# Patient Record
Sex: Female | Born: 1955 | Race: Black or African American | Hispanic: No | Marital: Single | Smoking: Former smoker
Health system: Southern US, Community
[De-identification: ages and names within clinical notes are randomized; demographics above are authoritative.]

## PROBLEM LIST (undated history)

## (undated) DIAGNOSIS — F329 Major depressive disorder, single episode, unspecified: Secondary | ICD-10-CM

## (undated) DIAGNOSIS — E7800 Pure hypercholesterolemia, unspecified: Secondary | ICD-10-CM

## (undated) DIAGNOSIS — I10 Essential (primary) hypertension: Secondary | ICD-10-CM

## (undated) DIAGNOSIS — R109 Unspecified abdominal pain: Secondary | ICD-10-CM

## (undated) DIAGNOSIS — G35D Multiple sclerosis, unspecified: Secondary | ICD-10-CM

## (undated) DIAGNOSIS — F32A Depression, unspecified: Secondary | ICD-10-CM

## (undated) DIAGNOSIS — G35 Multiple sclerosis: Secondary | ICD-10-CM

## (undated) HISTORY — DX: Essential (primary) hypertension: I10

## (undated) HISTORY — DX: Multiple sclerosis: G35

## (undated) HISTORY — DX: Depression, unspecified: F32.A

## (undated) HISTORY — DX: Pure hypercholesterolemia, unspecified: E78.00

## (undated) HISTORY — DX: Major depressive disorder, single episode, unspecified: F32.9

## (undated) HISTORY — DX: Multiple sclerosis, unspecified: G35.D

---

## 1996-11-25 HISTORY — PX: BREAST REDUCTION SURGERY: SHX8

## 1998-03-10 ENCOUNTER — Ambulatory Visit (HOSPITAL_COMMUNITY): Admission: RE | Admit: 1998-03-10 | Discharge: 1998-03-10 | Payer: Self-pay | Admitting: *Deleted

## 1999-05-17 ENCOUNTER — Emergency Department (HOSPITAL_COMMUNITY): Admission: EM | Admit: 1999-05-17 | Discharge: 1999-05-17 | Payer: Self-pay | Admitting: Emergency Medicine

## 1999-11-04 ENCOUNTER — Emergency Department (HOSPITAL_COMMUNITY): Admission: EM | Admit: 1999-11-04 | Discharge: 1999-11-04 | Payer: Self-pay | Admitting: Emergency Medicine

## 1999-11-15 ENCOUNTER — Encounter: Payer: Self-pay | Admitting: *Deleted

## 1999-11-15 ENCOUNTER — Ambulatory Visit (HOSPITAL_COMMUNITY): Admission: RE | Admit: 1999-11-15 | Discharge: 1999-11-15 | Payer: Self-pay | Admitting: *Deleted

## 2000-06-30 ENCOUNTER — Ambulatory Visit (HOSPITAL_COMMUNITY): Admission: RE | Admit: 2000-06-30 | Discharge: 2000-06-30 | Payer: Self-pay | Admitting: Internal Medicine

## 2000-07-30 ENCOUNTER — Ambulatory Visit (HOSPITAL_COMMUNITY): Admission: RE | Admit: 2000-07-30 | Discharge: 2000-07-30 | Payer: Self-pay | Admitting: Gastroenterology

## 2001-07-03 ENCOUNTER — Encounter: Payer: Self-pay | Admitting: Internal Medicine

## 2001-07-03 ENCOUNTER — Ambulatory Visit (HOSPITAL_COMMUNITY): Admission: RE | Admit: 2001-07-03 | Discharge: 2001-07-03 | Payer: Self-pay | Admitting: Internal Medicine

## 2001-07-21 ENCOUNTER — Other Ambulatory Visit: Admission: RE | Admit: 2001-07-21 | Discharge: 2001-07-21 | Payer: Self-pay | Admitting: Internal Medicine

## 2002-08-03 ENCOUNTER — Encounter: Admission: RE | Admit: 2002-08-03 | Discharge: 2002-08-25 | Payer: Self-pay | Admitting: Neurology

## 2003-06-30 ENCOUNTER — Other Ambulatory Visit: Admission: RE | Admit: 2003-06-30 | Discharge: 2003-06-30 | Payer: Self-pay | Admitting: Obstetrics and Gynecology

## 2005-05-20 ENCOUNTER — Ambulatory Visit (HOSPITAL_COMMUNITY): Admission: RE | Admit: 2005-05-20 | Discharge: 2005-05-20 | Payer: Self-pay

## 2005-07-18 ENCOUNTER — Encounter: Admission: RE | Admit: 2005-07-18 | Discharge: 2005-10-16 | Payer: Self-pay | Admitting: Neurology

## 2007-11-10 ENCOUNTER — Ambulatory Visit: Payer: Self-pay | Admitting: Psychology

## 2007-11-10 ENCOUNTER — Encounter: Admission: RE | Admit: 2007-11-10 | Discharge: 2007-11-10 | Payer: Self-pay | Admitting: Neurology

## 2008-01-13 ENCOUNTER — Ambulatory Visit: Payer: Self-pay | Admitting: Vascular Surgery

## 2011-04-05 ENCOUNTER — Other Ambulatory Visit: Payer: Self-pay | Admitting: Gastroenterology

## 2011-04-05 DIAGNOSIS — Q438 Other specified congenital malformations of intestine: Secondary | ICD-10-CM

## 2011-04-10 ENCOUNTER — Other Ambulatory Visit: Payer: Self-pay | Admitting: Gastroenterology

## 2011-04-10 ENCOUNTER — Ambulatory Visit
Admission: RE | Admit: 2011-04-10 | Discharge: 2011-04-10 | Disposition: A | Discharge status: Home / Self Care | Payer: Medicare Other | Source: Ambulatory Visit | Attending: Gastroenterology | Admitting: Gastroenterology

## 2011-04-10 DIAGNOSIS — Q438 Other specified congenital malformations of intestine: Secondary | ICD-10-CM

## 2011-04-12 NOTE — Procedures (Signed)
[place]. [facility]  Patient:    Mary Villanueva, Mary Villanueva                  MRN: [id number] Proc. Date: 07/30/00 Adm. Date:  [id number] Attending:  Charna Elizabeth CC:         Kern Reap, M.D.   Procedure Report  DATE OF BIRTH:  [birth year]  REFERRING PHYSICIAN:  Kern Reap, M.D.  PROCEDURE PERFORMED:  Esophagogastroduodenoscopy.  ENDOSCOPIST:  Anselmo Rod, M.D.  INSTRUMENT USED:  Olympus video panendoscope.  INDICATIONS FOR PROCEDURE:  Guaiac positive stool in a 55 year old black female rule out peptic ulcer disease, esophagitis, gastritis, etc.  PREPROCEDURE PREPARATION:  Informed consent was procured from the patient. The patient was fasted for eight hours prior to the procedure.  PREPROCEDURE PHYSICAL:  The patient had stable vital signs.  Neck supple. Chest clear to auscultation.  S1, S2 regular.  Abdomen soft with normal abdominal bowel sounds.  DESCRIPTION OF PROCEDURE:  The patient was placed in left lateral decubitus position and sedated with 50 mg of Demerol and 5 mg of Versed intravenously. Once the patient was adequately sedated and maintained on low-flow oxygen and continuous cardiac monitoring, the Olympus video panendoscope was advanced through the mouthpiece, over the tongue, into the esophagus under direct vision.  The entire esophagus appeared normal without evidence of ring, stricture, masses, lesions, esophagitis or Barretts mucosa.  The scope was then advanced into the stomach.  Except for mild antral gastritis and mild duodenitis in the duodenal bulb, no other abnormalities were seen.  The patient tolerated the procedure well without complications.  IMPRESSION:  Normal esophagogastroduodenoscopy except for mild antral gastritis and mild duodenitis in the duodenal bulb.  RECOMMENDATION: 1. Proceed with colonoscopy at this time. 2. Avoid all nonsteroidals. DD:  07/30/00 TD:  07/31/00 Job: [id number]

## 2011-04-12 NOTE — Procedures (Signed)
[place]. [facility]  Patient:    Mary Villanueva, Mary Villanueva                  MRN: [id number] Proc. Date: 07/30/00 Adm. Date:  [id number] Attending:  Charna Elizabeth CC:         Kern Reap, M.D.   Procedure Report  DATE OF BIRTH:  [birth year]  REFERRING PHYSICIAN:  Kern Reap, M.D.  PROCEDURE PERFORMED:  Colonoscopy.  ENDOSCOPIST:  Anselmo Rod, M.D.  INSTRUMENT USED:  Olympus video colonoscope.  INDICATIONS FOR PROCEDURE:  Guaiac positive stool in a 55 year old black female rule out colonic polyps, masses, hemorrhoids, etc.  PREPROCEDURE PREPARATION:  Informed consent was procured from the patient. The patient was fasted for eight hours prior to the procedure and prepped with a bottle of magnesium citrate and a gallon of NuLytely the night prior to the procedure.  PREPROCEDURE PHYSICAL:  The patient had stable vital signs.  Neck supple. Chest clear to auscultation.  S1, S2 regular.  No murmur, rub or gallop.  No rales, rhonchi or wheezing.  Abdomen soft with normal abdominal bowel sounds.  DESCRIPTION OF PROCEDURE:  The patient was placed in the left lateral decubitus position and sedated with an additional 1 mg of Versed intravenously.  Once the patient was adequately sedated and maintained on low-flow oxygen and continuous cardiac monitoring, the Olympus video colonoscope was advanced from the rectum to the cecum without difficulty.  No masses, polyps, erosions, ulcerations or diverticula were seen.  The patient had no evidence of hemorrhoids on retroflexion.  The patient tolerated the procedure well without complications.  The procedure was ____________ cecum. The ileocecal valve and the appendicular orifice were clearly visualized.  IMPRESSION:  Normal colonoscopy.  RECOMMENDATIONS:  Repeat guaiac testing will be done on an outpatient basis. Further recommendations will be made depending on results.  A small bowel follow-through  will be done if patient continues to have guaiac positive stools.DD:  07/30/00 TD:  07/31/00 Job: [id number]

## 2011-08-19 ENCOUNTER — Ambulatory Visit: Payer: Medicare Other | Attending: Neurology | Admitting: Physical Therapy

## 2011-08-19 DIAGNOSIS — IMO0001 Reserved for inherently not codable concepts without codable children: Secondary | ICD-10-CM | POA: Insufficient documentation

## 2011-08-19 DIAGNOSIS — R269 Unspecified abnormalities of gait and mobility: Secondary | ICD-10-CM | POA: Insufficient documentation

## 2011-08-19 DIAGNOSIS — M6281 Muscle weakness (generalized): Secondary | ICD-10-CM | POA: Insufficient documentation

## 2011-08-26 ENCOUNTER — Ambulatory Visit: Payer: Medicare Other | Attending: Neurology | Admitting: Physical Therapy

## 2011-08-26 DIAGNOSIS — R269 Unspecified abnormalities of gait and mobility: Secondary | ICD-10-CM | POA: Insufficient documentation

## 2011-08-26 DIAGNOSIS — M6281 Muscle weakness (generalized): Secondary | ICD-10-CM | POA: Insufficient documentation

## 2011-08-26 DIAGNOSIS — IMO0001 Reserved for inherently not codable concepts without codable children: Secondary | ICD-10-CM | POA: Insufficient documentation

## 2011-08-30 ENCOUNTER — Ambulatory Visit: Payer: Medicare Other | Admitting: Physical Therapy

## 2011-09-03 ENCOUNTER — Ambulatory Visit: Payer: Medicare Other | Admitting: Physical Therapy

## 2011-09-06 ENCOUNTER — Ambulatory Visit: Payer: Medicare Other | Admitting: Physical Therapy

## 2011-09-11 ENCOUNTER — Ambulatory Visit: Payer: Medicare Other | Admitting: Physical Therapy

## 2011-09-18 ENCOUNTER — Ambulatory Visit: Payer: Medicare Other | Admitting: Physical Therapy

## 2013-03-10 ENCOUNTER — Other Ambulatory Visit: Payer: Self-pay

## 2013-03-10 MED ORDER — FLUOXETINE HCL 20 MG PO CAPS: 20.0000 mg | ORAL_CAPSULE | Freq: Every day | ORAL | Status: DC

## 2013-03-10 MED ORDER — TIZANIDINE HCL 2 MG PO TABS: 2.0000 mg | ORAL_TABLET | Freq: Three times a day (TID) | ORAL | Status: DC

## 2013-03-10 MED ORDER — FLUOXETINE HCL 40 MG PO CAPS: 40.0000 mg | ORAL_CAPSULE | Freq: Every day | ORAL | Status: DC

## 2013-03-10 NOTE — Telephone Encounter (Signed)
Former [facility] patient assigned to Dr Yan  

## 2013-03-17 ENCOUNTER — Other Ambulatory Visit: Payer: Self-pay | Admitting: Nurse Practitioner

## 2013-03-17 DIAGNOSIS — R51 Headache: Secondary | ICD-10-CM

## 2013-03-19 ENCOUNTER — Ambulatory Visit: Payer: Self-pay | Admitting: Neurology

## 2013-03-20 ENCOUNTER — Ambulatory Visit
Admission: RE | Admit: 2013-03-20 | Discharge: 2013-03-20 | Disposition: A | Discharge status: Home / Self Care | Payer: Medicare Other | Source: Ambulatory Visit | Attending: Nurse Practitioner | Admitting: Nurse Practitioner

## 2013-03-20 DIAGNOSIS — R51 Headache: Secondary | ICD-10-CM

## 2013-03-22 ENCOUNTER — Ambulatory Visit (INDEPENDENT_AMBULATORY_CARE_PROVIDER_SITE_OTHER): Payer: Medicare Other | Admitting: Neurology

## 2013-03-22 ENCOUNTER — Encounter: Payer: Self-pay | Admitting: Neurology

## 2013-03-22 VITALS — BP 109/53 | HR 82 | Ht 62.0 in | Wt 143.4 lb

## 2013-03-22 DIAGNOSIS — G35D Multiple sclerosis, unspecified: Secondary | ICD-10-CM | POA: Insufficient documentation

## 2013-03-22 DIAGNOSIS — G35 Multiple sclerosis: Secondary | ICD-10-CM | POA: Insufficient documentation
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
## 2013-04-13 ENCOUNTER — Encounter (HOSPITAL_COMMUNITY): Payer: Self-pay | Admitting: Emergency Medicine

## 2013-04-13 ENCOUNTER — Emergency Department (HOSPITAL_COMMUNITY)
Admission: EM | Admit: 2013-04-13 | Discharge: 2013-04-13 | Disposition: A | Discharge status: Home / Self Care | Payer: Medicare Other | Attending: Emergency Medicine | Admitting: Emergency Medicine

## 2013-04-13 DIAGNOSIS — I10 Essential (primary) hypertension: Secondary | ICD-10-CM | POA: Insufficient documentation

## 2013-04-13 DIAGNOSIS — E7800 Pure hypercholesterolemia, unspecified: Secondary | ICD-10-CM | POA: Insufficient documentation

## 2013-04-13 DIAGNOSIS — Z79899 Other long term (current) drug therapy: Secondary | ICD-10-CM | POA: Insufficient documentation

## 2013-04-13 DIAGNOSIS — M79605 Pain in left leg: Secondary | ICD-10-CM

## 2013-04-13 DIAGNOSIS — IMO0001 Reserved for inherently not codable concepts without codable children: Secondary | ICD-10-CM | POA: Insufficient documentation

## 2013-04-13 DIAGNOSIS — M7989 Other specified soft tissue disorders: Secondary | ICD-10-CM | POA: Insufficient documentation

## 2013-04-13 DIAGNOSIS — M79609 Pain in unspecified limb: Secondary | ICD-10-CM | POA: Insufficient documentation

## 2013-04-13 DIAGNOSIS — Z87891 Personal history of nicotine dependence: Secondary | ICD-10-CM | POA: Insufficient documentation

## 2013-04-13 DIAGNOSIS — F3289 Other specified depressive episodes: Secondary | ICD-10-CM | POA: Insufficient documentation

## 2013-04-13 DIAGNOSIS — F329 Major depressive disorder, single episode, unspecified: Secondary | ICD-10-CM | POA: Insufficient documentation

## 2013-04-13 HISTORY — DX: Pure hypercholesterolemia, unspecified: E78.00

## 2013-04-13 LAB — BASIC METABOLIC PANEL
CO2: 26 mEq/L (ref 19–32)
GFR calc non Af Amer: 55 mL/min — ABNORMAL LOW (ref >90)
Glucose, Bld: 99 mg/dL (ref 70–99)
Potassium: 4.1 mEq/L (ref 3.5–5.1)
Sodium: 138 mEq/L (ref 135–145)

## 2013-04-13 LAB — CBC WITH DIFFERENTIAL/PLATELET
Lymphocytes Relative: 25 % (ref 12–46)
Lymphs Abs: 1 10*3/uL (ref 0.7–4.0)
Neutrophils Relative %: 67 % (ref 43–77)
Platelets: 219 10*3/uL (ref 150–400)
RBC: 3.74 MIL/uL — ABNORMAL LOW (ref 3.87–5.11)
WBC: 4.2 10*3/uL (ref 4.0–10.5)

## 2013-04-13 NOTE — Progress Notes (Signed)
*  Preliminary Results* Left lower extremity venous duplex completed. Left lower extremity is negative for deep vein thrombosis. There is no evidence of left Baker's cyst.  04/13/2013 11:34 AM Gertie Fey, RDMS, RDCS

## 2013-04-13 NOTE — ED Provider Notes (Signed)
History     CSN: [id number]  Arrival date & time 04/13/13  0901   First MD Initiated Contact with Patient 04/13/13 [phone]      Chief Complaint  Patient presents with  . Leg Pain  . Leg Swelling    (Consider location/radiation/quality/duration/timing/severity/associated sxs/prior treatment) HPI Comments: Patient is a 57 year old female with a past medical history of MS who presents with a 3 day history of left leg pain, mostly in her left calf. Symptoms started gradually and progressively worsened since the onset. The pain is cramping and severe without radiation. Patient reports leg spasms in the affected leg since the onset. No aggravating/alleviating factors. Patient denies any leg trauma, recent surgery, recent travel or history of DVT/PE.    Past Medical History  Diagnosis Date  . Hypertension   . Depression   . High cholesterol   . Hypercholesteremia     Past Surgical History  Procedure Laterality Date  . Breast reduction surgery  1998    Family History  Problem Relation Age of Onset  . Stroke Mother     History  Substance Use Topics  . Smoking status: Former Games developer  . Smokeless tobacco: Never Used  . Alcohol Use: Yes     Comment: ralely    OB History   Grav Para Term Preterm Abortions TAB SAB Ect Mult Living                  Review of Systems  Musculoskeletal: Positive for myalgias.  All other systems reviewed and are negative.    Allergies  Review of patient's allergies indicates no known allergies.  Home Medications   Current Outpatient Rx  Name  Route  Sig  Dispense  Refill  . amLODipine (NORVASC) 2.5 MG tablet   Oral   Take 2.5 mg by mouth daily.         Marland Kitchen buPROPion (WELLBUTRIN) 75 MG tablet   Oral   Take 75 mg by mouth daily.         . calcium carbonate (OS-CAL) 600 MG TABS   Oral   Take 600 mg by mouth 2 (two) times daily with a meal.         . Cholecalciferol (VITAMIN D-3) 1000 UNITS CAPS   Oral   Take 1,000 Units by mouth  2 (two) times daily.         Marland Kitchen darifenacin (ENABLEX) 15 MG 24 hr tablet   Oral   Take 15 mg by mouth daily.         Marland Kitchen FLUoxetine (PROZAC) 20 MG capsule   Oral   Take 1 capsule (20 mg total) by mouth daily. With 40mg    90 capsule   1   . FLUoxetine (PROZAC) 40 MG capsule   Oral   Take 1 capsule (40 mg total) by mouth daily. With 20mg    90 capsule   1   . levothyroxine (SYNTHROID) 25 MCG tablet   Oral   Take 25 mcg by mouth daily before breakfast.         . Multiple Vitamin (MULTIVITAMIN) capsule   Oral   Take 1 capsule by mouth daily.         . Omega-3 Fatty Acids (FISH OIL) 1200 MG CAPS   Oral   Take 1,200 mg by mouth daily.         . simvastatin (ZOCOR) 20 MG tablet   Oral   Take 20 mg by mouth every evening.         Marland Kitchen  solifenacin (VESICARE) 10 MG tablet   Oral   Take 10 mg by mouth daily.         Marland Kitchen tiZANidine (ZANAFLEX) 2 MG tablet   Oral   Take 1 tablet (2 mg total) by mouth 3 (three) times daily.   270 tablet   1   . valsartan (DIOVAN) 320 MG tablet   Oral   Take 320 mg by mouth daily.         . vitamin B-12 (CYANOCOBALAMIN) 500 MCG tablet   Oral   Take 500 mcg by mouth daily.           BP 95/41  Pulse 90  Temp(Src) 98.5 F (36.9 C) (Oral)  Resp 18  Ht 5\' 2"  (1.575 m)  Wt 140 lb (63.504 kg)  BMI 25.6 kg/m2  SpO2 95%  Physical Exam  Nursing note and vitals reviewed. Constitutional: She is oriented to person, place, and time. She appears well-developed and well-nourished. No distress.  HENT:  Head: Normocephalic and atraumatic.  Eyes: Conjunctivae are normal.  Neck: Normal range of motion.  Cardiovascular: Normal rate and regular rhythm.  Exam reveals no gallop and no friction rub.   No murmur heard. Pulmonary/Chest: Effort normal and breath sounds normal. She has no wheezes. She has no rales. She exhibits no tenderness.  Abdominal: Soft. She exhibits no distension. There is no tenderness. There is no rebound and no  guarding.  Musculoskeletal: Normal range of motion.  Left lower leg 1+ pitting edema. Dark discoloration of left lower leg. No calf or popliteal tenderness to palpation.   Neurological: She is alert and oriented to person, place, and time. Coordination normal.  Extremity strength and sensation equal and intact bilaterally. Speech is goal-oriented. Moves limbs without ataxia.   Skin: Skin is warm and dry.  Psychiatric: She has a normal mood and affect. Her behavior is normal.    ED Course  Procedures (including critical care time)  Labs Reviewed  CBC WITH DIFFERENTIAL - Abnormal; Notable for the following:    RBC 3.74 (*)    Hemoglobin 11.8 (*)    HCT 34.1 (*)    All other components within normal limits  BASIC METABOLIC PANEL - Abnormal; Notable for the following:    GFR calc non Af Amer 55 (*)    GFR calc Af Amer 63 (*)    All other components within normal limits   No results found.   1. Left leg pain       MDM  10:25 AM Labs pending. Patient will have LE DVT study. Patient declines pain medication at this time.   1:45 PM Labs unremarkable for acute changes. Vascular study unremarkable for DVT. Patient will be discharged with instructions to follow up with her neurologist for further evaluation and management. Vitals stable and patient is afebrile. Patient instructed to return with worsening or concerning symptoms.        Emilia Beck, PA-C 04/13/13 1348

## 2013-04-13 NOTE — ED Notes (Signed)
Family at bedside. 

## 2013-04-13 NOTE — ED Notes (Signed)
Pt c/o left leg pain and swelling from spasms; pt sts hx of MS and has spasms with that

## 2013-04-14 NOTE — ED Provider Notes (Signed)
Medical screening examination/treatment/procedure(s) were conducted as a shared visit with non-physician practitioner(s) and myself.  I personally evaluated the patient during the encounter Pt with hx MS c/o left leg spasms, intermittently. ?slight swelling. No erythema. No focal bony tenderness. Distal pulses palp.   Suzi Roots, MD 04/14/13 1344

## 2013-04-21 ENCOUNTER — Ambulatory Visit (INDEPENDENT_AMBULATORY_CARE_PROVIDER_SITE_OTHER): Payer: Medicare Other | Admitting: Neurology

## 2013-04-21 ENCOUNTER — Encounter: Payer: Self-pay | Admitting: Neurology

## 2013-04-21 VITALS — Ht 62.0 in | Wt 139.0 lb

## 2013-04-21 DIAGNOSIS — G35 Multiple sclerosis: Secondary | ICD-10-CM

## 2013-04-21 DIAGNOSIS — F329 Major depressive disorder, single episode, unspecified: Secondary | ICD-10-CM | POA: Insufficient documentation

## 2013-04-21 DIAGNOSIS — E7800 Pure hypercholesterolemia, unspecified: Secondary | ICD-10-CM

## 2013-04-21 DIAGNOSIS — I10 Essential (primary) hypertension: Secondary | ICD-10-CM

## 2013-04-22 DIAGNOSIS — G8220 Paraplegia, unspecified: Secondary | ICD-10-CM

## 2013-04-22 MED ORDER — ONABOTULINUMTOXINA 100 UNITS IJ SOLR
250.0000 [IU] | Freq: Once | INTRAMUSCULAR | Status: AC
  Administered 2013-04-22: 250 [IU] via INTRAMUSCULAR
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
## 2013-07-01 DIAGNOSIS — Z0289 Encounter for other administrative examinations: Secondary | ICD-10-CM

## 2013-07-21 ENCOUNTER — Telehealth: Payer: Self-pay

## 2013-07-21 NOTE — Telephone Encounter (Signed)
I called patient and reviewed document with her. We discussed all areas of functional capabilities for correctness. I thanked patient for her time and let her know, Dr. Terrace Arabia will review and sign form tomorrow and then we will fax it out when that is done.

## 2013-07-28 ENCOUNTER — Encounter: Payer: Self-pay | Admitting: Neurology

## 2013-07-28 ENCOUNTER — Ambulatory Visit (INDEPENDENT_AMBULATORY_CARE_PROVIDER_SITE_OTHER): Payer: Medicare Other | Admitting: Neurology

## 2013-07-28 VITALS — BP 101/62 | HR 90 | Ht 62.0 in | Wt 145.0 lb

## 2013-07-28 DIAGNOSIS — G8220 Paraplegia, unspecified: Secondary | ICD-10-CM

## 2013-07-28 DIAGNOSIS — G35 Multiple sclerosis: Secondary | ICD-10-CM

## 2013-07-28 MED ORDER — ONABOTULINUMTOXINA 100 UNITS IJ SOLR
250.0000 [IU] | Freq: Once | INTRAMUSCULAR | Status: AC
  Administered 2013-07-28: 250 [IU] via INTRAMUSCULAR
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
## 2013-08-16 ENCOUNTER — Ambulatory Visit: Payer: Medicare Other | Admitting: Neurology

## 2013-08-27 ENCOUNTER — Other Ambulatory Visit: Payer: Self-pay | Admitting: Neurology

## 2013-09-19 ENCOUNTER — Other Ambulatory Visit: Payer: Self-pay | Admitting: Neurology

## 2013-11-03 ENCOUNTER — Encounter (INDEPENDENT_AMBULATORY_CARE_PROVIDER_SITE_OTHER): Payer: Self-pay

## 2013-11-03 ENCOUNTER — Encounter: Payer: Self-pay | Admitting: Neurology

## 2013-11-03 ENCOUNTER — Ambulatory Visit (INDEPENDENT_AMBULATORY_CARE_PROVIDER_SITE_OTHER): Payer: Medicare Other | Admitting: Neurology

## 2013-11-03 VITALS — BP 113/58 | HR 95 | Ht 62.0 in | Wt 144.5 lb

## 2013-11-03 DIAGNOSIS — G8220 Paraplegia, unspecified: Secondary | ICD-10-CM

## 2013-11-03 DIAGNOSIS — G35 Multiple sclerosis: Secondary | ICD-10-CM

## 2013-11-03 MED ORDER — ONABOTULINUMTOXINA 100 UNITS IJ SOLR
400.0000 [IU] | Freq: Once | INTRAMUSCULAR | Status: AC
  Administered 2013-11-03: 400 [IU] via INTRAMUSCULAR

## 2013-11-03 MED ORDER — TIZANIDINE HCL 4 MG PO TABS: 4.0000 mg | ORAL_TABLET | Freq: Three times a day (TID) | ORAL | Status: DC

## 2013-11-03 NOTE — Addendum Note (Signed)
Addended by: Levert Feinstein on: 11/03/2013 03:59 PM   Modules accepted: Orders
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
## 2013-11-30 ENCOUNTER — Telehealth: Payer: Self-pay | Admitting: Neurology

## 2013-11-30 DIAGNOSIS — E7800 Pure hypercholesterolemia, unspecified: Secondary | ICD-10-CM

## 2013-11-30 DIAGNOSIS — I10 Essential (primary) hypertension: Secondary | ICD-10-CM

## 2013-11-30 DIAGNOSIS — G35 Multiple sclerosis: Secondary | ICD-10-CM

## 2013-11-30 NOTE — Telephone Encounter (Signed)
Patient is experiencing some weakness in her right foot. She wants to know if it is a side effect of the Botox and/or something she should be concerned over. Please call to advise. If calling tomorrow use this number [phone]

## 2013-12-01 NOTE — Telephone Encounter (Signed)
I have called her, she complains of worsening right leg weakness, gait difficulty since her most recent Botox injection for bilateral lower extremity spasticity, I did inject her right distal leg muscles, hoping to improve her right foot plantar flexion,  If her increased weakness is from Botox injection, wearing of 810 weeks after injection, which was performed December tenth 2014,  I will also refer her to home health physical therapy for gait training,

## 2013-12-01 NOTE — Telephone Encounter (Signed)
Left message for patient at [phone], to get further information.  Told her I would forward to the doctor for her advise.  [phone].

## 2013-12-27 ENCOUNTER — Encounter: Payer: Self-pay | Admitting: Podiatry

## 2013-12-27 ENCOUNTER — Ambulatory Visit (INDEPENDENT_AMBULATORY_CARE_PROVIDER_SITE_OTHER): Payer: Medicare Other | Admitting: Podiatry

## 2013-12-27 ENCOUNTER — Ambulatory Visit (INDEPENDENT_AMBULATORY_CARE_PROVIDER_SITE_OTHER): Payer: Medicare Other

## 2013-12-27 VITALS — BP 132/78 | HR 81 | Resp 18

## 2013-12-27 DIAGNOSIS — M79609 Pain in unspecified limb: Secondary | ICD-10-CM

## 2013-12-27 DIAGNOSIS — S93409A Sprain of unspecified ligament of unspecified ankle, initial encounter: Secondary | ICD-10-CM

## 2013-12-27 NOTE — Patient Instructions (Addendum)
We are the ankle stabilizer on the right ankle daily. Return x6 weeks for evaluation  Ankle Sprain An ankle sprain is an injury to the strong, fibrous tissues (ligaments) that hold the bones of your ankle joint together.  CAUSES An ankle sprain is usually caused by a fall or by twisting your ankle. Ankle sprains most commonly occur when you step on the outer edge of your foot, and your ankle turns inward. People who participate in sports are more prone to these types of injuries.  SYMPTOMS   Pain in your ankle. The pain may be present at rest or only when you are trying to stand or walk.  Swelling.  Bruising. Bruising may develop immediately or within 1 to 2 days after your injury.  Difficulty standing or walking, particularly when turning corners or changing directions. DIAGNOSIS  Your caregiver will ask you details about your injury and perform a physical exam of your ankle to determine if you have an ankle sprain. During the physical exam, your caregiver will press on and apply pressure to specific areas of your foot and ankle. Your caregiver will try to move your ankle in certain ways. An X-ray exam may be done to be sure a bone was not broken or a ligament did not separate from one of the bones in your ankle (avulsion fracture).  TREATMENT  Certain types of braces can help stabilize your ankle. Your caregiver can make a recommendation for this. Your caregiver may recommend the use of medicine for pain. If your sprain is severe, your caregiver may refer you to a surgeon who helps to restore function to parts of your skeletal system (orthopedist) or a physical therapist. [place] ice to your injury for 1 2 days or as directed by your caregiver. Applying ice helps to reduce inflammation and pain.  Put ice in a plastic bag.  Place a towel between your skin and the bag.  Leave the ice on for 15-20 minutes at a time, every 2 hours while you are awake.  Only take  over-the-counter or prescription medicines for pain, discomfort, or fever as directed by your caregiver.  Elevate your injured ankle above the level of your heart as much as possible for 2 3 days.  If your caregiver recommends crutches, use them as instructed. Gradually put weight on the affected ankle. Continue to use crutches or a cane until you can walk without feeling pain in your ankle.  If you have a plaster splint, wear the splint as directed by your caregiver. Do not rest it on anything harder than a pillow for the first 24 hours. Do not put weight on it. Do not get it wet. You may take it off to take a shower or bath.  You may have been given an elastic bandage to wear around your ankle to provide support. If the elastic bandage is too tight (you have numbness or tingling in your foot or your foot becomes cold and blue), adjust the bandage to make it comfortable.  If you have an air splint, you may blow more air into it or let air out to make it more comfortable. You may take your splint off at night and before taking a shower or bath. Wiggle your toes in the splint several times per day to decrease swelling. SEEK MEDICAL CARE IF:   You have rapidly increasing bruising or swelling.  Your toes feel extremely cold or you lose feeling in your foot.  Your  pain is not relieved with medicine. SEEK IMMEDIATE MEDICAL CARE IF:  Your toes are numb or blue.  You have severe pain that is increasing. MAKE SURE YOU:   Understand these instructions.  Will watch your condition.  Will get help right away if you are not doing well or get worse. Document Released: 11/11/2005 Document Revised: 08/05/2012 Document Reviewed: 11/23/2011 [facility] Patient Information 2014 [place], [place]. [email or web address]

## 2013-12-27 NOTE — Progress Notes (Signed)
   Subjective:    Patient ID: Mary Villanueva, female    DOB: March 24, 1956, 58 y.o.   MRN: [id number]  HPI my ankles are weak on both feet and left foot is swelling and been going on for about 3 weeks for the weakness and the swelling for months and no injury noted and I do have neuropathy in both feet.  Patient presents complaining of right ankle more than left ankle weakness with weightbearing. There is no history of direct injury. 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  Review of Systems  Cardiovascular:       Hyertension  Neurological: Positive for numbness.       Feet   All other systems reviewed and are negative.       Objective:   Physical Exam  A pleasant 58 year old black female presents today orientated x3.  Vascular: The DP and PT pulses are 2/bilaterally  Dermatological: Texture and turgor within normal limits.  Musculoskeletal: Patient has a gait disturbance walking with a walker. Dorsi flexion plantar flexion right ankle was route without restriction in the right foot functions within medium to low arch contour. There is palpable tenderness on the medial right ankle in the retro-medial malleolar area.  The left foot/ankle is in a cavovarus position. Left ankle is rigid and fixed in a neutral position. Left ankle is nontender to palpation.   X-ray examination right ankle weightbearing  Right ankle joint space is within normal limits. No angular deformities are noted.  Radiographic impression: No acute bony normality noted right ankle       Assessment & Plan:   Assessment: Gait disturbance secondary to MS Weight transfer to right ankle resulting from gait disturbance and rigid cavovarus left foot/ ankle. Medial right ankle tendinopathy/sprain medial right ankle  Plan: An ankle stabilizer was dispensed to wear in the right ankle with very instructions provided.  Reappoint x6 weeks

## 2013-12-28 ENCOUNTER — Encounter: Payer: Self-pay | Admitting: Podiatry

## 2013-12-31 ENCOUNTER — Other Ambulatory Visit: Payer: Self-pay | Admitting: Chiropractic Medicine

## 2013-12-31 DIAGNOSIS — M5416 Radiculopathy, lumbar region: Secondary | ICD-10-CM

## 2013-12-31 DIAGNOSIS — M542 Cervicalgia: Secondary | ICD-10-CM

## 2014-01-14 ENCOUNTER — Telehealth: Payer: Self-pay | Admitting: Neurology

## 2014-01-14 NOTE — Telephone Encounter (Signed)
Message copied by Marcial Pacas on Fri Jan 14, 2014  6:02 PM ------      Message from: Danford Bad      Created: Thu Jan 06, 2014 10:26 AM       Could you please write me a letter of appeal for this patient her Medicare denied. I will get all the other information together I just need a letter from you as well. thanks ------

## 2014-02-02 ENCOUNTER — Encounter: Payer: Self-pay | Admitting: Neurology

## 2014-02-02 ENCOUNTER — Ambulatory Visit (INDEPENDENT_AMBULATORY_CARE_PROVIDER_SITE_OTHER): Payer: Medicare Other | Admitting: Neurology

## 2014-02-02 DIAGNOSIS — G8220 Paraplegia, unspecified: Secondary | ICD-10-CM

## 2014-02-02 DIAGNOSIS — G35 Multiple sclerosis: Secondary | ICD-10-CM
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
## 2014-02-07 ENCOUNTER — Ambulatory Visit (INDEPENDENT_AMBULATORY_CARE_PROVIDER_SITE_OTHER): Payer: Medicare Other | Admitting: Podiatry

## 2014-02-07 ENCOUNTER — Ambulatory Visit (INDEPENDENT_AMBULATORY_CARE_PROVIDER_SITE_OTHER): Payer: Medicare Other

## 2014-02-07 ENCOUNTER — Encounter: Payer: Self-pay | Admitting: Podiatry

## 2014-02-07 VITALS — BP 130/75 | HR 86 | Resp 18

## 2014-02-07 DIAGNOSIS — R52 Pain, unspecified: Secondary | ICD-10-CM

## 2014-02-07 DIAGNOSIS — M779 Enthesopathy, unspecified: Secondary | ICD-10-CM

## 2014-02-07 NOTE — Patient Instructions (Addendum)
Have blood test outpatient at [facility] within 24 hours

## 2014-02-08 ENCOUNTER — Encounter: Payer: Self-pay | Admitting: Podiatry

## 2014-02-08 LAB — C-REACTIVE PROTEIN: CRP: <0.5 mg/dL (ref <0.60)

## 2014-02-08 LAB — URIC ACID: Uric Acid, Serum: 4.4 mg/dL (ref 2.4–7.0)

## 2014-02-08 LAB — RHEUMATOID FACTOR: Rhuematoid fact SerPl-aCnc: <10 IU/mL (ref <=14)

## 2014-02-08 NOTE — Progress Notes (Signed)
Patient ID: Mary Villanueva, female   DOB: June 16, 1956, 58 y.o.   MRN: [id number] Subjective: This patient presents today stating that the ankle stabilizer that she is wearing on the right ankle as the visit of 12/28/2013 has reduced ov lateral right ankle pain.  She is now complaining of a swollen left ankle for approximately 3 weeks with persistent swelling and discomfort and is requesting evaluation.  Vascular: DP and PT pulses are 2/4 bilaterally  Dermatological: Mild pitting edema noted in the left ankle and dorsal aspect of the left foot. There is no calf edema or calf tenderness left.  Musculoskeletal: Cavovarus left foot with palpable tenderness over the lateral  ankle without a palpable lesions. Patient has spastic unstable gait. There is palpable tenderness in the lateral right ankle without a palpable lesions.  X-ray report weightbearing left ankle  Intact bony structure without a fracture and/or dislocation noted. The ankle mortise appears within normal limits. Increased soft tissue density noted in the ankle and foot. A cavovarus foot is noted on the lateral view with hammering of the lateral digits.  Radiographic impression: No acute bony abnormality noted  Assessment: Improving lateral right ankle pain associated with weight transfer from rigid cavovarus left foot. Spastic gait pattern associated with MS Edema left foot/ankle Rule out arthritic inflammation left ankle/foot  Plan: Continue to wear ankle stabilizer right Patient is referred to lab for arthritis panel including sedimentation rate, uric acid, RA latex fixation, C-reactive protein.

## 2014-02-09 LAB — ANA: Anti Nuclear Antibody(ANA): NEGATIVE

## 2014-02-09 LAB — SEDIMENTATION RATE: SED RATE: 6 mm/h (ref 0–22)

## 2014-02-21 ENCOUNTER — Ambulatory Visit (INDEPENDENT_AMBULATORY_CARE_PROVIDER_SITE_OTHER): Payer: Medicare Other | Admitting: Podiatry

## 2014-02-21 ENCOUNTER — Encounter: Payer: Self-pay | Admitting: Podiatry

## 2014-02-21 VITALS — BP 118/68 | HR 86 | Resp 12

## 2014-02-21 DIAGNOSIS — M779 Enthesopathy, unspecified: Secondary | ICD-10-CM

## 2014-02-21 NOTE — Patient Instructions (Signed)
Transfers the ankle stabilizer from the right ankle to the left ankle. Wear as needed. Also, wear a light grade knee length compression hose on the left leg until swelling resolves.

## 2014-02-22 NOTE — Progress Notes (Signed)
Patient ID: Mary Villanueva, female   DOB: Apr 22, 1956, 58 y.o.   MRN: [id number]  Subjective: Patient presents today stating right and left ankles are feeling better. She has been wearing an ankle stabilizer on the right ankle. There was edema in the left ankle which was evaluated for arthritic inflammation.  Objective: Arthritis panel of 02/07/2014 was within normal limits  The left ankle remains in a cavovarus position with mild nonpitting edema and tenderness over the lateral ankle area. There is no calf edema or calf tenderness left.  The right ankle is mildly tender to palpation without edema.  Assessment: Improving lateral right ankle pain Weight transfer and spastic gait associated with MS and cavovarus foot left Edema left ankle  Plan: Transfer ankle stabilizer from right ankle to the left ankle and wear as needed  light grade compression hose recommended for left ankle, and wear until edema is controlled

## 2014-03-13 ENCOUNTER — Other Ambulatory Visit: Payer: Self-pay | Admitting: Neurology

## 2014-03-20 ENCOUNTER — Other Ambulatory Visit: Payer: Self-pay | Admitting: Neurology

## 2014-05-04 ENCOUNTER — Ambulatory Visit (INDEPENDENT_AMBULATORY_CARE_PROVIDER_SITE_OTHER): Payer: Medicare Other | Admitting: Neurology

## 2014-05-04 ENCOUNTER — Encounter (INDEPENDENT_AMBULATORY_CARE_PROVIDER_SITE_OTHER): Payer: Self-pay

## 2014-05-04 ENCOUNTER — Encounter: Payer: Self-pay | Admitting: Neurology

## 2014-05-04 DIAGNOSIS — G8220 Paraplegia, unspecified: Secondary | ICD-10-CM

## 2014-05-04 DIAGNOSIS — F329 Major depressive disorder, single episode, unspecified: Secondary | ICD-10-CM

## 2014-05-04 DIAGNOSIS — G35 Multiple sclerosis: Secondary | ICD-10-CM

## 2014-05-04 DIAGNOSIS — F32A Depression, unspecified: Secondary | ICD-10-CM

## 2014-05-04 MED ORDER — ONABOTULINUMTOXINA 100 UNITS IJ SOLR
200.0000 [IU] | Freq: Once | INTRAMUSCULAR | Status: AC
  Administered 2014-05-04: 200 [IU] via INTRAMUSCULAR
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
## 2014-05-06 MED ORDER — ONABOTULINUMTOXINA 100 UNITS IJ SOLR
200.0000 [IU] | Freq: Once | INTRAMUSCULAR | Status: AC
  Administered 2014-05-06: 200 [IU] via INTRAMUSCULAR

## 2014-05-16 ENCOUNTER — Ambulatory Visit
Admission: RE | Admit: 2014-05-16 | Discharge: 2014-05-16 | Disposition: A | Discharge status: Home / Self Care | Payer: Medicare Other | Source: Ambulatory Visit | Attending: Neurology | Admitting: Neurology

## 2014-05-16 DIAGNOSIS — G35 Multiple sclerosis: Secondary | ICD-10-CM

## 2014-05-16 MED ORDER — GADOBENATE DIMEGLUMINE 529 MG/ML IV SOLN
13.0000 mL | Freq: Once | INTRAVENOUS | Status: AC | PRN
  Administered 2014-05-16: 13 mL via INTRAVENOUS

## 2014-05-17 ENCOUNTER — Telehealth: Payer: Self-pay | Admitting: Neurology

## 2014-05-17 NOTE — Telephone Encounter (Signed)
Mary Villanueva, Please call patient, MRI brain and cervical showed chronic MS lesions, no significant changes, compared to previous scans.

## 2014-05-18 NOTE — Telephone Encounter (Signed)
Spoke to patient and relayed MRI brain and cervical results, showing chronic MS lesions, no significant changes compared to previous scans, per Dr. Krista Blue.  Patient expressed understanding.

## 2014-07-20 ENCOUNTER — Encounter: Payer: Self-pay | Admitting: Podiatry

## 2014-07-20 ENCOUNTER — Ambulatory Visit (INDEPENDENT_AMBULATORY_CARE_PROVIDER_SITE_OTHER): Payer: Medicare Other | Admitting: Podiatry

## 2014-07-20 VITALS — BP 121/73 | HR 75 | Resp 18

## 2014-07-20 DIAGNOSIS — M216X9 Other acquired deformities of unspecified foot: Secondary | ICD-10-CM

## 2014-07-20 DIAGNOSIS — M79609 Pain in unspecified limb: Secondary | ICD-10-CM

## 2014-07-20 DIAGNOSIS — M79672 Pain in left foot: Secondary | ICD-10-CM

## 2014-07-20 DIAGNOSIS — L84 Corns and callosities: Secondary | ICD-10-CM

## 2014-07-20 NOTE — Progress Notes (Signed)
   Subjective:    Patient ID: Mary Villanueva, female    DOB: 12-14-1955, 58 y.o.   MRN: [id number]  HPI  58 year old female presents the office today with a painful callus on the plantar aspect of the left foot. She states she periodically trims the callus at home herself. She previously has been treated for pain in her right ankle for which she previously was ordered ankle stabilizer for. She states that currently her pain in her ankle has resolved and is currently asymptomatic. She does get chronic swelling to the left lower extremity for which she wears compression stockings for. She denies any acute changes. No other complaints at this time.     Review of Systems  All other systems reviewed and are negative.      Objective:   Physical Exam AAO x3, NAD DP/PT pulses palpable, CRT < 3 sec Hyperkeratotic lesion left submetatarsal 5 with tenderness to palpation directly over the hyperkeratotic lesion. No open lesions and no clinical signs of infection. Also hyperkeratotic lesion right medial hallux, asymptomatic.  Left foot and cavovarus position. Mild edema to the left lower extremity (chronic). No calf pain/warmth       Assessment & Plan:  58 year old female left hyperkeratotic lesion, pain to -Treatment options were discussed with patient in detail including alternatives, risks, complications. -Hyperkeratotic lesion left submetatarsal 5 sharply debrided without complications. -Offloading pads dispensed to the patient -Patient states that she did not want other hyperkeratotic lesions on her feet debrided as they were asymptomatic. -Followup as needed. Call with any questions or concerns or any changes symptoms.

## 2014-07-25 ENCOUNTER — Ambulatory Visit: Payer: Medicare Other | Admitting: Podiatry

## 2014-10-18 ENCOUNTER — Other Ambulatory Visit: Payer: Self-pay | Admitting: Neurology

## 2014-11-29 ENCOUNTER — Telehealth: Payer: Self-pay | Admitting: *Deleted

## 2014-11-29 DIAGNOSIS — Z0289 Encounter for other administrative examinations: Secondary | ICD-10-CM

## 2014-11-29 NOTE — Telephone Encounter (Signed)
Form on [employer].

## 2014-12-02 ENCOUNTER — Ambulatory Visit (INDEPENDENT_AMBULATORY_CARE_PROVIDER_SITE_OTHER): Payer: 59 | Admitting: Neurology

## 2014-12-02 ENCOUNTER — Encounter: Payer: Self-pay | Admitting: Neurology

## 2014-12-02 VITALS — BP 89/53 | HR 77 | Ht 62.0 in | Wt 134.0 lb

## 2014-12-02 DIAGNOSIS — G8220 Paraplegia, unspecified: Secondary | ICD-10-CM

## 2014-12-02 DIAGNOSIS — G809 Cerebral palsy, unspecified: Secondary | ICD-10-CM | POA: Diagnosis not present

## 2014-12-02 DIAGNOSIS — G35 Multiple sclerosis: Secondary | ICD-10-CM

## 2014-12-02 DIAGNOSIS — G8389 Other specified paralytic syndromes: Secondary | ICD-10-CM | POA: Diagnosis not present

## 2014-12-02 MED ORDER — MODAFINIL 100 MG PO TABS: 100.0000 mg | ORAL_TABLET | Freq: Every day | ORAL | Status: DC
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
## 2014-12-06 DIAGNOSIS — E559 Vitamin D deficiency, unspecified: Secondary | ICD-10-CM | POA: Diagnosis not present

## 2014-12-06 DIAGNOSIS — E784 Other hyperlipidemia: Secondary | ICD-10-CM | POA: Diagnosis not present

## 2014-12-06 DIAGNOSIS — E039 Hypothyroidism, unspecified: Secondary | ICD-10-CM | POA: Diagnosis not present

## 2014-12-06 DIAGNOSIS — I10 Essential (primary) hypertension: Secondary | ICD-10-CM | POA: Diagnosis not present

## 2014-12-09 ENCOUNTER — Encounter: Payer: Self-pay | Admitting: Neurology

## 2014-12-13 ENCOUNTER — Encounter: Payer: Self-pay | Admitting: Neurology

## 2014-12-15 NOTE — Telephone Encounter (Signed)
Chart reviewed, last EMG guided Botox injection was in June 2015, I will clarify the patient's medication list before proceed with [facility] screen.

## 2014-12-23 ENCOUNTER — Telehealth: Payer: Self-pay | Admitting: Neurology

## 2014-12-23 NOTE — Telephone Encounter (Signed)
Patient is calling to follow up on Disability Forms that were to be filled out by Dr. Krista Blue. Please call patient and advise. Thank you.

## 2014-12-23 NOTE — Telephone Encounter (Signed)
Insurance company is telling the patient they have not received the forms yet.  Please re-fax and call the patient to let her know it has been completed.

## 2014-12-26 NOTE — Telephone Encounter (Signed)
Thank you for taking care of this.  The patient wanted a call back after the paperwork has been faxed to her insurance company.

## 2014-12-27 NOTE — Telephone Encounter (Signed)
Patient calling stating that someone told her a part of her Disability form was missing. Patient wanted to know which part is missing and how to get it to the office.

## 2014-12-30 ENCOUNTER — Telehealth: Payer: Self-pay | Admitting: *Deleted

## 2014-12-30 NOTE — Telephone Encounter (Signed)
Form fax to [facility] on 12/30/14.

## 2015-01-03 DIAGNOSIS — Z0000 Encounter for general adult medical examination without abnormal findings: Secondary | ICD-10-CM | POA: Diagnosis not present

## 2015-01-03 DIAGNOSIS — Z79899 Other long term (current) drug therapy: Secondary | ICD-10-CM | POA: Diagnosis not present

## 2015-01-03 DIAGNOSIS — I10 Essential (primary) hypertension: Secondary | ICD-10-CM | POA: Diagnosis not present

## 2015-01-03 DIAGNOSIS — E039 Hypothyroidism, unspecified: Secondary | ICD-10-CM | POA: Diagnosis not present

## 2015-01-03 DIAGNOSIS — E784 Other hyperlipidemia: Secondary | ICD-10-CM | POA: Diagnosis not present

## 2015-02-02 NOTE — Telephone Encounter (Signed)
Form was refax to insurance company.

## 2015-02-27 DIAGNOSIS — Z1231 Encounter for screening mammogram for malignant neoplasm of breast: Secondary | ICD-10-CM | POA: Diagnosis not present

## 2015-03-15 ENCOUNTER — Telehealth: Payer: Self-pay | Admitting: Neurology

## 2015-03-15 NOTE — Telephone Encounter (Signed)
I spoke to the patient about her interest in being a volunteer for the [employer] on Friday, [id number] at 14:30h. I explained that the training was going to last 60 minutes. Patient is willing to participate.

## 2015-03-15 NOTE — Telephone Encounter (Signed)
Patient called/returning Dr. Rhea Belton call. Pt's c/b [phone]

## 2015-03-15 NOTE — Telephone Encounter (Signed)
Need to call her for spasticity training.

## 2015-04-28 ENCOUNTER — Other Ambulatory Visit: Payer: Self-pay | Admitting: Neurology

## 2015-06-02 ENCOUNTER — Ambulatory Visit: Payer: 59 | Admitting: Neurology

## 2015-06-05 ENCOUNTER — Encounter: Payer: Self-pay | Admitting: Neurology

## 2015-06-05 ENCOUNTER — Ambulatory Visit (INDEPENDENT_AMBULATORY_CARE_PROVIDER_SITE_OTHER): Payer: Medicare Other | Admitting: Neurology

## 2015-06-05 ENCOUNTER — Telehealth: Payer: Self-pay | Admitting: Neurology

## 2015-06-05 VITALS — BP 114/69 | HR 70 | Ht 62.0 in | Wt 136.0 lb

## 2015-06-05 DIAGNOSIS — G8389 Other specified paralytic syndromes: Secondary | ICD-10-CM

## 2015-06-05 DIAGNOSIS — G35 Multiple sclerosis: Secondary | ICD-10-CM

## 2015-06-05 DIAGNOSIS — F329 Major depressive disorder, single episode, unspecified: Secondary | ICD-10-CM

## 2015-06-05 DIAGNOSIS — G809 Cerebral palsy, unspecified: Secondary | ICD-10-CM

## 2015-06-05 DIAGNOSIS — F32A Depression, unspecified: Secondary | ICD-10-CM

## 2015-06-05 DIAGNOSIS — G8220 Paraplegia, unspecified: Secondary | ICD-10-CM

## 2015-06-05 MED ORDER — BACLOFEN 10 MG PO TABS: 10.0000 mg | ORAL_TABLET | Freq: Three times a day (TID) | ORAL | Status: DC

## 2015-06-05 NOTE — Telephone Encounter (Signed)
Pt called after leaving office and states that she was unsure if MD was going to call in Rx baclofen (LIORESAL) 10 MG tablet or if she was going to be written one. Please call and advise [phone]
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
## 2015-06-05 NOTE — Telephone Encounter (Signed)
She is aware that the rx has been sent to the pharmacy for her.

## 2015-06-06 ENCOUNTER — Telehealth: Payer: Self-pay

## 2015-06-06 NOTE — Telephone Encounter (Signed)
I left a message for the patient to return my call.

## 2015-06-06 NOTE — Telephone Encounter (Signed)
I spoke to the patient in re the [employer] trial. I explained that she no longer qualifies for the research study since she was already evaluated during our [facility] training. As per Dr. Krista Blue, I let the patient know that she can still try to take baclofen (LIORESAL) 10 MG tablet along with tiZANidine (ZANAFLEX) 4 MG tablet to see if that improves her spasticity.

## 2015-08-04 ENCOUNTER — Other Ambulatory Visit: Payer: Self-pay | Admitting: Neurology

## 2015-09-12 ENCOUNTER — Telehealth: Payer: Self-pay

## 2015-09-12 NOTE — Telephone Encounter (Signed)
The patient returned my call. I spoke to her about the Aspire Trial, and I performed the Early Discontinuation visit. I thanked the patient for her time and participation in the study.

## 2015-09-12 NOTE — Telephone Encounter (Signed)
I left a message for the patient to return my call.

## 2015-10-23 ENCOUNTER — Other Ambulatory Visit: Payer: Self-pay | Admitting: Neurology

## 2015-11-22 ENCOUNTER — Other Ambulatory Visit: Payer: Self-pay | Admitting: Neurology

## 2015-12-05 ENCOUNTER — Ambulatory Visit (INDEPENDENT_AMBULATORY_CARE_PROVIDER_SITE_OTHER): Payer: Medicare Other | Admitting: Neurology

## 2015-12-05 ENCOUNTER — Encounter: Payer: Self-pay | Admitting: Neurology

## 2015-12-05 VITALS — BP 100/64 | HR 78 | Ht 62.0 in | Wt 140.5 lb

## 2015-12-05 DIAGNOSIS — G35 Multiple sclerosis: Secondary | ICD-10-CM

## 2015-12-05 DIAGNOSIS — G8220 Paraplegia, unspecified: Secondary | ICD-10-CM | POA: Diagnosis not present
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
## 2016-01-23 ENCOUNTER — Telehealth: Payer: Self-pay | Admitting: *Deleted

## 2016-01-23 NOTE — Telephone Encounter (Signed)
Patient Mary Villanueva form on [employer].

## 2016-01-23 NOTE — Telephone Encounter (Signed)
I called  the patient to let her know that the  [facility] form was faxed on 12/2815.

## 2016-01-24 DIAGNOSIS — Z0289 Encounter for other administrative examinations: Secondary | ICD-10-CM

## 2016-10-19 ENCOUNTER — Other Ambulatory Visit: Payer: Self-pay | Admitting: Neurology

## 2016-10-20 ENCOUNTER — Other Ambulatory Visit: Payer: Self-pay | Admitting: Neurology

## 2016-12-05 ENCOUNTER — Ambulatory Visit (INDEPENDENT_AMBULATORY_CARE_PROVIDER_SITE_OTHER): Payer: Medicare Other | Admitting: Neurology

## 2016-12-05 ENCOUNTER — Encounter: Payer: Self-pay | Admitting: Neurology

## 2016-12-05 VITALS — BP 112/64 | HR 77 | Ht 62.0 in | Wt 136.5 lb

## 2016-12-05 DIAGNOSIS — G8220 Paraplegia, unspecified: Secondary | ICD-10-CM | POA: Diagnosis not present

## 2016-12-05 DIAGNOSIS — G35 Multiple sclerosis: Secondary | ICD-10-CM

## 2016-12-05 MED ORDER — BACLOFEN 10 MG PO TABS: 10.0000 mg | ORAL_TABLET | Freq: Every day | ORAL | 4 refills | Status: DC

## 2016-12-05 MED ORDER — TIZANIDINE HCL 4 MG PO TABS: 4.0000 mg | ORAL_TABLET | Freq: Three times a day (TID) | ORAL | 4 refills | Status: DC
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
## 2017-03-17 ENCOUNTER — Other Ambulatory Visit: Payer: Self-pay | Admitting: Internal Medicine

## 2017-03-17 ENCOUNTER — Ambulatory Visit
Admission: RE | Admit: 2017-03-17 | Discharge: 2017-03-17 | Disposition: A | Discharge status: Home / Self Care | Payer: Medicare Other | Source: Ambulatory Visit | Attending: Internal Medicine | Admitting: Internal Medicine

## 2017-03-17 DIAGNOSIS — R109 Unspecified abdominal pain: Secondary | ICD-10-CM

## 2017-04-10 ENCOUNTER — Other Ambulatory Visit: Payer: Self-pay | Admitting: *Deleted

## 2017-04-10 MED ORDER — FLUOXETINE HCL 20 MG PO CAPS: 20.0000 mg | ORAL_CAPSULE | Freq: Every day | ORAL | 3 refills | Status: DC

## 2017-04-10 MED ORDER — FLUOXETINE HCL 40 MG PO CAPS: ORAL_CAPSULE | ORAL | 3 refills | Status: DC

## 2017-04-30 ENCOUNTER — Telehealth: Payer: Self-pay | Admitting: *Deleted

## 2017-04-30 DIAGNOSIS — Z0289 Encounter for other administrative examinations: Secondary | ICD-10-CM

## 2017-04-30 NOTE — Telephone Encounter (Signed)
Pt [place] form mailed on 04/30/17 to [facility].

## 2017-12-09 ENCOUNTER — Other Ambulatory Visit: Payer: Self-pay | Admitting: Internal Medicine

## 2017-12-09 DIAGNOSIS — Z1231 Encounter for screening mammogram for malignant neoplasm of breast: Secondary | ICD-10-CM

## 2018-01-24 ENCOUNTER — Other Ambulatory Visit: Payer: Self-pay | Admitting: Neurology

## 2018-02-28 ENCOUNTER — Other Ambulatory Visit: Payer: Self-pay | Admitting: Neurology

## 2018-03-03 ENCOUNTER — Telehealth: Payer: Self-pay | Admitting: Neurology

## 2018-03-03 MED ORDER — BACLOFEN 10 MG PO TABS: 10.0000 mg | ORAL_TABLET | Freq: Every day | ORAL | 0 refills | Status: DC

## 2018-03-03 NOTE — Telephone Encounter (Signed)
Pt has pending appt 06/02/18.  90-day prescription sent to [facility].

## 2018-03-03 NOTE — Addendum Note (Signed)
Addended by: Noberto Retort C on: 03/03/2018 11:42 AM   Modules accepted: Orders

## 2018-03-03 NOTE — Telephone Encounter (Signed)
Pt calling for a refill of baclofen (LIORESAL) 10 MG tablet thru  [place], [place] [phone] (Phone) [phone] (Fax)

## 2018-04-30 ENCOUNTER — Other Ambulatory Visit: Payer: Self-pay | Admitting: Neurology

## 2018-05-08 ENCOUNTER — Other Ambulatory Visit: Payer: Self-pay | Admitting: Neurology

## 2018-05-25 DIAGNOSIS — R109 Unspecified abdominal pain: Secondary | ICD-10-CM

## 2018-05-25 HISTORY — DX: Unspecified abdominal pain: R10.9

## 2018-05-30 ENCOUNTER — Other Ambulatory Visit: Payer: Self-pay | Admitting: Neurology

## 2018-06-02 ENCOUNTER — Encounter

## 2018-06-02 ENCOUNTER — Ambulatory Visit: Payer: Medicare Other | Admitting: Neurology

## 2018-06-02 ENCOUNTER — Encounter: Payer: Self-pay | Admitting: Neurology

## 2018-06-02 ENCOUNTER — Telehealth: Payer: Self-pay | Admitting: Neurology

## 2018-06-02 ENCOUNTER — Other Ambulatory Visit: Payer: Self-pay

## 2018-06-02 VITALS — BP 139/73 | HR 76 | Resp 16 | Ht 62.0 in | Wt 142.0 lb

## 2018-06-02 DIAGNOSIS — G35 Multiple sclerosis: Secondary | ICD-10-CM

## 2018-06-02 DIAGNOSIS — G8220 Paraplegia, unspecified: Secondary | ICD-10-CM

## 2018-06-02 NOTE — Telephone Encounter (Signed)
[facility] Medicare order sent to [facility]. No auth and the will reach out to the pt to schedule.
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
## 2018-06-03 LAB — CBC WITH DIFFERENTIAL
BASOS: 1 %
Basophils Absolute: 0 10*3/uL (ref 0.0–0.2)
EOS (ABSOLUTE): 0.1 10*3/uL (ref 0.0–0.4)
EOS: 2 %
Hematocrit: 36.5 % (ref 34.0–46.6)
Hemoglobin: 11.7 g/dL (ref 11.1–15.9)
IMMATURE GRANULOCYTES: 0 %
Immature Grans (Abs): 0 10*3/uL (ref 0.0–0.1)
LYMPHS ABS: 1.2 10*3/uL (ref 0.7–3.1)
Lymphs: 31 %
MCH: 30.5 pg (ref 26.6–33.0)
MCHC: 32.1 g/dL (ref 31.5–35.7)
MCV: 95 fL (ref 79–97)
MONOS ABS: 0.3 10*3/uL (ref 0.1–0.9)
Monocytes: 8 %
Neutrophils Absolute: 2.2 10*3/uL (ref 1.4–7.0)
Neutrophils: 58 %
RBC: 3.84 x10E6/uL (ref 3.77–5.28)
RDW: 13.8 % (ref 12.3–15.4)
WBC: 3.7 10*3/uL (ref 3.4–10.8)

## 2018-06-03 LAB — COMPREHENSIVE METABOLIC PANEL
A/G RATIO: 2 (ref 1.2–2.2)
ALK PHOS: 78 IU/L (ref 39–117)
ALT: 16 IU/L (ref 0–32)
AST: 23 IU/L (ref 0–40)
Albumin: 4.3 g/dL (ref 3.6–4.8)
BUN/Creatinine Ratio: 18 (ref 12–28)
BUN: 17 mg/dL (ref 8–27)
Bilirubin Total: 0.4 mg/dL (ref 0.0–1.2)
CO2: 26 mmol/L (ref 20–29)
Calcium: 9.5 mg/dL (ref 8.7–10.3)
Chloride: 101 mmol/L (ref 96–106)
Creatinine, Ser: 0.92 mg/dL (ref 0.57–1.00)
GFR calc Af Amer: 77 mL/min/{1.73_m2} (ref >59)
GFR calc non Af Amer: 67 mL/min/{1.73_m2} (ref >59)
GLOBULIN, TOTAL: 2.2 g/dL (ref 1.5–4.5)
Glucose: 84 mg/dL (ref 65–99)
POTASSIUM: 4.2 mmol/L (ref 3.5–5.2)
SODIUM: 143 mmol/L (ref 134–144)
Total Protein: 6.5 g/dL (ref 6.0–8.5)

## 2018-06-03 LAB — TSH: TSH: 1.34 u[IU]/mL (ref 0.450–4.500)

## 2018-06-03 LAB — VITAMIN B12: VITAMIN B 12: 1145 pg/mL (ref 232–1245)

## 2018-06-05 ENCOUNTER — Telehealth: Payer: Self-pay | Admitting: *Deleted

## 2018-06-05 NOTE — Telephone Encounter (Signed)
I faxed pt [place] form to [phone]

## 2018-06-09 DIAGNOSIS — Z0289 Encounter for other administrative examinations: Secondary | ICD-10-CM

## 2018-06-16 ENCOUNTER — Observation Stay (HOSPITAL_COMMUNITY)
Admission: EM | Admit: 2018-06-16 | Discharge: 2018-06-20 | Disposition: A | Discharge status: Home / Self Care | Payer: Medicare Other | Attending: General Surgery | Admitting: General Surgery

## 2018-06-16 ENCOUNTER — Encounter (HOSPITAL_COMMUNITY): Payer: Self-pay

## 2018-06-16 ENCOUNTER — Other Ambulatory Visit: Payer: Self-pay

## 2018-06-16 ENCOUNTER — Emergency Department (HOSPITAL_COMMUNITY): Payer: Medicare Other

## 2018-06-16 DIAGNOSIS — I10 Essential (primary) hypertension: Secondary | ICD-10-CM | POA: Insufficient documentation

## 2018-06-16 DIAGNOSIS — G35 Multiple sclerosis: Secondary | ICD-10-CM | POA: Diagnosis not present

## 2018-06-16 DIAGNOSIS — Z79899 Other long term (current) drug therapy: Secondary | ICD-10-CM | POA: Diagnosis not present

## 2018-06-16 DIAGNOSIS — K6389 Other specified diseases of intestine: Secondary | ICD-10-CM | POA: Insufficient documentation

## 2018-06-16 DIAGNOSIS — Z79890 Hormone replacement therapy (postmenopausal): Secondary | ICD-10-CM | POA: Insufficient documentation

## 2018-06-16 DIAGNOSIS — R140 Abdominal distension (gaseous): Secondary | ICD-10-CM | POA: Insufficient documentation

## 2018-06-16 DIAGNOSIS — R109 Unspecified abdominal pain: Secondary | ICD-10-CM | POA: Diagnosis present

## 2018-06-16 DIAGNOSIS — K5909 Other constipation: Secondary | ICD-10-CM | POA: Diagnosis not present

## 2018-06-16 DIAGNOSIS — Z8249 Family history of ischemic heart disease and other diseases of the circulatory system: Secondary | ICD-10-CM | POA: Insufficient documentation

## 2018-06-16 DIAGNOSIS — D259 Leiomyoma of uterus, unspecified: Secondary | ICD-10-CM | POA: Diagnosis not present

## 2018-06-16 DIAGNOSIS — I700 Atherosclerosis of aorta: Secondary | ICD-10-CM | POA: Diagnosis not present

## 2018-06-16 DIAGNOSIS — Z9889 Other specified postprocedural states: Secondary | ICD-10-CM | POA: Diagnosis not present

## 2018-06-16 DIAGNOSIS — Z87891 Personal history of nicotine dependence: Secondary | ICD-10-CM | POA: Diagnosis not present

## 2018-06-16 DIAGNOSIS — G114 Hereditary spastic paraplegia: Secondary | ICD-10-CM | POA: Diagnosis not present

## 2018-06-16 DIAGNOSIS — K599 Functional intestinal disorder, unspecified: Secondary | ICD-10-CM

## 2018-06-16 DIAGNOSIS — E7800 Pure hypercholesterolemia, unspecified: Secondary | ICD-10-CM | POA: Diagnosis not present

## 2018-06-16 DIAGNOSIS — Z823 Family history of stroke: Secondary | ICD-10-CM | POA: Diagnosis not present

## 2018-06-16 DIAGNOSIS — K5901 Slow transit constipation: Secondary | ICD-10-CM

## 2018-06-16 HISTORY — DX: Unspecified abdominal pain: R10.9

## 2018-06-16 LAB — COMPREHENSIVE METABOLIC PANEL
ALBUMIN: 3.8 g/dL (ref 3.5–5.0)
ALT: 19 U/L (ref 0–44)
AST: 26 U/L (ref 15–41)
Alkaline Phosphatase: 70 U/L (ref 38–126)
Anion gap: 11 (ref 5–15)
BUN: 18 mg/dL (ref 8–23)
CHLORIDE: 102 mmol/L (ref 98–111)
CO2: 27 mmol/L (ref 22–32)
Calcium: 9.3 mg/dL (ref 8.9–10.3)
Creatinine, Ser: 1.02 mg/dL — ABNORMAL HIGH (ref 0.44–1.00)
GFR calc Af Amer: >60 mL/min (ref >60)
GFR calc non Af Amer: 58 mL/min — ABNORMAL LOW (ref >60)
GLUCOSE: 120 mg/dL — AB (ref 70–99)
POTASSIUM: 4 mmol/L (ref 3.5–5.1)
SODIUM: 140 mmol/L (ref 135–145)
Total Bilirubin: 0.6 mg/dL (ref 0.3–1.2)
Total Protein: 6.9 g/dL (ref 6.5–8.1)

## 2018-06-16 LAB — CBC
HEMATOCRIT: 38.8 % (ref 36.0–46.0)
HEMOGLOBIN: 12.7 g/dL (ref 12.0–15.0)
MCH: 31.4 pg (ref 26.0–34.0)
MCHC: 32.7 g/dL (ref 30.0–36.0)
MCV: 95.8 fL (ref 78.0–100.0)
Platelets: 207 10*3/uL (ref 150–400)
RBC: 4.05 MIL/uL (ref 3.87–5.11)
RDW: 13 % (ref 11.5–15.5)
WBC: 7.4 10*3/uL (ref 4.0–10.5)

## 2018-06-16 LAB — URINALYSIS, ROUTINE W REFLEX MICROSCOPIC
Bilirubin Urine: NEGATIVE
GLUCOSE, UA: NEGATIVE mg/dL
Hgb urine dipstick: NEGATIVE
KETONES UR: 5 mg/dL — AB
LEUKOCYTES UA: NEGATIVE
NITRITE: NEGATIVE
PH: 7 (ref 5.0–8.0)
Protein, ur: NEGATIVE mg/dL
SPECIFIC GRAVITY, URINE: 1.028 (ref 1.005–1.030)

## 2018-06-16 LAB — LIPASE, BLOOD: LIPASE: 40 U/L (ref 11–51)

## 2018-06-16 MED ORDER — FAMOTIDINE IN NACL 20-0.9 MG/50ML-% IV SOLN
20.0000 mg | Freq: Once | INTRAVENOUS | Status: AC
Start: 2018-06-16 — End: 2018-06-16
  Administered 2018-06-16: 20 mg via INTRAVENOUS
  Filled 2018-06-16: qty 50

## 2018-06-16 MED ORDER — FENTANYL CITRATE (PF) 100 MCG/2ML IJ SOLN: 50.0000 ug | Freq: Once | INTRAMUSCULAR | Status: DC

## 2018-06-16 MED ORDER — HYDRALAZINE HCL 20 MG/ML IJ SOLN: 10.0000 mg | INTRAMUSCULAR | Status: DC | PRN

## 2018-06-16 MED ORDER — KCL IN DEXTROSE-NACL 20-5-0.45 MEQ/L-%-% IV SOLN
INTRAVENOUS | Status: DC
  Administered 2018-06-16 – 2018-06-18 (×5): via INTRAVENOUS
  Filled 2018-06-16: qty 2000
  Filled 2018-06-16 (×3): qty 1000

## 2018-06-16 MED ORDER — BACLOFEN 10 MG PO TABS
10.0000 mg | ORAL_TABLET | Freq: Every day | ORAL | Status: DC
  Administered 2018-06-16 – 2018-06-19 (×4): 10 mg via ORAL
  Filled 2018-06-16 (×4): qty 1

## 2018-06-16 MED ORDER — SIMETHICONE 80 MG PO CHEW: 40.0000 mg | CHEWABLE_TABLET | Freq: Four times a day (QID) | ORAL | Status: DC | PRN

## 2018-06-16 MED ORDER — DIPHENHYDRAMINE HCL 12.5 MG/5ML PO ELIX: 12.5000 mg | ORAL_SOLUTION | Freq: Four times a day (QID) | ORAL | Status: DC | PRN

## 2018-06-16 MED ORDER — METOPROLOL TARTRATE 5 MG/5ML IV SOLN: 5.0000 mg | Freq: Four times a day (QID) | INTRAVENOUS | Status: DC | PRN

## 2018-06-16 MED ORDER — TIZANIDINE HCL 2 MG PO TABS
4.0000 mg | ORAL_TABLET | Freq: Every evening | ORAL | Status: DC | PRN
  Administered 2018-06-17: 4 mg via ORAL
  Filled 2018-06-16: qty 2

## 2018-06-16 MED ORDER — ENOXAPARIN SODIUM 40 MG/0.4ML ~~LOC~~ SOLN
40.0000 mg | SUBCUTANEOUS | Status: DC
  Administered 2018-06-16 – 2018-06-19 (×3): 40 mg via SUBCUTANEOUS
  Filled 2018-06-16 (×4): qty 0.4

## 2018-06-16 MED ORDER — ONDANSETRON HCL 4 MG/2ML IJ SOLN
4.0000 mg | Freq: Once | INTRAMUSCULAR | Status: AC
  Administered 2018-06-16: 4 mg via INTRAVENOUS
  Filled 2018-06-16: qty 2

## 2018-06-16 MED ORDER — IRBESARTAN 300 MG PO TABS
300.0000 mg | ORAL_TABLET | Freq: Every day | ORAL | Status: DC
  Administered 2018-06-16 – 2018-06-20 (×5): 300 mg via ORAL
  Filled 2018-06-16 (×5): qty 1

## 2018-06-16 MED ORDER — BUPROPION HCL ER (SR) 150 MG PO TB12
150.0000 mg | ORAL_TABLET | Freq: Every day | ORAL | Status: DC
  Administered 2018-06-16 – 2018-06-20 (×5): 150 mg via ORAL
  Filled 2018-06-16 (×5): qty 1

## 2018-06-16 MED ORDER — ACETAMINOPHEN 650 MG RE SUPP: 650.0000 mg | Freq: Four times a day (QID) | RECTAL | Status: DC | PRN

## 2018-06-16 MED ORDER — FLUOXETINE HCL 20 MG PO CAPS
60.0000 mg | ORAL_CAPSULE | Freq: Every day | ORAL | Status: DC
  Administered 2018-06-16 – 2018-06-20 (×5): 60 mg via ORAL
  Filled 2018-06-16 (×5): qty 3

## 2018-06-16 MED ORDER — IOHEXOL 300 MG/ML  SOLN
100.0000 mL | Freq: Once | INTRAMUSCULAR | Status: AC | PRN
  Administered 2018-06-16: 100 mL via INTRAVENOUS

## 2018-06-16 MED ORDER — ACETAMINOPHEN 325 MG PO TABS
650.0000 mg | ORAL_TABLET | Freq: Four times a day (QID) | ORAL | Status: DC | PRN
  Administered 2018-06-18: 650 mg via ORAL
  Filled 2018-06-16: qty 2

## 2018-06-16 MED ORDER — HYDROMORPHONE HCL 1 MG/ML IJ SOLN
1.0000 mg | Freq: Once | INTRAMUSCULAR | Status: AC
  Administered 2018-06-16: 1 mg via INTRAVENOUS
  Filled 2018-06-16: qty 1

## 2018-06-16 MED ORDER — OXYBUTYNIN CHLORIDE ER 10 MG PO TB24
10.0000 mg | ORAL_TABLET | Freq: Every day | ORAL | Status: DC
  Administered 2018-06-16 – 2018-06-19 (×4): 10 mg via ORAL
  Filled 2018-06-16 (×4): qty 1

## 2018-06-16 MED ORDER — HYDROMORPHONE HCL 1 MG/ML IJ SOLN: 0.5000 mg | INTRAMUSCULAR | Status: DC | PRN

## 2018-06-16 MED ORDER — ONDANSETRON HCL 4 MG/2ML IJ SOLN: 4.0000 mg | Freq: Four times a day (QID) | INTRAMUSCULAR | Status: DC | PRN

## 2018-06-16 MED ORDER — ONDANSETRON 4 MG PO TBDP: 4.0000 mg | ORAL_TABLET | Freq: Four times a day (QID) | ORAL | Status: DC | PRN

## 2018-06-16 MED ORDER — LEVOTHYROXINE SODIUM 25 MCG PO TABS
25.0000 ug | ORAL_TABLET | Freq: Every day | ORAL | Status: DC
  Administered 2018-06-17 – 2018-06-20 (×4): 25 ug via ORAL
  Filled 2018-06-16 (×4): qty 1

## 2018-06-16 MED ORDER — DIPHENHYDRAMINE HCL 50 MG/ML IJ SOLN: 12.5000 mg | Freq: Four times a day (QID) | INTRAMUSCULAR | Status: DC | PRN

## 2018-06-16 NOTE — ED Provider Notes (Addendum)
[facility] EMERGENCY DEPARTMENT Provider Note   CSN: [id number] Arrival date & time: 06/16/18  3810     History   Chief Complaint Chief Complaint  Patient presents with  . Abdominal Pain  . Back Pain    HPI Mary Villanueva is a 62 y.o. female.  Patient c/o lower abdominal pain in past day. Symptoms acute onset, persistent, moderate-severe, worse w palpation, non radiating. Denies hx same symptoms. Nausea. No vomiting or diarrhea. No constipation. Denies fever or chills. +decreased appetite. No dysuria or hematuria.   The history is provided by the patient.  Abdominal Pain   Associated symptoms include nausea. Pertinent negatives include fever, dysuria and headaches.  Back Pain   Associated symptoms include abdominal pain. Pertinent negatives include no chest pain, no fever, no headaches and no dysuria.    Past Medical History:  Diagnosis Date  . Depression   . High cholesterol   . Hypercholesteremia   . Hypertension   . MS (multiple sclerosis) [facility])     Patient Active Problem List   Diagnosis Date Noted  . 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 Hypertension   . Depression   . High cholesterol   . MS (multiple sclerosis) ([place])   . Hypercholesteremia   . Multiple sclerosis ([place]) 03/22/2013    Past Surgical History:  Procedure Laterality Date  . BREAST REDUCTION SURGERY  1998     OB History   None      Home Medications    Prior to Admission medications   Medication Sig Start Date End Date Taking? Authorizing Provider  baclofen (LIORESAL) 10 MG tablet TAKE 1 TABLET BY MOUTH AT  BEDTIME 04/30/18   Marcial Pacas, MD  Biotin 10 MG CAPS Take by mouth.    [provider]  buPROPion [facility] SR) 150 MG 12 hr tablet  11/10/14   [provider]  calcium carbonate (OS-CAL) 600 MG TABS Take 600 mg by mouth 2 (two) times daily with a meal.    [provider]  Cholecalciferol (VITAMIN  D-3) 1000 UNITS CAPS Take 1,000 Units by mouth 2 (two) times daily.    [provider]  FLUoxetine (PROZAC) 20 MG capsule TAKE 1 CAPSULE BY MOUTH  DAILY. TAKES ALONG WITH A  40MG  DAILY. 01/26/18   Marcial Pacas, MD  FLUoxetine (PROZAC) 40 MG capsule TAKE ONE CAPSULE BY MOUTH  ONCE DAILY WITH 20 MG  CAPSULE 01/26/18   Marcial Pacas, MD  levothyroxine (SYNTHROID) 25 MCG tablet Take 25 mcg by mouth daily before breakfast.    [provider]  Multiple Vitamin (MULTIVITAMIN) capsule Take 1 capsule by mouth daily.    [provider]  Omega-3 Fatty Acids (FISH OIL) 1200 MG CAPS Take 1,200 mg by mouth daily.    [provider]  oxybutynin (DITROPAN-XL) 10 MG 24 hr tablet  07/14/14   [provider]  Probiotic Product (PROBIOTIC DAILY PO) Take 1 capsule by mouth daily.    [provider]  simvastatin (ZOCOR) 20 MG tablet Take 20 mg by mouth every evening.    [provider]  tiZANidine (ZANAFLEX) 4 MG tablet TAKE 1 TABLET BY MOUTH 3  TIMES DAILY 04/30/18   Marcial Pacas, MD  valsartan (DIOVAN) 320 MG tablet Take 320 mg by mouth daily.    [provider]  vitamin B-12 (CYANOCOBALAMIN) 500 MCG tablet Take 500 mcg by mouth daily.    [provider]    Family History  Family History  Problem Relation Age of Onset  . Stroke Mother   . Heart disease Father   . Diabetes Sister   . Diabetes Brother     Social History Social History   Tobacco Use  . Smoking status: Former Smoker    Types: Cigarettes  . Smokeless tobacco: Never Used  . Tobacco comment: Quit 36 Years ago.  Substance Use Topics  . Alcohol use: Yes    Alcohol/week: 0.6 oz    Types: 1 Glasses of wine per week    Comment: ralely  . Drug use: No     Allergies   Patient has no known allergies.   Review of Systems Review of Systems  Constitutional: Negative for fever.  HENT: Negative for sore throat.   Eyes: Negative for redness.  Respiratory: Negative for  shortness of breath.   Cardiovascular: Negative for chest pain.  Gastrointestinal: Positive for abdominal pain and nausea.  Endocrine: Negative for polyuria.  Genitourinary: Negative for dysuria and flank pain.  Musculoskeletal: Positive for back pain. Negative for neck pain.  Skin: Negative for rash.  Neurological: Negative for headaches.  Hematological: Does not bruise/bleed easily.  Psychiatric/Behavioral: Negative for confusion.     Physical Exam Updated Vital Signs BP (!) 143/62 (BP Location: Right Arm)   Pulse 68   Temp 99.1 F (37.3 C) (Oral)   Resp 17   Ht 1.549 m (5\' 1" )   Wt 64.9 kg (143 lb)   SpO2 100%   BMI 27.02 kg/m   Physical Exam  Constitutional: She appears well-developed and well-nourished.  HENT:  Mouth/Throat: Oropharynx is clear and moist.  Eyes: Conjunctivae are normal. No scleral icterus.  Neck: Neck supple. No tracheal deviation present.  Cardiovascular: Normal rate, regular rhythm, normal heart sounds and intact distal pulses. Exam reveals no gallop and no friction rub.  No murmur heard. Pulmonary/Chest: Effort normal and breath sounds normal. No respiratory distress.  Abdominal: Soft. Normal appearance and bowel sounds are normal. She exhibits no distension and no mass. There is tenderness. There is no rebound and no guarding. No hernia.  Marked lower abd tenderness, esp right.   Genitourinary:  Genitourinary Comments: No cva tenderness  Musculoskeletal: She exhibits no edema.  Neurological: She is alert.  Skin: Skin is warm and dry. No rash noted.  Psychiatric: She has a normal mood and affect.  Nursing note and vitals reviewed.    ED Treatments / Results  Labs (all labs ordered are listed, but only abnormal results are displayed) Results for orders placed or performed during the hospital encounter of 06/16/18  Lipase, blood  Result Value Ref Range   Lipase 40 11 - 51 U/L  Comprehensive metabolic panel  Result Value Ref Range   Sodium  140 135 - 145 mmol/L   Potassium 4.0 3.5 - 5.1 mmol/L   Chloride 102 98 - 111 mmol/L   CO2 27 22 - 32 mmol/L   Glucose, Bld 120 (H) 70 - 99 mg/dL   BUN 18 8 - 23 mg/dL   Creatinine, Ser 1.02 (H) 0.44 - 1.00 mg/dL   Calcium 9.3 8.9 - 10.3 mg/dL   Total Protein 6.9 6.5 - 8.1 g/dL   Albumin 3.8 3.5 - 5.0 g/dL   AST 26 15 - 41 U/L   ALT 19 0 - 44 U/L   Alkaline Phosphatase 70 38 - 126 U/L   Total Bilirubin 0.6 0.3 - 1.2 mg/dL   GFR calc non Af Amer 58 (L) >60 mL/min  GFR calc Af Amer >60 >60 mL/min   Anion gap 11 5 - 15  CBC  Result Value Ref Range   WBC 7.4 4.0 - 10.5 K/uL   RBC 4.05 3.87 - 5.11 MIL/uL   Hemoglobin 12.7 12.0 - 15.0 g/dL   HCT 38.8 36.0 - 46.0 %   MCV 95.8 78.0 - 100.0 fL   MCH 31.4 26.0 - 34.0 pg   MCHC 32.7 30.0 - 36.0 g/dL   RDW 13.0 11.5 - 15.5 %   Platelets 207 150 - 400 K/uL  Urinalysis, Routine w reflex microscopic  Result Value Ref Range   Color, Urine YELLOW YELLOW   APPearance HAZY (A) CLEAR   Specific Gravity, Urine 1.028 1.005 - 1.030   pH 7.0 5.0 - 8.0   Glucose, UA NEGATIVE NEGATIVE mg/dL   Hgb urine dipstick NEGATIVE NEGATIVE   Bilirubin Urine NEGATIVE NEGATIVE   Ketones, ur 5 (A) NEGATIVE mg/dL   Protein, ur NEGATIVE NEGATIVE mg/dL   Nitrite NEGATIVE NEGATIVE   Leukocytes, UA NEGATIVE NEGATIVE   Ct Abdomen Pelvis W Contrast  Result Date: 06/16/2018 CLINICAL DATA:  Lower abdominal pain, constipation EXAM: CT ABDOMEN AND PELVIS WITH CONTRAST TECHNIQUE: Multidetector CT imaging of the abdomen and pelvis was performed using the standard protocol following bolus administration of intravenous contrast. CONTRAST:  152mL OMNIPAQUE IOHEXOL 300 MG/ML  SOLN COMPARISON:  03/17/2017 FINDINGS: Lower chest: Lung bases are clear. No effusions. Heart is normal size. Hepatobiliary: No focal hepatic abnormality. Gallbladder unremarkable. Pancreas: No focal abnormality or ductal dilatation. Spleen: No focal abnormality.  Normal size. Adrenals/Urinary Tract: No  adrenal abnormality. No focal renal abnormality. No stones or hydronephrosis. Urinary bladder is unremarkable. Stomach/Bowel: Moderate stool burden throughout the colon. Gaseous distention of the right colon. The cecum is noted in the upper abdomen near the midline just below the liver with large amount of gas and stool noted in the cecum and proximal ascending colon. Appendix is partially gas-filled. No evidence of appendicitis. Stomach and small bowel decompressed, grossly unremarkable. Reproductive: Uterine fibroids suspected.  No adnexal mass. Other: No free fluid or free air. Musculoskeletal: No acute bony abnormality. IMPRESSION: Significant focal gaseous distention of the cecum and proximal ascending colon. The cecum is located in the upper abdomen near the midline just inferior to the bladder. The distal ascending colon and remainder of the colon is decompressed with moderate stool burden throughout the colon. Cannot completely exclude cecal volvulus. Appendix normal. Fibroid uterus. Aortic atherosclerosis. Electronically Signed   By: Rolm Baptise M.D.   On: 06/16/2018 10:44    EKG None  Radiology Ct Abdomen Pelvis W Contrast  Result Date: 06/16/2018 CLINICAL DATA:  Lower abdominal pain, constipation EXAM: CT ABDOMEN AND PELVIS WITH CONTRAST TECHNIQUE: Multidetector CT imaging of the abdomen and pelvis was performed using the standard protocol following bolus administration of intravenous contrast. CONTRAST:  131mL OMNIPAQUE IOHEXOL 300 MG/ML  SOLN COMPARISON:  03/17/2017 FINDINGS: Lower chest: Lung bases are clear. No effusions. Heart is normal size. Hepatobiliary: No focal hepatic abnormality. Gallbladder unremarkable. Pancreas: No focal abnormality or ductal dilatation. Spleen: No focal abnormality.  Normal size. Adrenals/Urinary Tract: No adrenal abnormality. No focal renal abnormality. No stones or hydronephrosis. Urinary bladder is unremarkable. Stomach/Bowel: Moderate stool burden throughout  the colon. Gaseous distention of the right colon. The cecum is noted in the upper abdomen near the midline just below the liver with large amount of gas and stool noted in the cecum and proximal ascending colon. Appendix is partially gas-filled.  No evidence of appendicitis. Stomach and small bowel decompressed, grossly unremarkable. Reproductive: Uterine fibroids suspected.  No adnexal mass. Other: No free fluid or free air. Musculoskeletal: No acute bony abnormality. IMPRESSION: Significant focal gaseous distention of the cecum and proximal ascending colon. The cecum is located in the upper abdomen near the midline just inferior to the bladder. The distal ascending colon and remainder of the colon is decompressed with moderate stool burden throughout the colon. Cannot completely exclude cecal volvulus. Appendix normal. Fibroid uterus. Aortic atherosclerosis. Electronically Signed   By: Rolm Baptise M.D.   On: 06/16/2018 10:44    Procedures Procedures (including critical care time)  Medications Ordered in ED Medications  HYDROmorphone (DILAUDID) injection 1 mg (has no administration in time range)  ondansetron (ZOFRAN) injection 4 mg (has no administration in time range)     Initial Impression / Assessment and Plan / ED Course  I have reviewed the triage vital signs and the nursing notes.  Pertinent labs & imaging results that were available during my care of the patient were reviewed by me and considered in my medical decision making (see chart for details).  Iv ns. Dilaudid 1 mg iv for pain. zofran for nausea. Labs. Imaging.  Reviewed nursing notes and prior charts for additional history.   Pain and tenderness persist, will get ct.   Ct reviewed - ?gas pocket/distension vs cecal volvulus. General surgery consulted to evaluate, review CT.  On recheck, pain has resolved and abd soft nt.     Final Clinical Impressions(s) / ED Diagnoses   Final diagnoses:  None    ED Discharge  Orders    None       Lajean Saver, MD 06/16/18 [id number]    Lajean Saver, MD 06/17/18 [phone]

## 2018-06-16 NOTE — Progress Notes (Signed)
Transferred from ED alert,oriented x4

## 2018-06-16 NOTE — H&P (Signed)
Mary Villanueva Mar 18, 1956  [id number].    Chief Complaint/Reason for Consult: cecal distention, ? Cecal volvulus   HPI:  This is a very pleasant 62 yo black female with a history of MS since 1986, HTN, and HLD, who has had constipation ever since she was diagnosed with MS in 1986.  She states she doesn't take anything for this except she eats yogurt with a probiotic and granola each morning.  She does move her bowels, but it does appear on old CT scans that she clearly still has constipation and does not evacuate her colon.    Last night she was voiding and had an acute onset of lower abdominal pain.  She denies N/V or a fever.  Her was crampy and progressed throughout the night.  She states that when she presented to the ED it was 10/10.  She has never had pain like this before and does not have pain secondary to her constipation.  Upon arrival to the ED, she has been found to have normal labs, but a CT that shows significant dilatation of her cecum and volvulus can't be excluded, but not that this is what's causing the dilatation either.  We have been asked to see the patient for further recommendations.  ROS: ROS : Please see HPI, otherwise negative.  She does mobilize with a walker, drives, and lives by herself.  Family History  Problem Relation Age of Onset  . Stroke Mother   . Heart disease Father   . Diabetes Sister   . Diabetes Brother     Past Medical History:  Diagnosis Date  . Depression   . High cholesterol   . Hypercholesteremia   . Hypertension   . MS (multiple sclerosis) ([place])     Past Surgical History:  Procedure Laterality Date  . BREAST REDUCTION SURGERY  1998    Social History:  reports that she has quit smoking. Her smoking use included cigarettes. She has never used smokeless tobacco. She reports that she drinks about 0.6 oz of alcohol per week. She reports that she does not use drugs.  Allergies: No Known Allergies   (Not in a hospital  admission)   Physical Exam: Blood pressure 132/81, pulse 87, temperature 99.1 F (37.3 C), temperature source Oral, resp. rate 18, height 5' 1" (1.549 m), weight 64.9 kg (143 lb), SpO2 96 %. General: pleasant, WD, WN black female who is laying in bed in NAD HEENT: head is normocephalic, atraumatic.  Sclera are noninjected.  PERRL.  Ears and nose without any masses or lesions.  Mouth is pink and moist Heart: regular, rate, and rhythm.  Normal s1,s2. No obvious murmurs, gallops, or rubs noted.  Palpable radial and pedal pulses bilaterally Lungs: CTAB, no wheezes, rhonchi, or rales noted.  Respiratory effort nonlabored Abd: soft, NT, ND, +BS, no masses, hernias, or organomegaly MS: all 4 extremities are symmetrical with no cyanosis, clubbing, or edema. Skin: warm and dry with no masses, lesions, or rashes Psych: A&Ox3 with an appropriate affect.   Results for orders placed or performed during the hospital encounter of 06/16/18 (from the past 48 hour(s))  Lipase, blood     Status: None   Collection Time: 06/16/18  7:50 AM  Result Value Ref Range   Lipase 40 11 - 51 U/L    Comment: Performed at [facility], [address], [place],  [address]  Comprehensive metabolic panel     Status: Abnormal   Collection Time:  06/16/18  7:50 AM  Result Value Ref Range   Sodium 140 135 - 145 mmol/L   Potassium 4.0 3.5 - 5.1 mmol/L   Chloride 102 98 - 111 mmol/L   CO2 27 22 - 32 mmol/L   Glucose, Bld 120 (H) 70 - 99 mg/dL   BUN 18 8 - 23 mg/dL   Creatinine, Ser 1.02 (H) 0.44 - 1.00 mg/dL   Calcium 9.3 8.9 - 10.3 mg/dL   Total Protein 6.9 6.5 - 8.1 g/dL   Albumin 3.8 3.5 - 5.0 g/dL   AST 26 15 - 41 U/L   ALT 19 0 - 44 U/L   Alkaline Phosphatase 70 38 - 126 U/L   Total Bilirubin 0.6 0.3 - 1.2 mg/dL   GFR calc non Af Amer 58 (L) >60 mL/min   GFR calc Af Amer >60 >60 mL/min    Comment: (NOTE) The eGFR has been calculated using the CKD EPI equation. This calculation has not been  validated in all clinical situations. eGFR's persistently <60 mL/min signify possible Chronic Kidney Disease.    Anion gap 11 5 - 15    Comment: Performed at [facility] [address], [place], [place] [address]  CBC     Status: None   Collection Time: 06/16/18  7:50 AM  Result Value Ref Range   WBC 7.4 4.0 - 10.5 K/uL   RBC 4.05 3.87 - 5.11 MIL/uL   Hemoglobin 12.7 12.0 - 15.0 g/dL   HCT 38.8 36.0 - 46.0 %   MCV 95.8 78.0 - 100.0 fL   MCH 31.4 26.0 - 34.0 pg   MCHC 32.7 30.0 - 36.0 g/dL   RDW 13.0 11.5 - 15.5 %   Platelets 207 150 - 400 K/uL    Comment: Performed at [facility], [place] [address] [place], [place] [address]  Urinalysis, Routine w reflex microscopic     Status: Abnormal   Collection Time: 06/16/18 10:13 AM  Result Value Ref Range   Color, Urine YELLOW YELLOW   APPearance HAZY (A) CLEAR   Specific Gravity, Urine 1.028 1.005 - 1.030   pH 7.0 5.0 - 8.0   Glucose, UA NEGATIVE NEGATIVE mg/dL   Hgb urine dipstick NEGATIVE NEGATIVE   Bilirubin Urine NEGATIVE NEGATIVE   Ketones, ur 5 (A) NEGATIVE mg/dL   Protein, ur NEGATIVE NEGATIVE mg/dL   Nitrite NEGATIVE NEGATIVE   Leukocytes, UA NEGATIVE NEGATIVE    Comment: Performed at [facility] [address] [place], [place] [address]   Ct Abdomen Pelvis W Contrast  Result Date: 06/16/2018 CLINICAL DATA:  Lower abdominal pain, constipation EXAM: CT ABDOMEN AND PELVIS WITH CONTRAST TECHNIQUE: Multidetector CT imaging of the abdomen and pelvis was performed using the standard protocol following bolus administration of intravenous contrast. CONTRAST:  159m OMNIPAQUE IOHEXOL 300 MG/ML  SOLN COMPARISON:  03/17/2017 FINDINGS: Lower chest: Lung bases are clear. No effusions. Heart is normal size. Hepatobiliary: No focal hepatic abnormality. Gallbladder unremarkable. Pancreas: No focal abnormality or ductal dilatation. Spleen: No focal abnormality.  Normal size. Adrenals/Urinary Tract: No adrenal abnormality. No  focal renal abnormality. No stones or hydronephrosis. Urinary bladder is unremarkable. Stomach/Bowel: Moderate stool burden throughout the colon. Gaseous distention of the right colon. The cecum is noted in the upper abdomen near the midline just below the liver with large amount of gas and stool noted in the cecum and proximal ascending colon. Appendix is partially gas-filled. No evidence of appendicitis. Stomach and small bowel decompressed,  grossly unremarkable. Reproductive: Uterine fibroids suspected.  No adnexal mass. Other: No free fluid or free air. Musculoskeletal: No acute bony abnormality. IMPRESSION: Significant focal gaseous distention of the cecum and proximal ascending colon. The cecum is located in the upper abdomen near the midline just inferior to the bladder. The distal ascending colon and remainder of the colon is decompressed with moderate stool burden throughout the colon. Cannot completely exclude cecal volvulus. Appendix normal. Fibroid uterus. Aortic atherosclerosis. Electronically Signed   By: Rolm Baptise M.D.   On: 06/16/2018 10:44      Assessment/Plan Abdominal pain with cecal dilatation The patient's CT scan is not totally convincing that the patient has a cecal volvulus.  Upon review of her last CT scan, which was secondary to an acute onset of bilateral lower abdominal pain, she appeared to have significant constipation and a redundant right colon, but otherwise unremarkable for any other cause.  She was noted to have uterine fibroids.  She denied to me that she had had acute abdominal pain in the past like today.  Her scan today though does show a difference in that her cecum is significantly dilated.  It is difficult to tell if this is secondary to volvulus or due to possible atony etc.  She last had a colonoscopy in 2012 which was normal according to the patient.  She currently is not having any pain, N/V.  After review of her scan with Dr. Excell Seltzer, we will admit the  patient and plan for conservative observation.  She will be NPO x meds and plan to repeat abdominal films in the morning.  If these film still show significant distention and further evidence of volvulus then she may require surgical intervention.  If this shows improvement, then we will continue with conservative management.  She will need a bowel regimen for her constipation once this acute issues resolves.  This was discussed with the patient and her sister who is present and agreeable to this plan.  MS - cont home meds for muscle relaxers etc Depression - cont home meds as to not abruptly stop these HTN - cont home meds, prn meds for HTN while here otherwise   FEN - npo x meds/IVFs VTE - Lovenox/SCDs ID - none  Henreitta Cea, [facility] 06/16/2018, 1:36 PM Pager: [phone]

## 2018-06-16 NOTE — ED Notes (Signed)
Pt describes pain as aching, in lower abd and lower back, 10/10

## 2018-06-16 NOTE — ED Triage Notes (Signed)
Pt from home via ems; c/o abd and back pain starting late last night/ early this am; no constipation, no n/v/d; not on blood thinners; hx HTN, MS  BP 166/78 HR 74 100% RA 14 RR

## 2018-06-17 ENCOUNTER — Inpatient Hospital Stay (HOSPITAL_COMMUNITY): Payer: Medicare Other

## 2018-06-17 LAB — CBC
HEMATOCRIT: 36.1 % (ref 36.0–46.0)
HEMOGLOBIN: 11.7 g/dL — AB (ref 12.0–15.0)
MCH: 31.3 pg (ref 26.0–34.0)
MCHC: 32.4 g/dL (ref 30.0–36.0)
MCV: 96.5 fL (ref 78.0–100.0)
Platelets: 178 10*3/uL (ref 150–400)
RBC: 3.74 MIL/uL — AB (ref 3.87–5.11)
RDW: 13.2 % (ref 11.5–15.5)
WBC: 5.8 10*3/uL (ref 4.0–10.5)

## 2018-06-17 LAB — BASIC METABOLIC PANEL
Anion gap: 5 (ref 5–15)
BUN: 9 mg/dL (ref 8–23)
CHLORIDE: 106 mmol/L (ref 98–111)
CO2: 30 mmol/L (ref 22–32)
Calcium: 9.1 mg/dL (ref 8.9–10.3)
Creatinine, Ser: 1.05 mg/dL — ABNORMAL HIGH (ref 0.44–1.00)
GFR calc non Af Amer: 56 mL/min — ABNORMAL LOW (ref >60)
Glucose, Bld: 150 mg/dL — ABNORMAL HIGH (ref 70–99)
POTASSIUM: 5.1 mmol/L (ref 3.5–5.1)
SODIUM: 141 mmol/L (ref 135–145)

## 2018-06-17 LAB — HIV ANTIBODY (ROUTINE TESTING W REFLEX): HIV Screen 4th Generation wRfx: NONREACTIVE

## 2018-06-17 MED ORDER — POLYETHYLENE GLYCOL 3350 17 G PO PACK
17.0000 g | PACK | Freq: Three times a day (TID) | ORAL | Status: DC
  Administered 2018-06-17 – 2018-06-20 (×7): 17 g via ORAL
  Filled 2018-06-17 (×9): qty 1

## 2018-06-17 MED ORDER — BISACODYL 10 MG RE SUPP
10.0000 mg | Freq: Every day | RECTAL | Status: DC | PRN
  Administered 2018-06-17 – 2018-06-18 (×2): 10 mg via RECTAL
  Filled 2018-06-17 (×2): qty 1

## 2018-06-17 NOTE — Progress Notes (Signed)
CC:  Subjective: She has had her second dose of Miralax without results so far, no BM for 2-3 days.  Much more comfortable today.    Objective: Vital signs in last 24 hours: Temp:  [98.7 F (37.1 C)-99.4 F (37.4 C)] 98.7 F (37.1 C) (07/24 1050) Pulse Rate:  [66-77] 66 (07/24 1050) Resp:  [18-20] 18 (07/24 1050) BP: (123-148)/(62-75) 141/71 (07/24 1050) SpO2:  [95 %-100 %] 100 % (07/24 1050) Last BM Date: 06/14/18 60 PO 1610 PO 1000 urine Afebrile, VSS Labs stable Film this AM:  Moderate stool burden throughout the colon, decreased since prior study. Decreasing gaseous distention of the colon since prior study.  No bowel obstruction or free air. No suspicious calcification.  Intake/Output from previous day: 07/23 0701 - 07/24 0700 In: 1720.7 [P.O.:60; I.V.:1610.7; IV Piggyback:50] Out: 1000 [Urine:1000] Intake/Output this shift: Total I/O In: 548.1 [P.O.:120; I.V.:428.1] Out: 600 [Urine:600]  General appearance: alert, cooperative and no distress Resp: clear to auscultation bilaterally GI: soft, non-tender; bowel sounds normal; no masses,  no organomegaly  Lab Results:  Recent Labs    06/16/18 0750 06/17/18 0625  WBC 7.4 5.8  HGB 12.7 11.7*  HCT 38.8 36.1  PLT 207 178    BMET Recent Labs    06/16/18 0750 06/17/18 0625  NA 140 141  K 4.0 5.1  CL 102 106  CO2 27 30  GLUCOSE 120* 150*  BUN 18 9  CREATININE 1.02* 1.05*  CALCIUM 9.3 9.1   PT/INR No results for input(s): LABPROT, INR in the last 72 hours.  Recent Labs  Lab 06/16/18 0750  AST 26  ALT 19  ALKPHOS 70  BILITOT 0.6  PROT 6.9  ALBUMIN 3.8     Lipase     Component Value Date/Time   LIPASE 40 06/16/2018 0750   Prior to Admission medications   Medication Sig Start Date End Date Taking? Authorizing Provider  baclofen (LIORESAL) 10 MG tablet TAKE 1 TABLET BY MOUTH AT  BEDTIME Patient taking differently: TAKE 10 MG TABLET BY MOUTH DAILY AT BEDTIME 04/30/18  Yes Marcial Pacas, MD   Biotin 10 MG CAPS Take 10 mg by mouth every morning.    Yes [provider]  buPROPion (WELLBUTRIN SR) 150 MG 12 hr tablet Take 150 mg by mouth daily.  11/10/14  Yes [provider]  calcium carbonate (OS-CAL) 600 MG TABS Take 600 mg by mouth 2 (two) times daily with a meal.   Yes [provider]  Cholecalciferol (VITAMIN D-3) 1000 UNITS CAPS Take 1,000 Units by mouth 2 (two) times daily.   Yes [provider]  FLUoxetine (PROZAC) 20 MG capsule TAKE 1 CAPSULE BY MOUTH  DAILY. TAKES ALONG WITH A  40MG  DAILY. Patient taking differently: TAKE 20 MG CAPSULE BY MOUTH  DAILY. TAKES ALONG WITH A  40MG  DAILY. 01/26/18  Yes Marcial Pacas, MD  FLUoxetine (PROZAC) 40 MG capsule TAKE ONE CAPSULE BY MOUTH  ONCE DAILY WITH 20 MG  CAPSULE Patient taking differently: TAKE 40 MG CAPSULE BY MOUTH ONCE DAILY WITH 20 MG CAPSULE 01/26/18  Yes Marcial Pacas, MD  levothyroxine (SYNTHROID) 25 MCG tablet Take 25 mcg by mouth daily before breakfast.   Yes [provider]  Multiple Vitamins-Minerals (CENTRUM SILVER 50+WOMEN PO) Take 1 capsule by mouth daily.   Yes [provider]  Omega-3 Fatty Acids (FISH OIL) 1200 MG CAPS Take 1,200 mg by mouth daily.   Yes [provider]  oxybutynin (DITROPAN-XL) 10 MG 24  hr tablet Take 10 mg by mouth at bedtime.  07/14/14  Yes [provider]  Probiotic Product (PROBIOTIC DAILY PO) Take 1 capsule by mouth daily.   Yes [provider]  simvastatin (ZOCOR) 20 MG tablet Take 20 mg by mouth every evening.   Yes [provider]  tiZANidine (ZANAFLEX) 4 MG tablet TAKE 1 TABLET BY MOUTH 3  TIMES DAILY Patient taking differently: TAKE 1 TABLET BY MOUTH ONCE DAILY PRN FOR MUSCLE SPASMS 04/30/18  Yes Marcial Pacas, MD  valsartan (DIOVAN) 320 MG tablet Take 320 mg by mouth daily.   Yes [provider]  vitamin B-12 (CYANOCOBALAMIN) 500 MCG tablet Take 500 mcg by mouth daily.   Yes [provider]       Medications: . baclofen  10 mg Oral QHS  . buPROPion  150 mg Oral Daily  . enoxaparin (LOVENOX) injection  40 mg Subcutaneous Q24H  . FLUoxetine  60 mg Oral Daily  . irbesartan  300 mg Oral Daily  . levothyroxine  25 mcg Oral QAC breakfast  . oxybutynin  10 mg Oral QHS  . polyethylene glycol  17 g Oral TID   . dextrose 5 % and 0.45 % NaCl with KCl 20 mEq/L 125 mL/hr at 06/17/18 1213    Assessment/Plan Hx of progressive multiple Sclerosis  Not on Immunomodulation- ambulates with walker/ but has a good deal of trouble walking. 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  Hopefully bowel regieme will help with chronic constipation         LOS: 1 day    Jeryl Wilbourn 06/17/2018 [phone]

## 2018-06-18 LAB — BASIC METABOLIC PANEL
Anion gap: 7 (ref 5–15)
BUN: 6 mg/dL — ABNORMAL LOW (ref 8–23)
CHLORIDE: 105 mmol/L (ref 98–111)
CO2: 30 mmol/L (ref 22–32)
CREATININE: 0.85 mg/dL (ref 0.44–1.00)
Calcium: 8.8 mg/dL — ABNORMAL LOW (ref 8.9–10.3)
GFR calc Af Amer: >60 mL/min (ref >60)
GLUCOSE: 128 mg/dL — AB (ref 70–99)
POTASSIUM: 4.1 mmol/L (ref 3.5–5.1)
SODIUM: 142 mmol/L (ref 135–145)

## 2018-06-18 MED ORDER — TIZANIDINE HCL 2 MG PO TABS
4.0000 mg | ORAL_TABLET | Freq: Three times a day (TID) | ORAL | Status: DC | PRN
  Administered 2018-06-18 – 2018-06-19 (×3): 4 mg via ORAL
  Filled 2018-06-18 (×3): qty 2

## 2018-06-18 MED ORDER — MAGNESIUM HYDROXIDE 400 MG/5ML PO SUSP
960.0000 mL | Freq: Once | ORAL | Status: AC
  Administered 2018-06-18: 960 mL via RECTAL
  Filled 2018-06-18: qty 473

## 2018-06-18 NOTE — Progress Notes (Signed)
Patient ID: Mary Villanueva, female   DOB: 1956/01/11, 62 y.o.   MRN: [id number]       Subjective: Patient feeling well this morning except having bad leg spasms in her right leg.  Tolerating clears.  No BM with enema yesterday, suppository, or miralax so far.  Objective: Vital signs in last 24 hours: Temp:  [98.6 F (37 C)-99.3 F (37.4 C)] 99.3 F (37.4 C) (07/25 0543) Pulse Rate:  [63-66] 65 (07/25 0543) Resp:  [16-19] 16 (07/25 0543) BP: (114-149)/(52-71) 149/69 (07/25 0543) SpO2:  [99 %-100 %] 100 % (07/25 0543) Last BM Date: 06/17/18  Intake/Output from previous day: 07/24 0701 - 07/25 0700 In: 2694.5 [P.O.:360; I.V.:2334.5] Out: 3250 [Urine:3250] Intake/Output this shift: No intake/output data recorded.  PE: Heart: regular Lungs: CTAB Abd: soft, NT, nD,+BS  Lab Results:  Recent Labs    06/16/18 0750 06/17/18 0625  WBC 7.4 5.8  HGB 12.7 11.7*  HCT 38.8 36.1  PLT 207 178   BMET Recent Labs    06/17/18 0625 06/18/18 0354  NA 141 142  K 5.1 4.1  CL 106 105  CO2 30 30  GLUCOSE 150* 128*  BUN 9 6*  CREATININE 1.05* 0.85  CALCIUM 9.1 8.8*   PT/INR No results for input(s): LABPROT, INR in the last 72 hours. CMP     Component Value Date/Time   NA 142 06/18/2018 0354   NA 143 06/02/2018 0808   K 4.1 06/18/2018 0354   CL 105 06/18/2018 0354   CO2 30 06/18/2018 0354   GLUCOSE 128 (H) 06/18/2018 0354   BUN 6 (L) 06/18/2018 0354   BUN 17 06/02/2018 0808   CREATININE 0.85 06/18/2018 0354   CALCIUM 8.8 (L) 06/18/2018 0354   PROT 6.9 06/16/2018 0750   PROT 6.5 06/02/2018 0808   ALBUMIN 3.8 06/16/2018 0750   ALBUMIN 4.3 06/02/2018 0808   AST 26 06/16/2018 0750   ALT 19 06/16/2018 0750   ALKPHOS 70 06/16/2018 0750   BILITOT 0.6 06/16/2018 0750   BILITOT 0.4 06/02/2018 0808   GFRNONAA >60 06/18/2018 0354   GFRAA >60 06/18/2018 0354   Lipase     Component Value Date/Time   LIPASE 40 06/16/2018 0750       Studies/Results: Dg Abd 1  View  Result Date: 06/17/2018 CLINICAL DATA:  Abdominal pain, constipation EXAM: ABDOMEN - 1 VIEW COMPARISON:  Plain film of the abdomen from earlier same day. FINDINGS: Visualized bowel gas pattern is nonobstructive. Moderate amount of stool and gas throughout the nondistended colon. No evidence of soft tissue mass or abnormal fluid collection. No evidence of free intraperitoneal air seen. No evidence of renal or ureteral calculi. No acute or suspicious osseous finding. IMPRESSION: Nonobstructive bowel gas pattern. Moderate amount of stool and gas within the colon. Electronically Signed   By: Franki Cabot M.D.   On: 06/17/2018 18:21   Dg Abd 1 View  Result Date: 06/17/2018 CLINICAL DATA:  Lower abdominal pain for 2 days EXAM: ABDOMEN - 1 VIEW COMPARISON:  CT 06/16/2018 FINDINGS: Moderate stool burden throughout the colon, decreased since prior study. Decreasing gaseous distention of the colon since prior study. No bowel obstruction or free air. No suspicious calcification. IMPRESSION: Moderate stool burden, improving since prior CT. Decreasing gaseous distention. No evidence of bowel obstruction. Electronically Signed   By: Rolm Baptise M.D.   On: 06/17/2018 07:41   Ct Abdomen Pelvis W Contrast  Result Date: 06/16/2018 CLINICAL DATA:  Lower abdominal pain, constipation EXAM: CT ABDOMEN  AND PELVIS WITH CONTRAST TECHNIQUE: Multidetector CT imaging of the abdomen and pelvis was performed using the standard protocol following bolus administration of intravenous contrast. CONTRAST:  154mL OMNIPAQUE IOHEXOL 300 MG/ML  SOLN COMPARISON:  03/17/2017 FINDINGS: Lower chest: Lung bases are clear. No effusions. Heart is normal size. Hepatobiliary: No focal hepatic abnormality. Gallbladder unremarkable. Pancreas: No focal abnormality or ductal dilatation. Spleen: No focal abnormality.  Normal size. Adrenals/Urinary Tract: No adrenal abnormality. No focal renal abnormality. No stones or hydronephrosis. Urinary bladder  is unremarkable. Stomach/Bowel: Moderate stool burden throughout the colon. Gaseous distention of the right colon. The cecum is noted in the upper abdomen near the midline just below the liver with large amount of gas and stool noted in the cecum and proximal ascending colon. Appendix is partially gas-filled. No evidence of appendicitis. Stomach and small bowel decompressed, grossly unremarkable. Reproductive: Uterine fibroids suspected.  No adnexal mass. Other: No free fluid or free air. Musculoskeletal: No acute bony abnormality. IMPRESSION: Significant focal gaseous distention of the cecum and proximal ascending colon. The cecum is located in the upper abdomen near the midline just inferior to the bladder. The distal ascending colon and remainder of the colon is decompressed with moderate stool burden throughout the colon. Cannot completely exclude cecal volvulus. Appendix normal. Fibroid uterus. Aortic atherosclerosis. Electronically Signed   By: Rolm Baptise M.D.   On: 06/16/2018 10:44    Anti-infectives: Anti-infectives (From admission, onward)   None       Assessment/Plan Abdominal pain, constipation -SMOG enema today -prn suppository -TID miralax -DC home once moving her bowels to regimen of BID miralax -soft diet  MS: on home meds, increase Xanaflex to TID prn for muscle spasms  FEN: SLIV/soft diet VTE; Lovenox ID: none   LOS: 2 days    Henreitta Cea , [facility] 06/18/2018, 8:35 AM Pager: [phone]

## 2018-06-19 MED ORDER — MAGNESIUM CITRATE PO SOLN
2.0000 | Freq: Once | ORAL | Status: AC
  Administered 2018-06-19: 2 via ORAL
  Filled 2018-06-19: qty 592

## 2018-06-19 NOTE — Care Management Obs Status (Signed)
[facility] NOTIFICATION   Patient Details  Name: Mary Villanueva MRN: [id number] Date of Birth: [birth year]   Medicare Observation Status Notification Given:  Yes    Carles Collet, RN 06/19/2018, 2:16 PM

## 2018-06-19 NOTE — Care Management Important Message (Signed)
Important Message  Patient Details  Name: Mary Villanueva MRN: [id number] Date of Birth: October 06, 1956   Medicare Important Message Given:  Yes    Orbie Pyo 06/19/2018, 3:39 PM

## 2018-06-19 NOTE — Progress Notes (Signed)
Patient has 3 episodes of BMs today starting from solid stools then went to liquid stools on the 3rd episodes.  Feeling much better now.

## 2018-06-19 NOTE — Plan of Care (Addendum)
  Problem: Clinical Measurements: Goal: Ability to maintain clinical measurements within normal limits will improve Outcome: Progressing Goal: Will remain free from infection Outcome: Adequate for Discharge Goal: Respiratory complications will improve Outcome: Adequate for Discharge Goal: Cardiovascular complication will be avoided Outcome: Adequate for Discharge   Problem: Activity: Goal: Risk for activity intolerance will decrease Outcome: Adequate for Discharge   Problem: Nutrition: Goal: Adequate nutrition will be maintained Outcome: Adequate for Discharge   Problem: Coping: Goal: Level of anxiety will decrease Outcome: Adequate for Discharge   Problem: Elimination: Goal: Will not experience complications related to bowel motility Outcome: Adequate for Discharge   Problem: Pain Managment: Goal: General experience of comfort will improve Outcome: Adequate for Discharge   Problem: Safety: Goal: Ability to remain free from injury will improve Outcome: Adequate for Discharge   Problem: Skin Integrity: Goal: Risk for impaired skin integrity will decrease Outcome: Adequate for Discharge

## 2018-06-19 NOTE — Progress Notes (Signed)
   Subjective/Chief Complaint: Pt with no c/o this AM Still no BM despite bowel regimen thus far No abd pain   Objective: Vital signs in last 24 hours: Temp:  [98.5 F (36.9 C)-99.1 F (37.3 C)] 98.5 F (36.9 C) (07/26 0537) Pulse Rate:  [72] 72 (07/26 0537) Resp:  [18] 18 (07/26 0537) BP: (123-128)/(58-67) 128/67 (07/26 0537) SpO2:  [99 %-100 %] 99 % (07/26 0537) Last BM Date: 06/18/18(very small)  Intake/Output from previous day: 07/25 0701 - 07/26 0700 In: 1140 [P.O.:1140] Out: 1200 [Urine:1200] Intake/Output this shift: Total I/O In: -  Out: 300 [Urine:300]  Constitutional: No acute distress, conversant, appears states age. Eyes: Anicteric sclerae, moist conjunctiva, no lid lag Lungs: Clear to auscultation bilaterally, normal respiratory effort CV: regular rate and rhythm, no murmurs, no peripheral edema, pedal pulses 2+ GI: Soft, no masses or hepatosplenomegaly, non-tender to palpation, active BS Skin: No rashes, palpation reveals normal turgor Psychiatric: appropriate judgment and insight, oriented to person, place, and time   Lab Results:  Recent Labs    06/17/18 0625  WBC 5.8  HGB 11.7*  HCT 36.1  PLT 178   BMET Recent Labs    06/17/18 0625 06/18/18 0354  NA 141 142  K 5.1 4.1  CL 106 105  CO2 30 30  GLUCOSE 150* 128*  BUN 9 6*  CREATININE 1.05* 0.85  CALCIUM 9.1 8.8*   PT/INR No results for input(s): LABPROT, INR in the last 72 hours. ABG No results for input(s): PHART, HCO3 in the last 72 hours.  Invalid input(s): PCO2, PO2  Studies/Results: Dg Abd 1 View  Result Date: 06/17/2018 CLINICAL DATA:  Abdominal pain, constipation EXAM: ABDOMEN - 1 VIEW COMPARISON:  Plain film of the abdomen from earlier same day. FINDINGS: Visualized bowel gas pattern is nonobstructive. Moderate amount of stool and gas throughout the nondistended colon. No evidence of soft tissue mass or abnormal fluid collection. No evidence of free intraperitoneal air  seen. No evidence of renal or ureteral calculi. No acute or suspicious osseous finding. IMPRESSION: Nonobstructive bowel gas pattern. Moderate amount of stool and gas within the colon. Electronically Signed   By: Franki Cabot M.D.   On: 06/17/2018 18:21    Anti-infectives: Anti-infectives (From admission, onward)   None      Assessment/Plan: Abdominal pain, constipation -Mag Citrate x 2 bottles today -prn suppository -TID miralax -DC home once moving her bowels to regimen of BID miralax -soft diet  MS: on home meds, increase Xanaflex to TID prn for muscle spasms  FEN: SLIV/soft diet VTE; Lovenox ID: none    LOS: 3 days    Rosario Jacks., Anne Hahn 06/19/2018

## 2018-06-19 NOTE — Care Management CC44 (Signed)
Condition Code 44 Documentation Completed  Patient Details  Name: Mary Villanueva Eye MRN: [id number] Date of Birth: 01/06/1956   Condition Code 44 given:  Yes Patient signature on Condition Code 44 notice:  Yes Documentation of 2 MD's agreement:  Yes Code 44 added to claim:  Yes    Carles Collet, RN 06/19/2018, 2:16 PM

## 2018-06-20 NOTE — Discharge Summary (Signed)
Physician Discharge Summary  Patient ID: Mary Villanueva MRN: [id number] DOB/AGE: 04-04-1956 62 y.o.  PCP: [name], MD  Admit date: 06/16/2018 Discharge date: 06/20/2018  Admission Diagnoses:  Abdominal pain  Discharge Diagnoses:  Chronic constipation  Active Problems:   Abdominal pain   Surgery:  none  Discharged Condition: improved  Hospital Course:   Admitted with abdominal pain and xrays showed constipation.  Different laxatives were given and finally she had large results-large BM Feeling better and desiring discharge.  Consults: none  Significant Diagnostic Studies: none    Discharge Exam: Blood pressure 132/65, pulse 82, temperature 98.4 F (36.9 C), temperature source Oral, resp. rate 14, height 5\' 1"  (1.549 m), weight 64.9 kg (143 lb), SpO2 99 %. Abdominal discomfort much improved  Disposition: Discharge disposition: 01-Home or Self Care       Discharge Instructions    Diet - low sodium heart healthy   Complete by:  As directed    Discharge instructions   Complete by:  As directed    Avoid constipation   Increase activity slowly   Complete by:  As directed      Allergies as of 06/20/2018   No Known Allergies     Medication List    TAKE these medications   baclofen 10 MG tablet Commonly known as:  LIORESAL TAKE 1 TABLET BY MOUTH AT  BEDTIME What changed:    how much to take  how to take this  when to take this   Biotin 10 MG Caps Take 10 mg by mouth every morning.   buPROPion 150 MG 12 hr tablet Commonly known as:  WELLBUTRIN SR Take 150 mg by mouth daily.   calcium carbonate 600 MG Tabs tablet Commonly known as:  OS-CAL Take 600 mg by mouth 2 (two) times daily with a meal.   CENTRUM SILVER 50+WOMEN PO Take 1 capsule by mouth daily.   DIOVAN 320 MG tablet Generic drug:  valsartan Take 320 mg by mouth daily.   Fish Oil 1200 MG Caps Take 1,200 mg by mouth daily.   FLUoxetine 20 MG capsule Commonly known as:   PROZAC TAKE 1 CAPSULE BY MOUTH  DAILY. TAKES ALONG WITH A  40MG  DAILY. What changed:  See the new instructions.   FLUoxetine 40 MG capsule Commonly known as:  PROZAC TAKE ONE CAPSULE BY MOUTH  ONCE DAILY WITH 20 MG  CAPSULE What changed:  See the new instructions.   oxybutynin 10 MG 24 hr tablet Commonly known as:  DITROPAN-XL Take 10 mg by mouth at bedtime.   PROBIOTIC DAILY PO Take 1 capsule by mouth daily.   simvastatin 20 MG tablet Commonly known as:  ZOCOR Take 20 mg by mouth every evening.   SYNTHROID 25 MCG tablet Generic drug:  levothyroxine Take 25 mcg by mouth daily before breakfast.   tiZANidine 4 MG tablet Commonly known as:  ZANAFLEX TAKE 1 TABLET BY MOUTH 3  TIMES DAILY What changed:    how much to take  how to take this  when to take this   vitamin B-12 500 MCG tablet Commonly known as:  CYANOCOBALAMIN Take 500 mcg by mouth daily.   Vitamin D-3 1000 units Caps Take 1,000 Units by mouth 2 (two) times daily.      Follow-up Information    [name], MD Follow up.   Specialty:  Internal Medicine Contact information: [address] [place] [address] [phone]           Signed:  Pedro Earls 06/20/2018, 10:45 AM

## 2018-06-20 NOTE — Discharge Instructions (Signed)

## 2018-08-06 ENCOUNTER — Other Ambulatory Visit: Payer: Self-pay | Admitting: Neurology

## 2018-10-12 ENCOUNTER — Telehealth: Payer: Self-pay | Admitting: Neurology

## 2018-10-12 NOTE — Telephone Encounter (Signed)
Patient states has been falling a lot and needs to be seen. She has MS.

## 2018-10-12 NOTE — Telephone Encounter (Signed)
MS patient.  Returned call - she has been falling more recently.  She would like to be further evaluated.  She has been placed on the schedule to see Dr. Krista Blue on 10/14/18.

## 2018-10-14 ENCOUNTER — Ambulatory Visit: Payer: Medicare Other | Admitting: Neurology

## 2018-10-14 ENCOUNTER — Encounter: Payer: Self-pay | Admitting: Neurology

## 2018-10-14 ENCOUNTER — Telehealth: Payer: Self-pay | Admitting: Neurology

## 2018-10-14 VITALS — BP 120/60 | HR 75 | Ht 61.0 in | Wt 147.0 lb

## 2018-10-14 DIAGNOSIS — R269 Unspecified abnormalities of gait and mobility: Secondary | ICD-10-CM | POA: Diagnosis not present

## 2018-10-14 DIAGNOSIS — G35 Multiple sclerosis: Secondary | ICD-10-CM
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
## 2018-10-14 NOTE — Telephone Encounter (Signed)
[employer] medicare order sent to GI. No auth they will reach out to the pt to schedule.  °

## 2018-10-14 NOTE — Telephone Encounter (Signed)
Patient is aware I gave her GI phone number of [phone] and to give them a call if she has not heard in the next 2-3 business days.  °

## 2018-10-15 ENCOUNTER — Telehealth: Payer: Self-pay | Admitting: *Deleted

## 2018-10-15 LAB — URINALYSIS
Bilirubin, UA: NEGATIVE
Glucose, UA: NEGATIVE
Ketones, UA: NEGATIVE
Leukocytes, UA: NEGATIVE
NITRITE UA: NEGATIVE
PH UA: 7.5 (ref 5.0–7.5)
Protein, UA: NEGATIVE
RBC UA: NEGATIVE
SPEC GRAV UA: 1.016 (ref 1.005–1.030)
UUROB: 0.2 mg/dL (ref 0.2–1.0)

## 2018-10-15 NOTE — Telephone Encounter (Signed)
-----   Message from Marcial Pacas, MD sent at 10/15/2018  8:28 AM EST ----- Please call patient for normal UA, no evidence of urinary tract infection

## 2018-10-15 NOTE — Telephone Encounter (Signed)
Spoke to patient - she is aware of her results. 

## 2018-10-16 ENCOUNTER — Other Ambulatory Visit: Payer: Self-pay

## 2018-10-16 ENCOUNTER — Ambulatory Visit: Payer: Medicare Other | Attending: Neurology | Admitting: Physical Therapy

## 2018-10-16 ENCOUNTER — Encounter: Payer: Self-pay | Admitting: Physical Therapy

## 2018-10-16 DIAGNOSIS — M6281 Muscle weakness (generalized): Secondary | ICD-10-CM | POA: Diagnosis present

## 2018-10-16 DIAGNOSIS — R2689 Other abnormalities of gait and mobility: Secondary | ICD-10-CM | POA: Insufficient documentation

## 2018-10-16 DIAGNOSIS — Z9181 History of falling: Secondary | ICD-10-CM

## 2018-10-16 DIAGNOSIS — R2681 Unsteadiness on feet: Secondary | ICD-10-CM | POA: Diagnosis not present

## 2018-10-16 NOTE — Therapy (Signed)
[facility] [address] [place], [place], [address] Phone: [phone]   Fax:  [phone]  Physical Therapy Evaluation  Patient Details  Name: Mary Villanueva MRN: [id number] Date of Birth: 1956-06-02 Referring Provider (Mary Villanueva): Dr. Marcial Pacas, M.D.   Encounter Date: 10/16/2018  Mary Villanueva End of Session - 10/16/18 1125    Visit Number  1    Number of Visits  17    Date for Mary Villanueva Re-Evaluation  12/25/18    Authorization Type  [employer] Medicare    Mary Villanueva Start Time  1010    Mary Villanueva Stop Time  1058    Mary Villanueva Time Calculation (min)  48 min    Activity Tolerance  Patient tolerated treatment well    Behavior During Therapy  WFL for tasks assessed/performed       Past Medical History:  Diagnosis Date  . Abdominal pain 05/2018  . Depression   . High cholesterol   . Hypercholesteremia   . Hypertension   . MS (multiple sclerosis) ([place])     Past Surgical History:  Procedure Laterality Date  . BREAST REDUCTION SURGERY  1998    There were no vitals filed for this visit.   Subjective Assessment - 10/16/18 0928    Subjective  Patient reports a history of falling. Feels like R kne is giving out (buckles). Ambulates with rollator all the time. Has an electric scooter, but only uses for distance. 10 falls in the past 6 months - no significant injury. Not currently on MS medication due to side effects. Does have numbness and spasticity at LE. Only wears compression socks. Has never been in bracing. Has been to therapy before, but quite some time ago. Currently not on an exercise regimen.     Pertinent History  MS, HTN (controlled), overactive bladder, depression    Patient Stated Goals  "I want to quit falling"    Currently in Pain?  No/denies    Multiple Pain Sites  No         [facility] Mary Villanueva Assessment - 10/16/18 0932      Assessment   Medical Diagnosis  MS; gait abnormality    Referring Provider (Mary Villanueva)  Dr. Marcial Pacas, M.D.    Onset Date/Surgical  Date  --   referred on 10/14/18 due to recent increase in falls   Prior Therapy  some time ago      Precautions   Precautions  Fall      Restrictions   Weight Bearing Restrictions  No      Balance Screen   Has the patient fallen in the past 6 months  Yes    How many times?  10    Has the patient had a decrease in activity level because of a fear of falling?   No    Is the patient reluctant to leave their home because of a fear of falling?   No      Home Environment   Living Environment  Private residence    Living Arrangements  Alone    Type of [place] Access  Level entry    Home Layout  One level    [place] - 4 wheels   handicapped height toilet   Additional Comments  uses walker at all times, even within house      Prior Function   Level of Independence  Independent with household mobility with device;Independent with community mobility with device  drives independently   Vocation  Retired    Leisure  watching football, being with family      Cognition   Overall Cognitive Status  Within Functional Limits for tasks assessed      Sensation   Light Touch  Appears Intact      Coordination   Gross Motor Movements are Fluid and Coordinated  Yes    Finger Nose Finger Test  normal    Heel Shin Test  normal      Posture/Postural Control   Posture/Postural Control  Postural limitations    Postural Limitations  Forward head;Rounded Shoulders;Posterior pelvic tilt      Tone   Assessment Location  Right Lower Extremity;Left Lower Extremity      ROM / Strength   AROM / PROM / Strength  Strength      Strength   Strength Assessment Site  Hip;Knee;Ankle    Right/Left Hip  Right;Left    Right Hip Flexion  3+/5    Left Hip Flexion  4-/5    Right/Left Knee  Right;Left    Right Knee Flexion  4/5    Right Knee Extension  4/5    Left Knee Flexion  3/5    Left Knee Extension  4-/5    Right/Left Ankle  Right;Left    Right Ankle Dorsiflexion  4/5     Left Ankle Dorsiflexion  3/5      Transfers   Transfers  Sit to Stand;Stand to Sit    Sit to Stand  5: Supervision;6: Modified independent (Device/Increase time);With upper extremity assist;With armrests;From chair/3-in-1;Uncontrolled descent    Sit to Stand Details (indicate cue type and reason)  requires cueing for safety - patient did not lock brakes;     Stand to Sit  6: Modified independent (Device/Increase time);5: Supervision;With upper extremity assist;With armrests;To chair/3-in-1;Uncontrolled descent    Stand to Sit Details  uncontrolled descent - reports this at baseline; chair against wall or with Mary Villanueva holding chair to not move due to instability      Ambulation/Gait   Ambulation/Gait  Yes    Ambulation/Gait Assistance  6: Modified independent (Device/Increase time);5: Supervision    Ambulation/Gait Assistance Details  supervision for safety due to reports of R knee buckling with mobility - seemingly with "episodic spasticity" during static stance with slight buckle; noted R knee valgus and with weight bearing on lateral aspect of L foot    Ambulation Distance (Feet)  50 Feet    Assistive device  4-wheeled walker    Gait Pattern  Step-to pattern;Step-through pattern;Decreased step length - right;Decreased step length - left;Decreased hip/knee flexion - right;Decreased hip/knee flexion - left;Decreased dorsiflexion - right;Decreased dorsiflexion - left;Trunk flexed    Ambulation Surface  Level;Indoor    Gait velocity  0.94 ft/sec      Balance   Balance Assessed  Yes      Standardized Balance Assessment   Standardized Balance Assessment  Berg Balance Test;Timed Up and Go Test;Five Times Sit to Stand    Five times sit to stand comments   31.2   use of arm rests and support at rollator     Berg Balance Test   Sit to Stand  Able to stand  independently using hands    Standing Unsupported  Unable to stand 30 seconds unassisted    Sitting with Back Unsupported but Feet Supported  on Floor or Stool  Able to sit safely and securely 2 minutes    Stand to Sit  Sits independently,  has uncontrolled descent    Transfers  Able to transfer safely, definite need of hands    Standing Unsupported with Eyes Closed  Able to stand 10 seconds with supervision    Standing Ubsupported with Feet Together  Able to place feet together independently and stand for 1 minute with supervision    From Standing, Reach Forward with Outstretched Arm  Reaches forward but needs supervision    From Standing Position, Pick up Object from Floor  Unable to pick up and needs supervision    From Standing Position, Turn to Look Behind Over each Shoulder  Turn sideways only but maintains balance    Turn 360 Degrees  Needs assistance while turning    Standing Unsupported, Alternately Place Feet on Step/Stool  Able to complete >2 steps/needs minimal assist    Standing Unsupported, One Foot in Front  Needs help to step but can hold 15 seconds    Standing on One Leg  Unable to try or needs assist to prevent fall    Total Score  23      Timed Up and Go Test   Normal TUG (seconds)  38.47   use of AD     RLE Tone   RLE Tone  Moderate      LLE Tone   LLE Tone  Moderate                Objective measurements completed on examination: See above findings.              Mary Villanueva Education - 10/16/18 1124    Education Details  exam findings, fall risk measures, goal establishment and POC    Person(s) Educated  Patient    Methods  Explanation    Comprehension  Verbalized understanding       Mary Villanueva Short Term Goals - 10/16/18 1133      Mary Villanueva SHORT TERM GOAL #1   Title  patient to be independent with initial HEP for strength and balance    Time  4    Period  Weeks    Status  New    Target Date  11/20/18      Mary Villanueva SHORT TERM GOAL #2   Title  patient to improve Berg by >/= 4 points     Time  4    Period  Weeks    Status  New    Target Date  11/20/18      Mary Villanueva SHORT TERM GOAL #3   Title  patient  to improve gait speed by >/= 0.8 ft/sec demonstrating improved functional mobility    Time  4    Period  Weeks    Status  New    Target Date  11/20/18      Mary Villanueva SHORT TERM GOAL #4   Title  patient to demonstrate reduced TUG score by >/= 6 seconds     Time  4    Period  Weeks    Status  New    Target Date  11/20/18        Mary Villanueva Long Term Goals - 10/16/18 1134      Mary Villanueva LONG TERM GOAL #1   Title  patient to be independent with advanced HEP    Time  8    Period  Weeks    Status  New    Target Date  12/25/18      Mary Villanueva LONG TERM GOAL #2   Title  patient to demonstrate improved BERG  by >/=  8 points from baseline     Time  8    Period  Weeks    Status  New    Target Date  12/25/18      Mary Villanueva LONG TERM GOAL #3   Title  patient to improve 5x STS by >/= 8 seconds     Time  8    Status  New    Target Date  12/25/18      Mary Villanueva LONG TERM GOAL #4   Title  patient to demonstrate gait with/without bracing with LRAD over various levels and surfaces at Mod I without knee buckle or instability demosntrating reduced fall risk    Time  8    Period  Weeks    Status  New    Target Date  12/25/18      Mary Villanueva LONG TERM GOAL #5   Title  patient to verbalize fall prevention strategies and report no falls in >/= 4 weeks    Time  8    Period  Weeks    Status  New    Target Date  12/25/18             Plan - 10/16/18 1126    Clinical Impression Statement  Mary Villanueva is a very pleasant 62 y/o female presenting to [facility] today regarding primary complaints of reduced balance with increased fall recently and reports up to 10 falls within the past 6 months. Patient requires use of 4-wheeled rollator at all times due to instability, however does report R knee buckle and fall even with device. Patient scoring in high fall risk on Berg, TUG, and 5x sit to stand as well as noted reduced gait speed below that of a safe community ambulator (see values above). Patient with noted altered gait mechaincs with R knee  valgus and weight bearing on lateral aspect of L foot increasing instability. Patient to benefit from skilled Mary Villanueva intervention to address the above deficits to allow for improved strength, balance, reduced fall risk, and overall improved safe and independent functional mobility.     History and Personal Factors relevant to plan of care:  MS, HTN (controlled), overactive bladder, depression    Clinical Presentation  Evolving    Clinical Presentation due to:  MS, HTN (controlled), overactive bladder, depression, use of mechanical scooter for long distances, history of falls    Clinical Decision Making  Moderate    Rehab Potential  Good    Mary Villanueva Frequency  2x / week    Mary Villanueva Duration  8 weeks    Mary Villanueva Treatment/Interventions  ADLs/Self Care Home Management;Aquatic Therapy;Biofeedback;Electrical Stimulation;Therapeutic exercise;Therapeutic activities;Functional mobility training;Stair training;Gait training;DME Instruction;Balance training;Neuromuscular re-education;Patient/family education;Orthotic Fit/Training;Prosthetic Training;Manual techniques;Taping;Splinting;Passive range of motion    Mary Villanueva Next Visit Plan  initiate strength and balance HEP    Consulted and Agree with Plan of Care  Patient       Patient will benefit from skilled therapeutic intervention in order to improve the following deficits and impairments:  Abnormal gait, Decreased balance, Decreased range of motion, Decreased mobility, Difficulty walking, Decreased strength, Decreased endurance, Impaired tone  Visit Diagnosis: Unsteadiness on feet  Other abnormalities of gait and mobility  History of falling  Muscle weakness (generalized)     Problem List Patient Active Problem List   Diagnosis Date Noted  . Gait abnormality 10/14/2018  . Abdominal pain 06/16/2018  . 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 ([place]) 07/28/2013  . Hypertension   . Depression   . High cholesterol   .  MS (multiple sclerosis) ([place])   .  Hypercholesteremia   . Multiple sclerosis ([place]) 03/22/2013     Mary Villanueva, Mary Villanueva, Mary Villanueva Supplemental Physical Therapist 10/16/18 11:38 AM Pager: [phone] Office: [facility] [address] [place], [place], [address] Phone: [phone]   Fax:  [phone]  Name: Mary Villanueva MRN: [id number] Date of Birth: 12-16-1955

## 2018-10-29 ENCOUNTER — Other Ambulatory Visit: Payer: Self-pay | Admitting: Neurology

## 2018-10-30 ENCOUNTER — Encounter: Payer: Self-pay | Admitting: Rehabilitative and Restorative Service Providers"

## 2018-10-30 ENCOUNTER — Ambulatory Visit: Payer: Medicare Other | Attending: Neurology | Admitting: Rehabilitative and Restorative Service Providers"

## 2018-10-30 DIAGNOSIS — M6281 Muscle weakness (generalized): Secondary | ICD-10-CM | POA: Diagnosis present

## 2018-10-30 DIAGNOSIS — Z9181 History of falling: Secondary | ICD-10-CM

## 2018-10-30 DIAGNOSIS — R2681 Unsteadiness on feet: Secondary | ICD-10-CM

## 2018-10-30 DIAGNOSIS — R2689 Other abnormalities of gait and mobility: Secondary | ICD-10-CM

## 2018-10-30 NOTE — Patient Instructions (Addendum)
  Access Code: [email or web address]  URL: [email or web address]  Date: 10/30/2018  Prepared by: Rudell Cobb   Exercises Supine Hip Adductor Stretch - 3 reps - 1 sets - 30 hold - 1-2x daily                            - 7x weekly Supine March - 10 reps - 2 sets - 1-2x daily - 7x weekly

## 2018-10-30 NOTE — Therapy (Signed)
[facility] [address] [place], [place], [address] Phone: [phone]   Fax:  [phone]  Physical Therapy Treatment  Patient Details  Name: Mary Villanueva MRN: [id number] Date of Birth: Feb 28, 1956 Referring Provider (PT): Dr. Marcial Pacas, M.D.   Encounter Date: 10/30/2018  PT End of Session - 10/30/18 1414    Visit Number  2    Number of Visits  17    Date for PT Re-Evaluation  12/25/18    Authorization Type  [employer] Medicare    PT Start Time  1410    PT Stop Time  1450    PT Time Calculation (min)  40 min    Activity Tolerance  Patient tolerated treatment well    Behavior During Therapy  WFL for tasks assessed/performed       Past Medical History:  Diagnosis Date  . Abdominal pain 05/2018  . Depression   . High cholesterol   . Hypercholesteremia   . Hypertension   . MS (multiple sclerosis) ([place])     Past Surgical History:  Procedure Laterality Date  . BREAST REDUCTION SURGERY  1998    There were no vitals filed for this visit.  Subjective Assessment - 10/30/18 1413    Subjective  The frequency of falls are new and she notes she is falling even when using the walker.      Pertinent History  MS, HTN (controlled), overactive bladder, depression    Patient Stated Goals  "I want to quit falling"    Currently in Pain?  No/denies                       [facility] Adult PT Treatment/Exercise - 10/30/18 1716      Ambulation/Gait   Ambulation/Gait  Yes    Ambulation/Gait Assistance  6: Modified independent (Device/Increase time)    Ambulation/Gait Assistance Details  Trialed L air cast to determine if it will impact ankle inversion during stance phase of gait and provide stability-- patient still walks on outside border of the left foot due to spasticity and has ankle instability in med/lateral plane.  Also tried R foot medial wedge to decreased R foot pronation and therefore, decrease R knee  valgus.  This improved knee position when waring the air cast, but not once the air cast was removed.    Ambulation Distance (Feet)  115 Feet   100 ft walking into clinic   Assistive device  4-wheeled walker    Ambulation Surface  Level;Indoor      Exercises   Exercises  Other Exercises    Other Exercises   supine butterfly stretch with PT providing passive overpressure, attempted supine hamstring stretch, however hard to isolate one LE due to muscle tone, PROM hamstring stretch, PROM thomas test stretch, supine marching x 10 reps.  Seated ankle stretching with limitations due to rigidiity from muscle tone.  Cannot move the left foot out of PF and inversion.              PT Education - 10/30/18 1715    Education Details  supine march and butterfly stretch supine    Person(s) Educated  Patient    Methods  Explanation;Demonstration;Handout    Comprehension  Returned demonstration;Verbalized understanding       PT Short Term Goals - 10/16/18 1133      PT SHORT TERM GOAL #1   Title  patient to be independent with initial HEP for strength and balance  Time  4    Period  Weeks    Status  New    Target Date  11/20/18      PT SHORT TERM GOAL #2   Title  patient to improve Berg by >/= 4 points     Time  4    Period  Weeks    Status  New    Target Date  11/20/18      PT SHORT TERM GOAL #3   Title  patient to improve gait speed by >/= 0.8 ft/sec demonstrating improved functional mobility    Time  4    Period  Weeks    Status  New    Target Date  11/20/18      PT SHORT TERM GOAL #4   Title  patient to demonstrate reduced TUG score by >/= 6 seconds     Time  4    Period  Weeks    Status  New    Target Date  11/20/18        PT Long Term Goals - 10/16/18 1134      PT LONG TERM GOAL #1   Title  patient to be independent with advanced HEP    Time  8    Period  Weeks    Status  New    Target Date  12/25/18      PT LONG TERM GOAL #2   Title  patient to demonstrate  improved BERG  by >/= 8 points from baseline     Time  8    Period  Weeks    Status  New    Target Date  12/25/18      PT LONG TERM GOAL #3   Title  patient to improve 5x STS by >/= 8 seconds     Time  8    Status  New    Target Date  12/25/18      PT LONG TERM GOAL #4   Title  patient to demonstrate gait with/without bracing with LRAD over various levels and surfaces at Mod I without knee buckle or instability demosntrating reduced fall risk    Time  8    Period  Weeks    Status  New    Target Date  12/25/18      PT LONG TERM GOAL #5   Title  patient to verbalize fall prevention strategies and report no falls in >/= 4 weeks    Time  8    Period  Weeks    Status  New    Target Date  12/25/18            Plan - 10/30/18 1720    Clinical Impression Statement  The patient's L ankle spasticity limits stability even when trialing an air cast.  PT to continue to search for best way to stretch at home and to discuss tone mgmt with patient/patient's MD.  The patient notes she stopped getting botox b/c she felt it was not helpful.  The L ankle joint is significantly limited in ROM, and has significant spasticity.  This decreases her weight shift to the left, and therefore, she hits R stance with valgus knee in flexed position making her more prone to buckle.  To improve mechanics, we need to determine best way to accomodate to L ankle position.     PT Treatment/Interventions  ADLs/Self Care Home Management;Aquatic Therapy;Biofeedback;Electrical Stimulation;Therapeutic exercise;Therapeutic activities;Functional mobility training;Stair training;Gait training;DME Instruction;Balance training;Neuromuscular re-education;Patient/family education;Orthotic Fit/Training;Prosthetic Training;Manual techniques;Taping;Splinting;Passive range  of motion    PT Next Visit Plan  ankle stretching, LE stretching, ankle positioning, trialing shoe/brace to modify ankle position.    Consulted and Agree with Plan  of Care  Patient       Patient will benefit from skilled therapeutic intervention in order to improve the following deficits and impairments:  Abnormal gait, Decreased balance, Decreased range of motion, Decreased mobility, Difficulty walking, Decreased strength, Decreased endurance, Impaired tone  Visit Diagnosis: Unsteadiness on feet  Other abnormalities of gait and mobility  History of falling  Muscle weakness (generalized)     Problem List Patient Active Problem List   Diagnosis Date Noted  . Gait abnormality 10/14/2018  . Abdominal pain 06/16/2018  . 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 ([place]) 07/28/2013  . Hypertension   . Depression   . High cholesterol   . MS (multiple sclerosis) ([place])   . Hypercholesteremia   . Multiple sclerosis ([place]) 03/22/2013    [place], PT 10/30/2018, 5:24 PM  [facility] [address] [place], [place], [address] Phone: [phone]   Fax:  [phone]  Name: Mary Villanueva MRN: [id number] Date of Birth: [birth year]

## 2018-11-02 ENCOUNTER — Encounter: Payer: Self-pay | Admitting: Physical Therapy

## 2018-11-02 ENCOUNTER — Ambulatory Visit: Payer: Medicare Other | Admitting: Physical Therapy

## 2018-11-02 DIAGNOSIS — M6281 Muscle weakness (generalized): Secondary | ICD-10-CM

## 2018-11-02 DIAGNOSIS — R2681 Unsteadiness on feet: Secondary | ICD-10-CM | POA: Diagnosis not present

## 2018-11-02 DIAGNOSIS — R2689 Other abnormalities of gait and mobility: Secondary | ICD-10-CM

## 2018-11-02 DIAGNOSIS — Z9181 History of falling: Secondary | ICD-10-CM

## 2018-11-02 NOTE — Patient Instructions (Signed)
Access Code: [email or web address]  URL: [email or web address]  Date: 11/02/2018  Prepared by: Barry Brunner   Exercises  Supine March - 10 reps - 2 sets - 1-2x daily - 7x weekly  Supine Hip Adductor Stretch - 3 reps - 1 sets - 30 hold - 1-2x daily - 7x weekly  seated or standing calf stretch - 3 reps - 1 sets - 30 hold - 2x daily - 7x weekly   *Note-Educated pt to do her marching ex first (this helped with lt knee flexion for better positioning when doing supine adductor stretch.   Attempted calf stretch in standing, however pt hyperextends left knee, therefore instructed to do in sitting

## 2018-11-02 NOTE — Therapy (Signed)
[facility] [address] [place], [place], [address] Phone: [phone]   Fax:  [phone]  Physical Therapy Treatment  Patient Details  Name: Mary Villanueva MRN: [id number] Date of Birth: [birth year] Referring Provider (PT): Dr. Marcial Pacas, M.D.   Encounter Date: 11/02/2018  PT End of Session - 11/02/18 1608    Visit Number  3    Number of Visits  17    Date for PT Re-Evaluation  12/25/18    Authorization Type  [employer] Medicare    PT Start Time  1448    PT Stop Time  1540    PT Time Calculation (min)  52 min    Equipment Utilized During Treatment  Right knee immobilizer    Activity Tolerance  Patient tolerated treatment well    Behavior During Therapy  WFL for tasks assessed/performed       Past Medical History:  Diagnosis Date  . Abdominal pain 05/2018  . Depression   . High cholesterol   . Hypercholesteremia   . Hypertension   . MS (multiple sclerosis) ([place])     Past Surgical History:  Procedure Laterality Date  . BREAST REDUCTION SURGERY  1998    There were no vitals filed for this visit.  Subjective Assessment - 11/02/18 1554    Subjective  Reports no falls since last visit, however had a near fall earlier today (was using her walker and thinks Rt knee gave away). Reports she never made it home with her schedule or HEP (not sure  where she lost them) but was not able to remember HEP to work on it. (Pt given additional printout of appts and HEP)    Pertinent History  MS, HTN (controlled), overactive bladder, depression    Patient Stated Goals  "I want to quit falling"    Currently in Pain?  No/denies                       [facility] Adult PT Treatment/Exercise - 11/02/18 1557      Ambulation/Gait   Ambulation/Gait  Yes    Ambulation/Gait Assistance  6: Modified independent (Device/Increase time)    Ambulation/Gait Assistance Details  trialed  knee immobilizer on rt knee to assess  changes to gait if hinged braced were utilized to provide medial/lateral support. Knee immobilzer was only slightly able to provide support to reduce rt knee valgus. Pt was able to advance RLE and clear rt toes/foot    Ambulation Distance (Feet)  50 Feet   100 ft walking into clinic   Assistive device  4-wheeled walker    Gait Pattern  Step-to pattern;Step-through pattern;Decreased step length - right;Decreased step length - left;Decreased hip/knee flexion - right;Decreased hip/knee flexion - left;Decreased dorsiflexion - right;Decreased dorsiflexion - left;Trunk flexed      Exercises   Exercises  Other Exercises    Other Exercises   supine butterfly stretch with only one leg stretching at a time (other leg remains in hooklying); pt providing overpressure for LLE 2 reps each leg x30 sec; supine marching x 15 reps.  Seated ankle stretching with left forefoot propped on base of quad cane (pt reports she owns one at home). repeated x3 for 30 seconds             PT Education - 11/02/18 1552    Education Details  reviewed HEP; added left heel cord stretch; educated on possible shoe modifications for left shoe (to resist ankle rolling out) and ?  brace for rt knee (to prevent further valgus deformity); educated on need for MD referral for either and pt OK with pursuing; educated on her choice of orthotists and she has no preference. Agreed PT would check to see if either company can allow [place] to trial a knee brace prior to determining if it is appropriate and someting she could manage.    Person(s) Educated  Patient    Methods  Explanation;Demonstration    Comprehension  Verbalized understanding       PT Short Term Goals - 10/16/18 1133      PT SHORT TERM GOAL #1   Title  patient to be independent with initial HEP for strength and balance    Time  4    Period  Weeks    Status  New    Target Date  11/20/18      PT SHORT TERM GOAL #2   Title  patient to improve Berg by >/= 4 points     Time   4    Period  Weeks    Status  New    Target Date  11/20/18      PT SHORT TERM GOAL #3   Title  patient to improve gait speed by >/= 0.8 ft/sec demonstrating improved functional mobility    Time  4    Period  Weeks    Status  New    Target Date  11/20/18      PT SHORT TERM GOAL #4   Title  patient to demonstrate reduced TUG score by >/= 6 seconds     Time  4    Period  Weeks    Status  New    Target Date  11/20/18        PT Long Term Goals - 10/16/18 1134      PT LONG TERM GOAL #1   Title  patient to be independent with advanced HEP    Time  8    Period  Weeks    Status  New    Target Date  12/25/18      PT LONG TERM GOAL #2   Title  patient to demonstrate improved BERG  by >/= 8 points from baseline     Time  8    Period  Weeks    Status  New    Target Date  12/25/18      PT LONG TERM GOAL #3   Title  patient to improve 5x STS by >/= 8 seconds     Time  8    Status  New    Target Date  12/25/18      PT LONG TERM GOAL #4   Title  patient to demonstrate gait with/without bracing with LRAD over various levels and surfaces at Mod I without knee buckle or instability demosntrating reduced fall risk    Time  8    Period  Weeks    Status  New    Target Date  12/25/18      PT LONG TERM GOAL #5   Title  patient to verbalize fall prevention strategies and report no falls in >/= 4 weeks    Time  8    Period  Weeks    Status  New    Target Date  12/25/18            Plan - 11/02/18 1611    Clinical Impression Statement  Session focused on review of HEP (pt had lost her  copy when she left here and had not practiced) and then assessing potential equipment needed to stablize Lt ankle inversion and rt knee valgus. Patient reports she is open to possible left shoe modifications and rt knee hinged brace. She is willing to meet with an orthotist to discuss possible solutions. She was explained her choice between orthotist offices and she had no prior knowledge of either  and agreed with plan for whomever could come to PT session first.     Rehab Potential  Good    PT Frequency  2x / week    PT Duration  8 weeks    PT Treatment/Interventions  ADLs/Self Care Home Management;Aquatic Therapy;Biofeedback;Electrical Stimulation;Therapeutic exercise;Therapeutic activities;Functional mobility training;Stair training;Gait training;DME Instruction;Balance training;Neuromuscular re-education;Patient/family education;Orthotic Fit/Training;Prosthetic Training;Manual techniques;Taping;Splinting;Passive range of motion    PT Next Visit Plan  check ankle stretching added to HEP 12/9 (seated left forefoot on side of SBQC); notify pt when orthotist can attend a PT session Jeani Hawking to follow-up). Work on Chemical engineer and rt hip strength    Consulted and Agree with Plan of Care  Patient       Patient will benefit from skilled therapeutic intervention in order to improve the following deficits and impairments:  Abnormal gait, Decreased balance, Decreased range of motion, Decreased mobility, Difficulty walking, Decreased strength, Decreased endurance, Impaired tone  Visit Diagnosis: Unsteadiness on feet  Other abnormalities of gait and mobility  Muscle weakness (generalized)  History of falling     Problem List Patient Active Problem List   Diagnosis Date Noted  . Gait abnormality 10/14/2018  . Abdominal pain 06/16/2018  . 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 Hypertension   . Depression   . High cholesterol   . MS (multiple sclerosis) ([place])   . Hypercholesteremia   . Multiple sclerosis ([place]) 03/22/2013    Rexanne Mano, PT 11/02/2018, 4:27 PM  [facility] [address] [place], [place], [address] Phone: [phone]   Fax:  [phone]  Name: Kameron Glazebrook Kriegel MRN: [id number] Date of Birth: 1956/03/15

## 2018-11-04 ENCOUNTER — Ambulatory Visit
Admission: RE | Admit: 2018-11-04 | Discharge: 2018-11-04 | Disposition: A | Discharge status: Home / Self Care | Payer: Medicare Other | Source: Ambulatory Visit | Attending: Neurology | Admitting: Neurology

## 2018-11-04 DIAGNOSIS — G35 Multiple sclerosis: Secondary | ICD-10-CM

## 2018-11-04 MED ORDER — GADOBENATE DIMEGLUMINE 529 MG/ML IV SOLN
12.0000 mL | Freq: Once | INTRAVENOUS | Status: AC | PRN
  Administered 2018-11-04: 12 mL via INTRAVENOUS

## 2018-11-06 ENCOUNTER — Ambulatory Visit: Payer: Medicare Other | Admitting: Rehabilitative and Restorative Service Providers"

## 2018-11-09 ENCOUNTER — Telehealth: Payer: Self-pay | Admitting: Neurology

## 2018-11-09 ENCOUNTER — Ambulatory Visit: Payer: Medicare Other | Admitting: Rehabilitative and Restorative Service Providers"

## 2018-11-09 DIAGNOSIS — F329 Major depressive disorder, single episode, unspecified: Secondary | ICD-10-CM

## 2018-11-09 DIAGNOSIS — F32A Depression, unspecified: Secondary | ICD-10-CM

## 2018-11-09 NOTE — Telephone Encounter (Signed)
Please call patient, MRI of brain and cervical spine showed no significant change compared to previous scan in June 2015,  If her worsening gait abnormality continued, we may consider other possibilities such as lumbar degenerative changes, which usually presented with significant low back pain, radiating pain to bilateral lower extremity, worsening bilateral lower extremity paresthesia,  If she continues to have worsening gait abnormality from her baseline, we may extend our imaging study to MRI of lumbar spine,  I can refer her to home PT if she want IMPRESSION:   MRI brain (with and without) demonstrating: - Moderate periventricular and subcortical foci of chronic demyelinating disease. - No abnormal lesions are seen on post contrast views.  - Stable compared to MRI on 05/16/14.   MRI cervical spine (with and without) demonstrating; - Multiple chronic demyelinating plaques from C3 to C7-T1 levels. - C3-4: disc bulging with mild spinal stenosis and severe biforaminal stenosis.  - C4-5: uncovertebral joint hypertrophy and facet hypertrophy with severe biforaminal stenosis.  - No significant change from MRI on 05/16/14.

## 2018-11-09 NOTE — Telephone Encounter (Signed)
Spoke to patient and notified her of the MRI results.  She verbalized understanding.  She denies having any pain in her low back or legs.  She was in PT but canceled her appointments.  States she also never followed through on the home exercises they instructed her to complete.  She feels part of her issue is depression, despite being on both Wellbutrin and Prozac.  She would like a referral to speak to a psychiatrist to see if she can get this under better control. Additionally, she plans to reschedule her PT and work hard to see if it will help her gait.

## 2018-11-10 NOTE — Addendum Note (Signed)
Addended by: Marcial Pacas on: 11/10/2018 01:40 PM   Modules accepted: Orders

## 2018-11-10 NOTE — Addendum Note (Signed)
Addended by: Marcial Pacas on: 11/10/2018 08:06 AM   Modules accepted: Orders

## 2018-11-10 NOTE — Telephone Encounter (Signed)
Returned call to patient [phone]) - she is aware Dr. Krista Blue has placed the referral to psychiatry.

## 2018-11-13 ENCOUNTER — Ambulatory Visit: Payer: Medicare Other | Admitting: Physical Therapy

## 2018-11-16 ENCOUNTER — Ambulatory Visit: Payer: Medicare Other | Admitting: Physical Therapy

## 2018-11-20 ENCOUNTER — Ambulatory Visit: Payer: Medicare Other | Admitting: Rehabilitative and Restorative Service Providers"

## 2018-11-23 ENCOUNTER — Ambulatory Visit: Payer: Medicare Other | Admitting: Rehabilitation

## 2018-11-27 ENCOUNTER — Ambulatory Visit: Payer: Medicare Other | Admitting: Physical Therapy

## 2018-11-30 ENCOUNTER — Ambulatory Visit: Payer: Medicare Other | Admitting: Physical Therapy

## 2018-12-04 ENCOUNTER — Ambulatory Visit: Payer: Medicare Other | Admitting: Physical Therapy

## 2018-12-07 ENCOUNTER — Ambulatory Visit: Payer: Medicare Other | Admitting: Physical Therapy

## 2018-12-10 ENCOUNTER — Ambulatory Visit (HOSPITAL_COMMUNITY): Payer: Self-pay | Admitting: Psychiatry

## 2018-12-11 ENCOUNTER — Ambulatory Visit: Payer: Medicare Other | Admitting: Physical Therapy

## 2018-12-14 ENCOUNTER — Ambulatory Visit: Payer: Medicare Other | Admitting: Physical Therapy

## 2018-12-18 ENCOUNTER — Ambulatory Visit: Payer: Medicare Other | Admitting: Physical Therapy

## 2019-01-11 ENCOUNTER — Telehealth: Payer: Self-pay | Admitting: Neurology

## 2019-01-11 NOTE — Telephone Encounter (Signed)
Returned call to patient.  Says she is now under the care of Dr. Holwerda for depression.  She is going to contact him to discuss refills of her anti-depressant medications.   

## 2019-01-11 NOTE — Telephone Encounter (Signed)
Pt is needing her buPROPion (WELLBUTRIN SR) 150 MG 12 hr tablet sent to [facility]

## 2019-02-07 ENCOUNTER — Telehealth: Payer: Self-pay | Admitting: *Deleted

## 2019-02-07 NOTE — Telephone Encounter (Signed)
Left message letting patient know our office will be closed, on 02/08/2019, to disinfect the facility.  We will call back to reschedule the appointment.  

## 2019-02-08 ENCOUNTER — Ambulatory Visit: Payer: Medicare Other | Admitting: Neurology

## 2019-02-08 ENCOUNTER — Telehealth: Payer: Self-pay | Admitting: Neurology

## 2019-02-08 NOTE — Telephone Encounter (Signed)
I have called patient, MRI of cervical spine in December 2019 showed multiple chronic demyelinating plaque from C3-C7 T1 levels, no significant change compared to MRI in June 2015,  MRI of the brain without contrast also showed moderate periventricular subcortical chronic demyelinating disease no acute lesions, stable compared to previous scan in June 2015,  She still having significant gait abnormality which is at her baseline, no significant change,  We will cancel her appointment on May 11, 2019, she will call clinic for new issues

## 2019-02-22 ENCOUNTER — Other Ambulatory Visit: Payer: Self-pay | Admitting: Neurology

## 2019-05-26 ENCOUNTER — Telehealth: Payer: Self-pay | Admitting: Neurology

## 2019-05-26 NOTE — Telephone Encounter (Signed)
Returned call to patient.  Says she is now under the care of Dr. Ardeth Perfect for depression.  She is going to contact him to discuss refills of her anti-depressant medications.

## 2019-05-26 NOTE — Telephone Encounter (Signed)
Pt is requesting a refill of FLUoxetine (PROZAC) 40 MG capsule , to be sent to [facility], [place]

## 2019-06-01 ENCOUNTER — Other Ambulatory Visit: Payer: Self-pay | Admitting: Neurology

## 2019-06-23 ENCOUNTER — Telehealth: Payer: Self-pay | Admitting: *Deleted

## 2019-06-23 NOTE — Telephone Encounter (Signed)
Lt voicemail unable to reach pt. 

## 2019-06-28 ENCOUNTER — Telehealth: Payer: Self-pay | Admitting: Neurology

## 2019-06-28 DIAGNOSIS — Z0289 Encounter for other administrative examinations: Secondary | ICD-10-CM

## 2019-06-28 NOTE — Telephone Encounter (Signed)
error 

## 2019-07-30 ENCOUNTER — Telehealth: Payer: Self-pay | Admitting: Neurology

## 2019-07-30 NOTE — Telephone Encounter (Signed)
Pt states she is having pain in the R leg and it is causing her to fall. Pt would like the RN to call her to discuss this. Please advise.

## 2019-07-30 NOTE — Telephone Encounter (Signed)
I called the patient and she has been scheduled to see Dr. Krista Blue on 08/04/2019.

## 2019-08-04 ENCOUNTER — Encounter: Payer: Self-pay | Admitting: Neurology

## 2019-08-04 ENCOUNTER — Ambulatory Visit: Payer: Medicare Other | Admitting: Neurology

## 2019-08-04 ENCOUNTER — Other Ambulatory Visit: Payer: Self-pay

## 2019-08-04 VITALS — BP 123/69 | HR 80 | Temp 98.6°F

## 2019-08-04 DIAGNOSIS — G35 Multiple sclerosis: Secondary | ICD-10-CM | POA: Diagnosis not present

## 2019-08-04 DIAGNOSIS — G8220 Paraplegia, unspecified: Secondary | ICD-10-CM

## 2019-08-04 DIAGNOSIS — R269 Unspecified abnormalities of gait and mobility: Secondary | ICD-10-CM | POA: Diagnosis not present
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
## 2019-08-12 ENCOUNTER — Telehealth: Payer: Self-pay | Admitting: Neurology

## 2019-08-12 MED ORDER — TIZANIDINE HCL 4 MG PO TABS: 4.0000 mg | ORAL_TABLET | Freq: Every day | ORAL | 3 refills | Status: DC

## 2019-08-12 MED ORDER — BACLOFEN 10 MG PO TABS: 10.0000 mg | ORAL_TABLET | Freq: Three times a day (TID) | ORAL | 3 refills | Status: DC

## 2019-08-12 NOTE — Addendum Note (Signed)
Addended by: Noberto Retort C on: 08/12/2019 05:13 PM   Modules accepted: Orders

## 2019-08-12 NOTE — Telephone Encounter (Signed)
Per vo by Dr. Krista Blue, it is okay to provide patient with new prescriptions for the following:  1) tizanidine 4mg , one tablet at bedtime. 2) baclofen 10mg , one tablet TID.  The patient is agreeable and would like these sent to [facility].  She will make the changes with her home supply for now and verbalized understanding of the new instructions.

## 2019-08-12 NOTE — Telephone Encounter (Signed)
Pt states the tiZANidine (ZANAFLEX) 4 MG tablet is too sedating and pt wants to try something else, please call

## 2019-08-12 NOTE — Telephone Encounter (Signed)
She attempted to increase her tizanidine 4mg  to one tablet TID, as recently discussed.  States the higher dose caused excessive drowsiness and she is unable to tolerate it (previously she was just taking one tab QHS).    She also take baclofen 10mg , one tablet at bedtime.    She is wondering if she can go back to taking tizanidine 4mg , one tablet at bedtime and increase the baclofen 10mg  to one tablet TID instead.

## 2019-09-08 ENCOUNTER — Telehealth: Payer: Self-pay | Admitting: Neurology

## 2019-09-08 DIAGNOSIS — G35 Multiple sclerosis: Secondary | ICD-10-CM

## 2019-09-08 DIAGNOSIS — G8220 Paraplegia, unspecified: Secondary | ICD-10-CM

## 2019-09-08 NOTE — Telephone Encounter (Signed)
Pt called in and stated her legs are swelling and she is having difficulty walking

## 2019-09-08 NOTE — Telephone Encounter (Signed)
I spoke to the patient and she would like to be seen by Dr. Krista Blue.  She is having increased spasticity and swelling in her legs (left side worse than right).  Dr. Krista Blue had availability for tomorrow, 09/09/2019.  The patient will arrive for check-in at 12:45pm.    Also, she informed me that she has not been contact by home health for PT yet.  She understands we will check on this for her.

## 2019-09-09 ENCOUNTER — Other Ambulatory Visit: Payer: Self-pay

## 2019-09-09 ENCOUNTER — Ambulatory Visit: Payer: Medicare Other | Admitting: Neurology

## 2019-09-09 ENCOUNTER — Encounter: Payer: Self-pay | Admitting: Neurology

## 2019-09-09 VITALS — BP 140/78 | HR 85 | Temp 98.0°F

## 2019-09-09 DIAGNOSIS — M7989 Other specified soft tissue disorders: Secondary | ICD-10-CM | POA: Insufficient documentation

## 2019-09-09 DIAGNOSIS — G35 Multiple sclerosis: Secondary | ICD-10-CM

## 2019-09-09 DIAGNOSIS — G8220 Paraplegia, unspecified: Secondary | ICD-10-CM
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
## 2019-09-20 NOTE — Telephone Encounter (Signed)
Pt has called to inform RN that she has not been contacted by physical therapy yet

## 2019-09-21 NOTE — Telephone Encounter (Signed)
Sharyn Lull order is fine I was able to use. I spoke to patient and [facility] was able to pick her up for Home PT . I spoke with Clinical Mgr. Melissa . I will get patient scheduled for echo. Thanks [place].

## 2019-09-21 NOTE — Telephone Encounter (Signed)
I have reached out to [facility] to see if Mary Villanueva can take Patient . I will call patient to let her now update.

## 2019-09-21 NOTE — Telephone Encounter (Signed)
Called and spoke to patient about home health PT . Patient told me about Echo Sharyn Lull will you re-order [id number] - Echo was approved buy her insurance reference number [id number]. Thanks [place].

## 2019-09-27 ENCOUNTER — Ambulatory Visit (HOSPITAL_COMMUNITY)
Admission: RE | Admit: 2019-09-27 | Discharge: 2019-09-27 | Disposition: A | Discharge status: Home / Self Care | Payer: Medicare Other | Source: Ambulatory Visit | Attending: Neurology | Admitting: Neurology

## 2019-09-27 ENCOUNTER — Other Ambulatory Visit: Payer: Self-pay

## 2019-09-27 DIAGNOSIS — I081 Rheumatic disorders of both mitral and tricuspid valves: Secondary | ICD-10-CM | POA: Insufficient documentation

## 2019-09-27 DIAGNOSIS — M7989 Other specified soft tissue disorders: Secondary | ICD-10-CM | POA: Diagnosis not present

## 2019-09-27 DIAGNOSIS — G35 Multiple sclerosis: Secondary | ICD-10-CM | POA: Diagnosis not present

## 2019-09-27 DIAGNOSIS — I509 Heart failure, unspecified: Secondary | ICD-10-CM | POA: Insufficient documentation

## 2019-09-27 DIAGNOSIS — G8220 Paraplegia, unspecified: Secondary | ICD-10-CM | POA: Insufficient documentation

## 2019-09-27 NOTE — Progress Notes (Signed)
  Echocardiogram 2D Echocardiogram has been performed.  Mary Villanueva 09/27/2019, 1:50 PM

## 2019-10-25 ENCOUNTER — Telehealth: Payer: Self-pay | Admitting: Neurology

## 2019-11-04 ENCOUNTER — Telehealth: Payer: Self-pay | Admitting: Neurology

## 2019-11-04 NOTE — Telephone Encounter (Signed)
Gracie from [facility] is calling in requesting Verbal Orders for PT and Social Work Eval  PT- 1 week 1. 2 week 4, and 1 week Dewy Rose and Eval - 1 visit  CB# [phone]

## 2019-11-04 NOTE — Telephone Encounter (Signed)
I returned the call to Gracie at [facility] to provide requested verbal orders.

## 2019-12-02 ENCOUNTER — Telehealth: Payer: Self-pay | Admitting: Neurology

## 2019-12-02 NOTE — Telephone Encounter (Signed)
I spoke to [place], PT w/ [facility].  Reports patient has left foot drop and she would like an order for brace sent to [facility] (ph: [phone])  Dr. Krista Blue is agreeable and signed MD order has been faxed to [facility] (fax: [phone]).

## 2019-12-02 NOTE — Telephone Encounter (Signed)
Patient is calling in requesting a brace for her left foot to be ordered

## 2019-12-15 ENCOUNTER — Telehealth: Payer: Self-pay | Admitting: Neurology

## 2019-12-15 DIAGNOSIS — G8220 Paraplegia, unspecified: Secondary | ICD-10-CM

## 2019-12-15 DIAGNOSIS — G35 Multiple sclerosis: Secondary | ICD-10-CM

## 2019-12-15 DIAGNOSIS — R269 Unspecified abnormalities of gait and mobility: Secondary | ICD-10-CM

## 2019-12-15 NOTE — Telephone Encounter (Signed)
Gracee Q. From [facility] called stating that the pt has been having a lot more frequent falls due to her legs giving out on her. Gracee states that there are no injuries and PT is being continued. Gracee would also like to request a prescription for a standard wheelchair for the pt and it can be faxed to [phone].

## 2019-12-15 NOTE — Telephone Encounter (Signed)
Per vo by Dr. Felecia Shelling, he will authorize rx for standard wheelchair.  Prescription has been printed, signed and faxed back to the number below.  I also returned the call to [place] at [facility] and left a message with this information on her voicemail.

## 2019-12-23 ENCOUNTER — Telehealth: Payer: Self-pay | Admitting: Neurology

## 2019-12-23 NOTE — Telephone Encounter (Signed)
Left message requesting a return call.

## 2019-12-23 NOTE — Telephone Encounter (Signed)
Pt called wanting to know if she should make an appointment to see the provider about her falls or what can be suggested. Please advise.

## 2019-12-23 NOTE — Telephone Encounter (Addendum)
I left a third message for her. Requested a call back.  When the patient calls back, please offer her an appt with Dr. Krista Blue next week. She can come in on Mon, Tues or Thurs in the 11:30am slot. She will need to arrive early for check-in. I can override the schedule, once she decides on the day.

## 2019-12-23 NOTE — Telephone Encounter (Signed)
I attempted the patient a second time. Left another message. Provided our number to call back. We are happy to see her in the office.  When the patient calls back, please offer her an appt with Dr. Krista Blue next week. She can come in on Mon, Tues or Thurs in the 11:30am slot. She will need to arrive early for check-in. I can override the schedule, once she decides on the day.

## 2020-02-01 ENCOUNTER — Telehealth: Payer: Self-pay | Admitting: Neurology

## 2020-02-01 ENCOUNTER — Encounter: Payer: Self-pay | Admitting: *Deleted

## 2020-02-01 NOTE — Telephone Encounter (Signed)
Pt is asking for a letter to be prepared to the [employer] re: her disability.  Pt states that for where she lives all the mailboxes are grouped together at a central location for the housing development.  Pt states due to her disability she would like a letter prepared to justify her mail being brought directly to her home due to her disability.

## 2020-02-01 NOTE — Telephone Encounter (Signed)
Letter written, signed and placed up front for pick up.  I called and left the patient a message letting her know it was ready. Provided our number to call back, if she has any questions.

## 2020-04-05 ENCOUNTER — Other Ambulatory Visit (HOSPITAL_COMMUNITY): Payer: Self-pay | Admitting: Internal Medicine

## 2020-04-05 DIAGNOSIS — I739 Peripheral vascular disease, unspecified: Secondary | ICD-10-CM

## 2020-04-05 DIAGNOSIS — R609 Edema, unspecified: Secondary | ICD-10-CM

## 2020-04-06 ENCOUNTER — Other Ambulatory Visit: Payer: Self-pay

## 2020-04-06 ENCOUNTER — Ambulatory Visit (HOSPITAL_COMMUNITY)
Admission: RE | Admit: 2020-04-06 | Discharge: 2020-04-06 | Disposition: A | Discharge status: Home / Self Care | Payer: Medicare Other | Source: Ambulatory Visit | Attending: Vascular Surgery | Admitting: Vascular Surgery

## 2020-04-06 ENCOUNTER — Ambulatory Visit (INDEPENDENT_AMBULATORY_CARE_PROVIDER_SITE_OTHER)
Admission: RE | Admit: 2020-04-06 | Discharge: 2020-04-06 | Disposition: A | Discharge status: Home / Self Care | Payer: Medicare Other | Source: Ambulatory Visit | Attending: Vascular Surgery | Admitting: Vascular Surgery

## 2020-04-06 DIAGNOSIS — R609 Edema, unspecified: Secondary | ICD-10-CM

## 2020-04-06 DIAGNOSIS — I739 Peripheral vascular disease, unspecified: Secondary | ICD-10-CM | POA: Insufficient documentation

## 2020-05-31 NOTE — Telephone Encounter (Signed)
Error

## 2020-07-10 ENCOUNTER — Encounter: Payer: Self-pay | Admitting: Neurology

## 2020-07-17 ENCOUNTER — Ambulatory Visit: Payer: Medicare Other | Admitting: Neurology

## 2020-08-01 ENCOUNTER — Emergency Department (HOSPITAL_COMMUNITY): Payer: Medicare Other

## 2020-08-01 ENCOUNTER — Inpatient Hospital Stay (HOSPITAL_COMMUNITY)
Admission: EM | Admit: 2020-08-01 | Discharge: 2020-08-07 | DRG: 059 | Disposition: A | Discharge status: Rehabilitation Facility | Payer: Medicare Other | Attending: Internal Medicine | Admitting: Internal Medicine

## 2020-08-01 ENCOUNTER — Telehealth: Payer: Self-pay | Admitting: Neurology

## 2020-08-01 ENCOUNTER — Other Ambulatory Visit: Payer: Self-pay

## 2020-08-01 DIAGNOSIS — N3281 Overactive bladder: Secondary | ICD-10-CM | POA: Diagnosis present

## 2020-08-01 DIAGNOSIS — N39498 Other specified urinary incontinence: Secondary | ICD-10-CM | POA: Diagnosis present

## 2020-08-01 DIAGNOSIS — I10 Essential (primary) hypertension: Secondary | ICD-10-CM | POA: Diagnosis present

## 2020-08-01 DIAGNOSIS — N179 Acute kidney failure, unspecified: Secondary | ICD-10-CM | POA: Diagnosis present

## 2020-08-01 DIAGNOSIS — E785 Hyperlipidemia, unspecified: Secondary | ICD-10-CM | POA: Diagnosis present

## 2020-08-01 DIAGNOSIS — F419 Anxiety disorder, unspecified: Secondary | ICD-10-CM | POA: Diagnosis present

## 2020-08-01 DIAGNOSIS — T380X5A Adverse effect of glucocorticoids and synthetic analogues, initial encounter: Secondary | ICD-10-CM | POA: Diagnosis not present

## 2020-08-01 DIAGNOSIS — Z79890 Hormone replacement therapy (postmenopausal): Secondary | ICD-10-CM

## 2020-08-01 DIAGNOSIS — G35D Multiple sclerosis, unspecified: Secondary | ICD-10-CM | POA: Diagnosis present

## 2020-08-01 DIAGNOSIS — F329 Major depressive disorder, single episode, unspecified: Secondary | ICD-10-CM | POA: Diagnosis present

## 2020-08-01 DIAGNOSIS — E039 Hypothyroidism, unspecified: Secondary | ICD-10-CM | POA: Diagnosis present

## 2020-08-01 DIAGNOSIS — Z833 Family history of diabetes mellitus: Secondary | ICD-10-CM | POA: Diagnosis not present

## 2020-08-01 DIAGNOSIS — M62838 Other muscle spasm: Secondary | ICD-10-CM | POA: Diagnosis not present

## 2020-08-01 DIAGNOSIS — Z87891 Personal history of nicotine dependence: Secondary | ICD-10-CM

## 2020-08-01 DIAGNOSIS — E7800 Pure hypercholesterolemia, unspecified: Secondary | ICD-10-CM | POA: Diagnosis not present

## 2020-08-01 DIAGNOSIS — Z20822 Contact with and (suspected) exposure to covid-19: Secondary | ICD-10-CM | POA: Diagnosis present

## 2020-08-01 DIAGNOSIS — Z8249 Family history of ischemic heart disease and other diseases of the circulatory system: Secondary | ICD-10-CM | POA: Diagnosis not present

## 2020-08-01 DIAGNOSIS — R739 Hyperglycemia, unspecified: Secondary | ICD-10-CM | POA: Diagnosis not present

## 2020-08-01 DIAGNOSIS — G35 Multiple sclerosis: Secondary | ICD-10-CM | POA: Diagnosis present

## 2020-08-01 DIAGNOSIS — W19XXXA Unspecified fall, initial encounter: Secondary | ICD-10-CM | POA: Diagnosis present

## 2020-08-01 DIAGNOSIS — Z79899 Other long term (current) drug therapy: Secondary | ICD-10-CM | POA: Diagnosis not present

## 2020-08-01 LAB — SARS CORONAVIRUS 2 BY RT PCR (HOSPITAL ORDER, PERFORMED IN ~~LOC~~ HOSPITAL LAB): SARS Coronavirus 2: NEGATIVE

## 2020-08-01 LAB — CBC
HCT: 34.2 % — ABNORMAL LOW (ref 36.0–46.0)
Hemoglobin: 11.1 g/dL — ABNORMAL LOW (ref 12.0–15.0)
MCH: 31.4 pg (ref 26.0–34.0)
MCHC: 32.5 g/dL (ref 30.0–36.0)
MCV: 96.6 fL (ref 80.0–100.0)
Platelets: 201 10*3/uL (ref 150–400)
RBC: 3.54 MIL/uL — ABNORMAL LOW (ref 3.87–5.11)
RDW: 12.7 % (ref 11.5–15.5)
WBC: 5 10*3/uL (ref 4.0–10.5)
nRBC: 0 % (ref 0.0–0.2)

## 2020-08-01 LAB — BASIC METABOLIC PANEL
Anion gap: 8 (ref 5–15)
BUN: 23 mg/dL (ref 8–23)
CO2: 26 mmol/L (ref 22–32)
Calcium: 9 mg/dL (ref 8.9–10.3)
Chloride: 107 mmol/L (ref 98–111)
Creatinine, Ser: 1.2 mg/dL — ABNORMAL HIGH (ref 0.44–1.00)
GFR calc Af Amer: 55 mL/min — ABNORMAL LOW (ref >60)
GFR calc non Af Amer: 48 mL/min — ABNORMAL LOW (ref >60)
Glucose, Bld: 87 mg/dL (ref 70–99)
Potassium: 3.9 mmol/L (ref 3.5–5.1)
Sodium: 141 mmol/L (ref 135–145)

## 2020-08-01 LAB — FOLATE: Folate: 36.4 ng/mL (ref >5.9)

## 2020-08-01 LAB — CBC WITH DIFFERENTIAL/PLATELET
Abs Immature Granulocytes: 0.01 10*3/uL (ref 0.00–0.07)
Basophils Absolute: 0 10*3/uL (ref 0.0–0.1)
Basophils Relative: 1 %
Eosinophils Absolute: 0.1 10*3/uL (ref 0.0–0.5)
Eosinophils Relative: 1 %
HCT: 34.7 % — ABNORMAL LOW (ref 36.0–46.0)
Hemoglobin: 11.2 g/dL — ABNORMAL LOW (ref 12.0–15.0)
Immature Granulocytes: 0 %
Lymphocytes Relative: 18 %
Lymphs Abs: 1 10*3/uL (ref 0.7–4.0)
MCH: 31.2 pg (ref 26.0–34.0)
MCHC: 32.3 g/dL (ref 30.0–36.0)
MCV: 96.7 fL (ref 80.0–100.0)
Monocytes Absolute: 0.5 10*3/uL (ref 0.1–1.0)
Monocytes Relative: 9 %
Neutro Abs: 4 10*3/uL (ref 1.7–7.7)
Neutrophils Relative %: 71 %
Platelets: 215 10*3/uL (ref 150–400)
RBC: 3.59 MIL/uL — ABNORMAL LOW (ref 3.87–5.11)
RDW: 12.7 % (ref 11.5–15.5)
WBC: 5.6 10*3/uL (ref 4.0–10.5)
nRBC: 0 % (ref 0.0–0.2)

## 2020-08-01 LAB — URINALYSIS, ROUTINE W REFLEX MICROSCOPIC
Bilirubin Urine: NEGATIVE
Glucose, UA: NEGATIVE mg/dL
Hgb urine dipstick: NEGATIVE
Ketones, ur: NEGATIVE mg/dL
Leukocytes,Ua: NEGATIVE
Nitrite: NEGATIVE
Protein, ur: NEGATIVE mg/dL
Specific Gravity, Urine: 1.014 (ref 1.005–1.030)
pH: 5 (ref 5.0–8.0)

## 2020-08-01 LAB — VITAMIN B12: Vitamin B-12: 694 pg/mL (ref 180–914)

## 2020-08-01 LAB — CREATININE, SERUM
Creatinine, Ser: 0.99 mg/dL (ref 0.44–1.00)
GFR calc Af Amer: >60 mL/min (ref >60)
GFR calc non Af Amer: >60 mL/min (ref >60)

## 2020-08-01 LAB — PHOSPHORUS: Phosphorus: 3.1 mg/dL (ref 2.5–4.6)

## 2020-08-01 LAB — TSH: TSH: 1.461 u[IU]/mL (ref 0.350–4.500)

## 2020-08-01 LAB — HIV ANTIBODY (ROUTINE TESTING W REFLEX): HIV Screen 4th Generation wRfx: NONREACTIVE

## 2020-08-01 LAB — MAGNESIUM: Magnesium: 1.9 mg/dL (ref 1.7–2.4)

## 2020-08-01 MED ORDER — GADOBUTROL 1 MMOL/ML IV SOLN
6.0000 mL | Freq: Once | INTRAVENOUS | Status: AC | PRN
  Administered 2020-08-01: 6 mL via INTRAVENOUS

## 2020-08-01 MED ORDER — BACLOFEN 10 MG PO TABS: 10.0000 mg | ORAL_TABLET | Freq: Three times a day (TID) | ORAL | Status: DC

## 2020-08-01 MED ORDER — FLUOXETINE HCL 20 MG PO CAPS
40.0000 mg | ORAL_CAPSULE | Freq: Every day | ORAL | Status: DC
  Administered 2020-08-02 – 2020-08-07 (×6): 40 mg via ORAL
  Filled 2020-08-01 (×7): qty 2

## 2020-08-01 MED ORDER — OXYCODONE HCL 5 MG PO TABS: 5.0000 mg | ORAL_TABLET | ORAL | Status: DC | PRN

## 2020-08-01 MED ORDER — ACETAMINOPHEN 325 MG PO TABS
650.0000 mg | ORAL_TABLET | Freq: Four times a day (QID) | ORAL | Status: DC | PRN
  Administered 2020-08-01 – 2020-08-07 (×4): 650 mg via ORAL
  Filled 2020-08-01 (×4): qty 2

## 2020-08-01 MED ORDER — METHOCARBAMOL 500 MG PO TABS
500.0000 mg | ORAL_TABLET | Freq: Three times a day (TID) | ORAL | Status: DC
  Administered 2020-08-01 – 2020-08-03 (×5): 500 mg via ORAL
  Filled 2020-08-01 (×5): qty 1

## 2020-08-01 MED ORDER — ENOXAPARIN SODIUM 40 MG/0.4ML ~~LOC~~ SOLN
40.0000 mg | SUBCUTANEOUS | Status: DC
  Administered 2020-08-01 – 2020-08-06 (×6): 40 mg via SUBCUTANEOUS
  Filled 2020-08-01 (×6): qty 0.4

## 2020-08-01 MED ORDER — SIMVASTATIN 20 MG PO TABS
20.0000 mg | ORAL_TABLET | Freq: Every evening | ORAL | Status: DC
  Administered 2020-08-01 – 2020-08-06 (×6): 20 mg via ORAL
  Filled 2020-08-01 (×6): qty 1

## 2020-08-01 MED ORDER — OXYBUTYNIN CHLORIDE ER 5 MG PO TB24
10.0000 mg | ORAL_TABLET | Freq: Every day | ORAL | Status: DC
  Administered 2020-08-02 – 2020-08-06 (×6): 10 mg via ORAL
  Filled 2020-08-01 (×4): qty 2
  Filled 2020-08-01: qty 1
  Filled 2020-08-01 (×2): qty 2

## 2020-08-01 MED ORDER — PANTOPRAZOLE SODIUM 40 MG IV SOLR
40.0000 mg | INTRAVENOUS | Status: DC
  Administered 2020-08-01: 40 mg via INTRAVENOUS
  Filled 2020-08-01: qty 40

## 2020-08-01 MED ORDER — BUPROPION HCL ER (SR) 150 MG PO TB12
150.0000 mg | ORAL_TABLET | Freq: Every day | ORAL | Status: DC
  Administered 2020-08-02 – 2020-08-07 (×6): 150 mg via ORAL
  Filled 2020-08-01 (×6): qty 1

## 2020-08-01 MED ORDER — TIZANIDINE HCL 4 MG PO TABS
4.0000 mg | ORAL_TABLET | Freq: Every day | ORAL | Status: DC
  Administered 2020-08-01 – 2020-08-06 (×6): 4 mg via ORAL
  Filled 2020-08-01 (×6): qty 1

## 2020-08-01 MED ORDER — ACETAMINOPHEN 650 MG RE SUPP: 650.0000 mg | Freq: Four times a day (QID) | RECTAL | Status: DC | PRN

## 2020-08-01 MED ORDER — HYDRALAZINE HCL 20 MG/ML IJ SOLN: 10.0000 mg | Freq: Four times a day (QID) | INTRAMUSCULAR | Status: DC | PRN

## 2020-08-01 MED ORDER — ONDANSETRON HCL 4 MG/2ML IJ SOLN: 4.0000 mg | Freq: Four times a day (QID) | INTRAMUSCULAR | Status: DC | PRN

## 2020-08-01 MED ORDER — ONDANSETRON HCL 4 MG PO TABS: 4.0000 mg | ORAL_TABLET | Freq: Four times a day (QID) | ORAL | Status: DC | PRN

## 2020-08-01 MED ORDER — SODIUM CHLORIDE 0.9 % IV SOLN
1000.0000 mg | Freq: Every day | INTRAVENOUS | Status: AC
  Administered 2020-08-01 – 2020-08-03 (×3): 1000 mg via INTRAVENOUS
  Filled 2020-08-01 (×3): qty 8

## 2020-08-01 MED ORDER — VITAMIN B-12 1000 MCG PO TABS
500.0000 ug | ORAL_TABLET | Freq: Every day | ORAL | Status: DC
  Administered 2020-08-01 – 2020-08-07 (×7): 500 ug via ORAL
  Filled 2020-08-01 (×7): qty 1

## 2020-08-01 MED ORDER — LEVOTHYROXINE SODIUM 25 MCG PO TABS
25.0000 ug | ORAL_TABLET | Freq: Every day | ORAL | Status: DC
  Administered 2020-08-02 – 2020-08-07 (×6): 25 ug via ORAL
  Filled 2020-08-01 (×6): qty 1

## 2020-08-01 NOTE — ED Notes (Signed)
Pt transported to MRI 

## 2020-08-01 NOTE — Telephone Encounter (Signed)
Pt returned phone call. Pt is at [facility] getting a MRI is not able to talk.

## 2020-08-01 NOTE — Telephone Encounter (Signed)
Pt ask if nurse could give her a call. Pt said she is in pain and needs an earlier appt.

## 2020-08-01 NOTE — H&P (Signed)
History and Physical    Mary Villanueva DOB: August 22, 1956 DOA: 08/01/2020  PCP: [name], MD  Patient coming from: home  I have personally briefly reviewed patient's old medical records in [place]  Chief Complaint: Bilateral leg weakness L>R, painful muscle spasm since several days  HPI: Mary Villanueva is a 64 y.o. female with medical history significant of multiple sclerosis, hypertension, hyperlipidemia, depression/anxiety presents to emergency department with worsening bilateral leg weakness, painful muscle spasm and fall x2.  Patient tells me that she has noticed worsening bilateral leg weakness more on left leg associated with painful muscle spasm, and fell twice and has increasing gait difficulty.   Today when she was walking in her room felt like her legs gave out on her again and she was unable to get up due to pain and weakness. She denies headache, blurry vision, lightheadedness, dizziness, head trauma, loss of consciousness, seizures, chest pain, shortness of breath, palpitation, leg swelling, fever, chills, cough, congestion, nausea, vomiting, diarrhea, abdominal pain, sleep changes. She has chronic urinary incontinence due to overactive bladder however denies stool incontinence.  No history of smoking, alcohol, listed drug use. Uses walker for ambulation. She is vaccinated against COVID-19.  ED Course: Upon arrival to ED: Patient's vital signs stable, initial labs including CBC, UA: Negative, BMP shows mild AKI. COVID-19 pending. CT head/cervical spine showed changes consistent with the given clinical history of MS. No acute abnormality. Multilevel degenerative changes. MRI of lumbar spine shows no evidence of demyelinating disease in the visualized lower spinal cord. Multiple degenerative changes of lumbar spine. EDP consulted neurology who recommended IV steroids for 3 days. [facility] hospitalist consulted for admission for MS exacerbation.  Review  of Systems: As per HPI otherwise negative.    Past Medical History:  Diagnosis Date  . Abdominal pain 05/2018  . Depression   . High cholesterol   . Hypercholesteremia   . Hypertension   . MS (multiple sclerosis) ([place])     Past Surgical History:  Procedure Laterality Date  . [place]     reports that she has quit smoking. Her smoking use included cigarettes. She has never used smokeless tobacco. She reports current alcohol use of about 1.0 standard drink of alcohol per week. She reports that she does not use drugs.  No Known Allergies  Family History  Problem Relation Age of Onset  . Stroke Mother   . Heart disease Father   . Diabetes Sister   . Diabetes Brother     Prior to Admission medications   Medication Sig Start Date End Date Taking? Authorizing Provider  baclofen (LIORESAL) 10 MG tablet Take 1 tablet (10 mg total) by mouth 3 (three) times daily. 08/12/19  Yes Marcial Pacas, MD  Biotin 10 MG CAPS Take 10 mg by mouth every morning.    Yes [provider]  buPROPion (WELLBUTRIN SR) 150 MG 12 hr tablet Take 150 mg by mouth daily.  11/10/14  Yes [provider]  calcium carbonate (OS-CAL) 600 MG TABS Take 600 mg by mouth 2 (two) times daily with a meal.   Yes [provider]  Cholecalciferol (VITAMIN D-3) 1000 UNITS CAPS Take 1,000 Units by mouth 2 (two) times daily.   Yes [provider]  FLUoxetine (PROZAC) 40 MG capsule TAKE 1 CAPSULE BY MOUTH 1  TIME DAILY WITH 20 MG  CAPSULE Patient taking differently: Take 40 mg by mouth daily.  10/30/18  Yes Marcial Pacas, MD  levothyroxine (SYNTHROID) 25 MCG tablet Take 25 mcg by mouth daily before breakfast.   Yes [provider]  Multiple Vitamins-Minerals (CENTRUM SILVER 50+WOMEN PO) Take 1 capsule by mouth daily.   Yes [provider]  Omega-3 Fatty Acids (FISH OIL) 1200 MG CAPS Take 1,200 mg by mouth daily.   Yes [provider]  oxybutynin  (DITROPAN-XL) 10 MG 24 hr tablet Take 10 mg by mouth at bedtime.  07/14/14  Yes [provider]  Probiotic Product (PROBIOTIC DAILY PO) Take 1 capsule by mouth daily.   Yes [provider]  simvastatin (ZOCOR) 20 MG tablet Take 20 mg by mouth every evening.   Yes [provider]  tiZANidine (ZANAFLEX) 4 MG tablet Take 1 tablet (4 mg total) by mouth at bedtime. 08/12/19  Yes Marcial Pacas, MD  valsartan (DIOVAN) 320 MG tablet Take 320 mg by mouth daily.   Yes [provider]  vitamin B-12 (CYANOCOBALAMIN) 500 MCG tablet Take 500 mcg by mouth daily.   Yes [provider]    Physical Exam: Vitals:   08/01/20 1245 08/01/20 1254  BP: 130/63   Pulse: 72   Resp: 15   Temp: 98.6 F (37 C)   TempSrc: Oral   SpO2: 100%   Weight:  66 kg  Height:  5\' 1"  (1.549 m)    Constitutional: NAD, calm, comfortable, on room air, communicating well Eyes: PERRL, lids and conjunctivae normal ENMT: Mucous membranes are moist. Posterior pharynx clear of any exudate or lesions.Normal dentition.  Neck: normal, supple, no masses, no thyromegaly Respiratory: clear to auscultation bilaterally, no wheezing, no crackles. Normal respiratory effort. No accessory muscle use.  Cardiovascular: Regular rate and rhythm, no murmurs / rubs / gallops. No extremity edema. 2+ pedal pulses. No carotid bruits.  Abdomen: no tenderness, no masses palpated. No hepatosplenomegaly. Bowel sounds positive.  Musculoskeletal: no clubbing / cyanosis. No joint deformity upper and lower extremities. Good ROM, no contractures. Normal muscle tone.  Skin: no rashes, lesions, ulcers. No induration Neurologic: CN 2-12 grossly intact. Sensation intact, DTR normal. Severe spasm on left leg. Power 5 out of 5 in bilateral upper extremities, 3 out of 5 in right lower extremity. Psychiatric: Normal judgment and insight. Alert and oriented x 3. Normal mood.    Labs on Admission: I have personally reviewed  following labs and imaging studies  CBC: Recent Labs  Lab 08/01/20 1338  WBC 5.6  NEUTROABS 4.0  HGB 11.2*  HCT 34.7*  MCV 96.7  PLT 035   Basic Metabolic Panel: Recent Labs  Lab 08/01/20 1338  NA 141  K 3.9  CL 107  CO2 26  GLUCOSE 87  BUN 23  CREATININE 1.20*  CALCIUM 9.0   GFR: Estimated Creatinine Clearance: 41.2 mL/min (A) (by C-G formula based on SCr of 1.2 mg/dL (H)). Liver Function Tests: No results for input(s): AST, ALT, ALKPHOS, BILITOT, PROT, ALBUMIN in the last 168 hours. No results for input(s): LIPASE, AMYLASE in the last 168 hours. No results for input(s): AMMONIA in the last 168 hours. Coagulation Profile: No results for input(s): INR, PROTIME in the last 168 hours. Cardiac Enzymes: No results for input(s): CKTOTAL, CKMB, CKMBINDEX, TROPONINI in the last 168 hours. BNP (last 3 results) No results for input(s): PROBNP in the last 8760 hours. HbA1C: No results for input(s): HGBA1C in the last 72 hours. CBG: No results for input(s): GLUCAP in the last 168 hours. Lipid Profile: No results for input(s): CHOL, HDL, LDLCALC, TRIG, CHOLHDL, LDLDIRECT  in the last 72 hours. Thyroid Function Tests: No results for input(s): TSH, T4TOTAL, FREET4, T3FREE, THYROIDAB in the last 72 hours. Anemia Panel: No results for input(s): VITAMINB12, FOLATE, FERRITIN, TIBC, IRON, RETICCTPCT in the last 72 hours. Urine analysis:    Component Value Date/Time   COLORURINE YELLOW 08/01/2020 1440   APPEARANCEUR CLEAR 08/01/2020 1440   APPEARANCEUR Clear 10/14/2018 0843   LABSPEC 1.014 08/01/2020 1440   PHURINE 5.0 08/01/2020 1440   GLUCOSEU NEGATIVE 08/01/2020 1440   HGBUR NEGATIVE 08/01/2020 1440   BILIRUBINUR NEGATIVE 08/01/2020 1440   BILIRUBINUR Negative 10/14/2018 [place] 08/01/2020 1440   PROTEINUR NEGATIVE 08/01/2020 1440   NITRITE NEGATIVE 08/01/2020 1440   LEUKOCYTESUR NEGATIVE 08/01/2020 1440    Radiological Exams on Admission: CT Head Wo  Contrast  Result Date: 08/01/2020 CLINICAL DATA:  Recent fall with headaches and neck pain, initial encounter EXAM: CT HEAD WITHOUT CONTRAST CT CERVICAL SPINE WITHOUT CONTRAST TECHNIQUE: Multidetector CT imaging of the head and cervical spine was performed following the standard protocol without intravenous contrast. Multiplanar CT image reconstructions of the cervical spine were also generated. COMPARISON:  11/04/2018 MRI FINDINGS: CT HEAD FINDINGS Brain: No findings to suggest acute hemorrhage, acute infarction or space-occupying mass lesion are noted. There are geographic areas of decreased attenuation which correspond with that seen on recent MRI as well as with patient's given clinical history. Vascular: No hyperdense vessel or unexpected calcification. Skull: Normal. Negative for fracture or focal lesion. Sinuses/Orbits: No acute finding. Other: None. CT CERVICAL SPINE FINDINGS Alignment: Normal. Skull base and vertebrae: 7 cervical segments are well visualized. Vertebral body height is well maintained. No acute fracture or acute facet abnormality is noted. Mild facet hypertrophic changes are seen. Mild osteophytic changes are noted most prominent posteriorly at C3-4. Soft tissues and spinal canal: Surrounding soft tissue structures appear within normal limits with the exception of a 2.3 cm hypodense nodule within the left lobe of the thyroid. Upper chest: Visualized lung apices are unremarkable. Other: None IMPRESSION: CT of the head: Changes consistent with the given clinical history of MS. No acute abnormality noted. CT of the cervical spine: Multilevel degenerative change. 2.3 cm hypodense nodule in the left lobe of the thyroid. Recommend nonemergent thyroid US(ref: J Am Coll Radiol. 2015 Feb;12(2): 143-50). Electronically Signed   By: Inez Catalina M.D.   On: 08/01/2020 15:04   CT Cervical Spine Wo Contrast  Result Date: 08/01/2020 CLINICAL DATA:  Recent fall with headaches and neck pain, initial  encounter EXAM: CT HEAD WITHOUT CONTRAST CT CERVICAL SPINE WITHOUT CONTRAST TECHNIQUE: Multidetector CT imaging of the head and cervical spine was performed following the standard protocol without intravenous contrast. Multiplanar CT image reconstructions of the cervical spine were also generated. COMPARISON:  11/04/2018 MRI FINDINGS: CT HEAD FINDINGS Brain: No findings to suggest acute hemorrhage, acute infarction or space-occupying mass lesion are noted. There are geographic areas of decreased attenuation which correspond with that seen on recent MRI as well as with patient's given clinical history. Vascular: No hyperdense vessel or unexpected calcification. Skull: Normal. Negative for fracture or focal lesion. Sinuses/Orbits: No acute finding. Other: None. CT CERVICAL SPINE FINDINGS Alignment: Normal. Skull base and vertebrae: 7 cervical segments are well visualized. Vertebral body height is well maintained. No acute fracture or acute facet abnormality is noted. Mild facet hypertrophic changes are seen. Mild osteophytic changes are noted most prominent posteriorly at C3-4. Soft tissues and spinal canal: Surrounding soft tissue structures appear within normal limits with the  exception of a 2.3 cm hypodense nodule within the left lobe of the thyroid. Upper chest: Visualized lung apices are unremarkable. Other: None IMPRESSION: CT of the head: Changes consistent with the given clinical history of MS. No acute abnormality noted. CT of the cervical spine: Multilevel degenerative change. 2.3 cm hypodense nodule in the left lobe of the thyroid. Recommend nonemergent thyroid US(ref: J Am Coll Radiol. 2015 Feb;12(2): 143-50). Electronically Signed   By: Inez Catalina M.D.   On: 08/01/2020 15:04   MR Lumbar Spine W Wo Contrast  Result Date: 08/01/2020 CLINICAL DATA:  Bilateral lower extremity pain and weakness. Unable to stand. History of multiple sclerosis. No injury. EXAM: MRI LUMBAR SPINE WITHOUT AND WITH CONTRAST  TECHNIQUE: Multiplanar and multiecho pulse sequences of the lumbar spine were obtained without and with intravenous contrast. CONTRAST:  62mL GADAVIST GADOBUTROL 1 MMOL/ML IV SOLN COMPARISON:  CT abdomen pelvis dated June 16, 2018. FINDINGS: Segmentation:  Standard. Alignment:  Physiologic. Vertebrae:  No fracture, evidence of discitis, or bone lesion. Conus medullaris and cauda equina: Conus extends to the L2 level. Conus and cauda equina appear normal. No intradural enhancement. Paraspinal and other soft tissues: Negative. Disc levels: T12-L1:  Negative. L1-L2:  Negative. L2-L3: Minimal disc bulging and mild bilateral facet arthropathy with ligamentum flavum hypertrophy. Mild spinal canal stenosis. No neuroforaminal stenosis. L3-L4: Minimal disc bulging and mild bilateral facet arthropathy with ligamentum flavum hypertrophy. Mild spinal canal stenosis. No neuroforaminal stenosis. L4-L5: Negative disc. Mild bilateral facet arthropathy. No stenosis. L5-S1: Negative disc. Mild bilateral facet arthropathy. No stenosis. IMPRESSION: 1. No evidence of demyelinating disease in the visualized lower spinal cord. 2. Mild multilevel degenerative changes of the lumbar spine as described above. No high-grade stenosis or impingement. Electronically Signed   By: Titus Dubin M.D.   On: 08/01/2020 16:32    Assessment/Plan Principal Problem:   Multiple sclerosis exacerbation (HCC) Active Problems:   Hypercholesteremia   Hypertension   AKI (acute kidney injury) ([place])   Hypothyroidism    Multiple sclerosis exacerbation: -Patient presented with bilateral lower extremity weakness and painful muscle spasm. History of MS. -Reviewed CT head/cervical spine and MRI lumbar spine. She is afebrile with no leukocytosis. Vital signs stable. UA negative. COVID-19 pending. -Patient was seen by neurology recommended steroids 1 g daily for 3 days -Admit patient on the floor. -Continue Solu-Medrol 1 g IV daily for 3 days. Continue  IV Protonix. Continue Zanaflex,  -Check B12, folate, TSH level. -hold baclofen due to AKI. Start Robaxin-discussed with pharmacy. -Consult PT -On fall precautions  Hypertension: Blood pressure is stable -Hold valsartan due to AKI. -Hydralazine as needed for blood pressure more than 160/100  Hyperlipidemia: Continue statin  Overactive bladder: Continue oxybutynin  Hypothyroidism: Check TSH -Continue levothyroxine  Depression/anxiety: Continue Wellbutrin, Prozac  Mild AKI:  -Creatinine: 1.20, GFR: 55. Hold nephrotoxic medication. -Repeat BMP tomorrow a.m.  DVT prophylaxis: lovenox/SCD Code Status: Full code Family Communication: None present at bedside.  Plan of care discussed with patient in length and she verbalized understanding and agreed with it. Disposition Plan: Home in 3 days  consults called: Neurology by EDP Admission status: Inpatient   Mckinley Jewel MD [facility]  If 7PM-7AM, please contact night-coverage [email or web address] Password [facility]  08/01/2020, 6:27 PM

## 2020-08-01 NOTE — Progress Notes (Signed)
PT Cancellation Note  Patient Details Name: Nylani Michetti Anton MRN: [id number] DOB: February 19, 1956   Cancelled Treatment:    Reason Eval/Treat Not Completed: Other (comment) Neuro PA at bedside and said he was just getting started with eval and would be talking with pt for awhile. Will follow up as schedule allows.   Lou Miner, DPT  Acute Rehabilitation Services  Pager: [phone] Office: [phone]  Rudean Hitt 08/01/2020, 4:51 PM

## 2020-08-01 NOTE — ED Notes (Signed)
Pt transported to CT ?

## 2020-08-01 NOTE — Telephone Encounter (Signed)
Patient is followed here for MS. She had a pending appt on 09/12/20 but needed to be seen sooner. She has been having more pain. She is in the ED today due to fall. Her appt has been moved to be with Butler Denmark, NP on 08/09/20 at 10:45am (check-in 10:15am). I have left this information on her voicemail. Our office will also make an appointment reminder call to her. Provided our number to call back with any questions.

## 2020-08-01 NOTE — ED Provider Notes (Signed)
[facility] EMERGENCY DEPARTMENT Provider Note   CSN: [id number] Arrival date & time: 08/01/20  1243     History Chief Complaint  Patient presents with  . Fall    Mary Villanueva is a 64 y.o. female with a past medical history of MS, hypertension, hyperlipidemia presenting to the ED with a chief complaint of fall.  States that over the past week she has had more frequent falls related to her MS and lower extremity weakness.  Feels like "my legs are giving out on me."  Had a fall yesterday which she was able to recover from.  However today when she was walking in her home felt like her legs gave out on her again and she was unable to get up due to pain and generalized weakness.  She denies any head injury, loss of consciousness, headache, blurry vision or anticoagulant use.  Does report some paresthesias in her lower extremities as well.  Denies any chest pain, numbness in arms or legs, neck pain or stiffness, back pain, fever.  She last saw her neurologist in January.  States that she will intermittently have episodes similar to this where her lower extremity weakness and fatigue worsens but "it's been a while since it's happened." Ambulates with walker at baseline. States her prior MS flares have felt similar to this.  HPI     Past Medical History:  Diagnosis Date  . Abdominal pain 05/2018  . Depression   . High cholesterol   . Hypercholesteremia   . Hypertension   . MS (multiple sclerosis) [facility])     Patient Active Problem List   Diagnosis Date Noted  . Swelling of both lower extremities 09/09/2019  . Gait abnormality 10/14/2018  . Abdominal pain 06/16/2018  . 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 Hypertension   . Depression   . High cholesterol   . MS (multiple sclerosis) ([place])   . Hypercholesteremia   . Multiple sclerosis ([place]) 03/22/2013    Past Surgical History:  Procedure Laterality Date  . BREAST REDUCTION  SURGERY  1998     OB History   No obstetric history on file.     Family History  Problem Relation Age of Onset  . Stroke Mother   . Heart disease Father   . Diabetes Sister   . Diabetes Brother     Social History   Tobacco Use  . Smoking status: Former Smoker    Types: Cigarettes  . Smokeless tobacco: Never Used  . Tobacco comment: Quit 36 Years ago.  Vaping Use  . Vaping Use: Never used  Substance Use Topics  . Alcohol use: Yes    Alcohol/week: 1.0 standard drink    Types: 1 Glasses of wine per week    Comment: ralely  . Drug use: No    Home Medications Prior to Admission medications   Medication Sig Start Date End Date Taking? Authorizing Provider  baclofen (LIORESAL) 10 MG tablet Take 1 tablet (10 mg total) by mouth 3 (three) times daily. 08/12/19  Yes Marcial Pacas, MD  Biotin 10 MG CAPS Take 10 mg by mouth every morning.    Yes [provider]  buPROPion (WELLBUTRIN SR) 150 MG 12 hr tablet Take 150 mg by mouth daily.  11/10/14  Yes [provider]  calcium carbonate (OS-CAL) 600 MG TABS Take 600 mg by mouth 2 (two) times daily with a meal.   Yes [provider]  Cholecalciferol (VITAMIN D-3) 1000  UNITS CAPS Take 1,000 Units by mouth 2 (two) times daily.   Yes [provider]  FLUoxetine (PROZAC) 40 MG capsule TAKE 1 CAPSULE BY MOUTH 1  TIME DAILY WITH 20 MG  CAPSULE Patient taking differently: Take 40 mg by mouth daily.  10/30/18  Yes Marcial Pacas, MD  levothyroxine (SYNTHROID) 25 MCG tablet Take 25 mcg by mouth daily before breakfast.   Yes [provider]  Multiple Vitamins-Minerals (CENTRUM SILVER 50+WOMEN PO) Take 1 capsule by mouth daily.   Yes [provider]  Omega-3 Fatty Acids (FISH OIL) 1200 MG CAPS Take 1,200 mg by mouth daily.   Yes [provider]  oxybutynin (DITROPAN-XL) 10 MG 24 hr tablet Take 10 mg by mouth at bedtime.  07/14/14  Yes [provider]  Probiotic Product (PROBIOTIC DAILY  PO) Take 1 capsule by mouth daily.   Yes [provider]  simvastatin (ZOCOR) 20 MG tablet Take 20 mg by mouth every evening.   Yes [provider]  tiZANidine (ZANAFLEX) 4 MG tablet Take 1 tablet (4 mg total) by mouth at bedtime. 08/12/19  Yes Marcial Pacas, MD  valsartan (DIOVAN) 320 MG tablet Take 320 mg by mouth daily.   Yes [provider]  vitamin B-12 (CYANOCOBALAMIN) 500 MCG tablet Take 500 mcg by mouth daily.   Yes [provider]    Allergies    Patient has no known allergies.  Review of Systems   Review of Systems  Constitutional: Negative for appetite change, chills and fever.  HENT: Negative for ear pain, rhinorrhea, sneezing and sore throat.   Eyes: Negative for photophobia and visual disturbance.  Respiratory: Negative for cough, chest tightness, shortness of breath and wheezing.   Cardiovascular: Negative for chest pain and palpitations.  Gastrointestinal: Negative for abdominal pain, blood in stool, constipation, diarrhea, nausea and vomiting.  Genitourinary: Negative for dysuria, hematuria and urgency.  Musculoskeletal: Negative for myalgias.  Skin: Negative for rash.  Neurological: Positive for weakness. Negative for dizziness and light-headedness.    Physical Exam Updated Vital Signs BP 130/63 (BP Location: Left Arm)   Pulse 72   Temp 98.6 F (37 C) (Oral)   Resp 15   Ht 5\' 1"  (1.549 m)   Wt 66 kg   SpO2 100%   BMI 27.49 kg/m   Physical Exam Vitals and nursing note reviewed.  Constitutional:      General: She is not in acute distress.    Appearance: She is well-developed.  HENT:     Head: Normocephalic and atraumatic.     Nose: Nose normal.  Eyes:     General: No scleral icterus.       Right eye: No discharge.        Left eye: No discharge.     Conjunctiva/sclera: Conjunctivae normal.     Pupils: Pupils are equal, round, and reactive to light.  Cardiovascular:     Rate and Rhythm: Normal rate and regular rhythm.      Heart sounds: Normal heart sounds. No murmur heard.  No friction rub. No gallop.   Pulmonary:     Effort: Pulmonary effort is normal. No respiratory distress.     Breath sounds: Normal breath sounds.  Abdominal:     General: Bowel sounds are normal. There is no distension.     Palpations: Abdomen is soft.     Tenderness: There is no abdominal tenderness. There is no guarding.  Musculoskeletal:        General: Normal range  of motion.     Cervical back: Normal range of motion and neck supple.     Comments: No tenderness palpation of pelvis or bilateral hips. No deformities noted. Pain at bilateral anterior thigh when she attempts to extend her legs, she keeps them in flexion at knees.  Skin:    General: Skin is warm and dry.     Findings: No rash.  Neurological:     General: No focal deficit present.     Mental Status: She is alert and oriented to person, place, and time.     Cranial Nerves: No cranial nerve deficit.     Sensory: No sensory deficit.     Motor: Weakness present. No abnormal muscle tone.     Coordination: Coordination normal.     Comments: Strength 3/5 in BLE. Strength 5/5 in BUE. Normal sensation to light touch of bilateral upper and lower extremities and face.  No facial asymmetry noted.     ED Results / Procedures / Treatments   Labs (all labs ordered are listed, but only abnormal results are displayed) Labs Reviewed  BASIC METABOLIC PANEL - Abnormal; Notable for the following components:      Result Value   Creatinine, Ser 1.20 (*)    GFR calc non Af Amer 48 (*)    GFR calc Af Amer 55 (*)    All other components within normal limits  CBC WITH DIFFERENTIAL/PLATELET - Abnormal; Notable for the following components:   RBC 3.59 (*)    Hemoglobin 11.2 (*)    HCT 34.7 (*)    All other components within normal limits  URINE CULTURE  SARS CORONAVIRUS 2 BY RT PCR (HOSPITAL ORDER, [facility])  URINALYSIS, ROUTINE W REFLEX MICROSCOPIC     EKG None  Radiology CT Head Wo Contrast  Result Date: 08/01/2020 CLINICAL DATA:  Recent fall with headaches and neck pain, initial encounter EXAM: CT HEAD WITHOUT CONTRAST CT CERVICAL SPINE WITHOUT CONTRAST TECHNIQUE: Multidetector CT imaging of the head and cervical spine was performed following the standard protocol without intravenous contrast. Multiplanar CT image reconstructions of the cervical spine were also generated. COMPARISON:  11/04/2018 MRI FINDINGS: CT HEAD FINDINGS Brain: No findings to suggest acute hemorrhage, acute infarction or space-occupying mass lesion are noted. There are geographic areas of decreased attenuation which correspond with that seen on recent MRI as well as with patient's given clinical history. Vascular: No hyperdense vessel or unexpected calcification. Skull: Normal. Negative for fracture or focal lesion. Sinuses/Orbits: No acute finding. Other: None. CT CERVICAL SPINE FINDINGS Alignment: Normal. Skull base and vertebrae: 7 cervical segments are well visualized. Vertebral body height is well maintained. No acute fracture or acute facet abnormality is noted. Mild facet hypertrophic changes are seen. Mild osteophytic changes are noted most prominent posteriorly at C3-4. Soft tissues and spinal canal: Surrounding soft tissue structures appear within normal limits with the exception of a 2.3 cm hypodense nodule within the left lobe of the thyroid. Upper chest: Visualized lung apices are unremarkable. Other: None IMPRESSION: CT of the head: Changes consistent with the given clinical history of MS. No acute abnormality noted. CT of the cervical spine: Multilevel degenerative change. 2.3 cm hypodense nodule in the left lobe of the thyroid. Recommend nonemergent thyroid US(ref: J Am Coll Radiol. 2015 Feb;12(2): 143-50). Electronically Signed   By: Inez Catalina M.D.   On: 08/01/2020 15:04   CT Cervical Spine Wo Contrast  Result Date: 08/01/2020 CLINICAL DATA:  Recent fall  with headaches and neck pain, initial encounter EXAM: CT HEAD WITHOUT CONTRAST CT CERVICAL SPINE WITHOUT CONTRAST TECHNIQUE: Multidetector CT imaging of the head and cervical spine was performed following the standard protocol without intravenous contrast. Multiplanar CT image reconstructions of the cervical spine were also generated. COMPARISON:  11/04/2018 MRI FINDINGS: CT HEAD FINDINGS Brain: No findings to suggest acute hemorrhage, acute infarction or space-occupying mass lesion are noted. There are geographic areas of decreased attenuation which correspond with that seen on recent MRI as well as with patient's given clinical history. Vascular: No hyperdense vessel or unexpected calcification. Skull: Normal. Negative for fracture or focal lesion. Sinuses/Orbits: No acute finding. Other: None. CT CERVICAL SPINE FINDINGS Alignment: Normal. Skull base and vertebrae: 7 cervical segments are well visualized. Vertebral body height is well maintained. No acute fracture or acute facet abnormality is noted. Mild facet hypertrophic changes are seen. Mild osteophytic changes are noted most prominent posteriorly at C3-4. Soft tissues and spinal canal: Surrounding soft tissue structures appear within normal limits with the exception of a 2.3 cm hypodense nodule within the left lobe of the thyroid. Upper chest: Visualized lung apices are unremarkable. Other: None IMPRESSION: CT of the head: Changes consistent with the given clinical history of MS. No acute abnormality noted. CT of the cervical spine: Multilevel degenerative change. 2.3 cm hypodense nodule in the left lobe of the thyroid. Recommend nonemergent thyroid US(ref: J Am Coll Radiol. 2015 Feb;12(2): 143-50). Electronically Signed   By: Inez Catalina M.D.   On: 08/01/2020 15:04   MR Lumbar Spine W Wo Contrast  Result Date: 08/01/2020 CLINICAL DATA:  Bilateral lower extremity pain and weakness. Unable to stand. History of multiple sclerosis. No injury. EXAM: MRI  LUMBAR SPINE WITHOUT AND WITH CONTRAST TECHNIQUE: Multiplanar and multiecho pulse sequences of the lumbar spine were obtained without and with intravenous contrast. CONTRAST:  57mL GADAVIST GADOBUTROL 1 MMOL/ML IV SOLN COMPARISON:  CT abdomen pelvis dated June 16, 2018. FINDINGS: Segmentation:  Standard. Alignment:  Physiologic. Vertebrae:  No fracture, evidence of discitis, or bone lesion. Conus medullaris and cauda equina: Conus extends to the L2 level. Conus and cauda equina appear normal. No intradural enhancement. Paraspinal and other soft tissues: Negative. Disc levels: T12-L1:  Negative. L1-L2:  Negative. L2-L3: Minimal disc bulging and mild bilateral facet arthropathy with ligamentum flavum hypertrophy. Mild spinal canal stenosis. No neuroforaminal stenosis. L3-L4: Minimal disc bulging and mild bilateral facet arthropathy with ligamentum flavum hypertrophy. Mild spinal canal stenosis. No neuroforaminal stenosis. L4-L5: Negative disc. Mild bilateral facet arthropathy. No stenosis. L5-S1: Negative disc. Mild bilateral facet arthropathy. No stenosis. IMPRESSION: 1. No evidence of demyelinating disease in the visualized lower spinal cord. 2. Mild multilevel degenerative changes of the lumbar spine as described above. No high-grade stenosis or impingement. Electronically Signed   By: Titus Dubin M.D.   On: 08/01/2020 16:32    Procedures Procedures (including critical care time)  Medications Ordered in ED Medications  methylPREDNISolone sodium succinate (SOLU-MEDROL) 1,000 mg in sodium chloride 0.9 % 50 mL IVPB (has no administration in time range)  pantoprazole (PROTONIX) injection 40 mg (has no administration in time range)  gadobutrol (GADAVIST) 1 MMOL/ML injection 6 mL (6 mLs Intravenous Contrast Given 08/01/20 1623)    ED Course  I have reviewed the triage vital signs and the nursing notes.  Pertinent labs & imaging results that were available during my care of the patient were reviewed by me  and considered in my medical decision making (see chart for details).  Clinical Course as of Aug 01 1737  Tue Aug 01, 2020  [place] neurology consult.   [HK]    Clinical Course User Index [HK] Delia Heady, PA-C   MDM Rules/Calculators/A&P                          64 year old female with a past medical history of MS, hypertension, hyperlipidemia presenting to the ED with a chief complaint of fall.  Reports more frequent falls related to lower extremity weakness and numbness over the past few days to 1 week.  Had a fall today when she felt like "my legs were giving out on me."  Was unable to get up which prompted her visit to the ED.  States that this inability to walk is secondary to pain in her thighs as well as generalized weakness.  She has been compliant with her home medications.  Denies any back pain, neck pain, head injury, headache, vision changes, anticoagulant use.  She states that this does feel similar to her prior MS flareups although she has not had one in several years.  On exam patient is having some weakness to bilateral lower extremities although no changes to sensation.  Normal strength and sensation of bilateral upper extremities.  No facial asymmetry noted.  No signs of trauma.  No C, T or L-spine tenderness to palpation at the midline.  Work-up here including CBC, BMP unremarkable.  Urinalysis without signs of infection or other abnormalities.  CT of the head and cervical spine done due to her recent trauma and was negative for acute abnormality.  MRI of the lumbar spine with and without contrast without any acute findings.  Patient was evaluated by neurology at the bedside and they are recommending admission for steroids x3 days for treatment of MS flare.  Patient unfortunately is unable to ambulate here so I have placed a PT consult as well.  Patient is agreeable to the plan.  Patient discussed with and seen by the attending, Dr. Billy Fischer. Appreciate the help of neurology  and hospitalist for management of this patient.   Portions of this note were generated with [employer]. Dictation errors may occur despite best attempts at proofreading.  Final Clinical Impression(s) / ED Diagnoses Final diagnoses:  Multiple sclerosis exacerbation [facility])    Rx / DC Orders ED Discharge Orders    None       Delia Heady, PA-C 08/01/20 1738    Gareth Morgan, MD 08/01/20 2240

## 2020-08-01 NOTE — ED Triage Notes (Signed)
Pt brought to ED from home via [facility] with c/o witnessed fall. Per EMS, pt has MS and has had increasingly unsteady gait recently. Pt states knees buckled beneath her causing the fall; denies LOC, no blood thinner use. Pt endorses bilateral leg spasms rated 7/10. A&Ox4, VSS, NAD.  EMS v/s: 118/82 75 pulse 99& on room air 96.8 temporal CBG 111

## 2020-08-01 NOTE — Telephone Encounter (Signed)
Left message requesting a return call.

## 2020-08-01 NOTE — Consult Note (Addendum)
Neurology Consultation  Reason for Consult: Falls with lower extremity weakness Referring Physician: Gareth Morgan  CC: Increased spasms in lower extremities along with weakness and 2 falls  History is obtained from: Chart and patient  HPI: Mary Villanueva is a 64 y.o. 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  Patient's main symptoms of her MS include spasticity bilateral legs and weakness.  Initially patient started out using a scooter however at this point she is using a walker.  Patient has tried Ampyra with no benefit, she has tried [employer], Rebif in 2006, she states that this made her feel worse, felt weaker, at this point she has plateaued since 2006 and currently is no longer on any immune modulating therapy.  Patient does show plaques in both the brain and also cervical spinal cord.  In May 2014 she had her first EMG guided Botox injection for bilateral lower extremity spasticity.  Looking at the note she stated at that time 70% provement with no side effects.  Patient states that she has not received Botox in quite a long time she cannot remember the last time she received it.  In 2016 per notes, she stated that she was not sure that Botox was actually helping her.  Her last injection was on June 2015 however was associated with high co-pay.  Again looking at the notes in 2019 patient continued to have extreme spasticity and bilateral lower extremities.  At that time she was on baclofen 10 mg, Zanaflex and 4 mg at bedtime.  Patient was seen on October 15 of 2020 and at that time complained of worsening gait abnormality, bilateral ankle swelling, and the need to use electronic scooter for long distances but still able to transfer at home.  Patient came to the emergency department on this visit secondary to having 2 falls and increasing gait difficulty.  She states that on 07/30/2020 she was fine prior to going to bed however during that night she was having multiple spasms.   When she got up the next morning she went to use her walker and felt extremely weak.  At that time she did fall and was having problems standing up.  Throughout the day she remained stationary due to fear of falling.  That night her sister helped her get back into bed.  She states that last night she was having spasms in her leg however not as bad.  However this morning again that she went to use her walker and fell.  For this reason she called Dr. Kellie Simmering office was recommended to come into the ED.  Currently she is in the bed with knees and flexed position secondary to severe spasms.   Past Medical History:  Diagnosis Date  . Abdominal pain 05/2018  . Depression   . High cholesterol   . Hypercholesteremia   . Hypertension   . MS (multiple sclerosis) ([place])      Family History  Problem Relation Age of Onset  . Stroke Mother   . Heart disease Father   . Diabetes Sister   . Diabetes Brother    Social History:   reports that she has quit smoking. Her smoking use included cigarettes. She has never used smokeless tobacco. She reports current alcohol use of about 1.0 standard drink of alcohol per week. She reports that she does not use drugs.  Medications No current facility-administered medications for this encounter.  Current Outpatient Medications:  .  baclofen (LIORESAL) 10 MG tablet, Take 1 tablet (10 mg total)  by mouth 3 (three) times daily., Disp: 270 tablet, Rfl: 3 .  Biotin 10 MG CAPS, Take 10 mg by mouth every morning. , Disp: , Rfl:  .  buPROPion (WELLBUTRIN SR) 150 MG 12 hr tablet, Take 150 mg by mouth daily. , Disp: , Rfl:  .  calcium carbonate (OS-CAL) 600 MG TABS, Take 600 mg by mouth 2 (two) times daily with a meal., Disp: , Rfl:  .  Cholecalciferol (VITAMIN D-3) 1000 UNITS CAPS, Take 1,000 Units by mouth 2 (two) times daily., Disp: , Rfl:  .  FLUoxetine (PROZAC) 40 MG capsule, TAKE 1 CAPSULE BY MOUTH 1  TIME DAILY WITH 20 MG  CAPSULE (Patient taking differently: Take 40 mg  by mouth daily. ), Disp: 90 capsule, Rfl: 0 .  levothyroxine (SYNTHROID) 25 MCG tablet, Take 25 mcg by mouth daily before breakfast., Disp: , Rfl:  .  Multiple Vitamins-Minerals (CENTRUM SILVER 50+WOMEN PO), Take 1 capsule by mouth daily., Disp: , Rfl:  .  Omega-3 Fatty Acids (FISH OIL) 1200 MG CAPS, Take 1,200 mg by mouth daily., Disp: , Rfl:  .  oxybutynin (DITROPAN-XL) 10 MG 24 hr tablet, Take 10 mg by mouth at bedtime. , Disp: , Rfl:  .  Probiotic Product (PROBIOTIC DAILY PO), Take 1 capsule by mouth daily., Disp: , Rfl:  .  simvastatin (ZOCOR) 20 MG tablet, Take 20 mg by mouth every evening., Disp: , Rfl:  .  tiZANidine (ZANAFLEX) 4 MG tablet, Take 1 tablet (4 mg total) by mouth at bedtime., Disp: 90 tablet, Rfl: 3 .  valsartan (DIOVAN) 320 MG tablet, Take 320 mg by mouth daily., Disp: , Rfl:  .  vitamin B-12 (CYANOCOBALAMIN) 500 MCG tablet, Take 500 mcg by mouth daily., Disp: , Rfl:   ROS:    General ROS: Positive for -weakness Psychological ROS: negative for - behavioral disorder, hallucinations, memory difficulties, mood swings or suicidal ideation Ophthalmic ROS: negative for - blurry vision, double vision, eye pain or loss of vision ENT ROS: negative for - epistaxis, nasal discharge, oral lesions, sore throat, tinnitus or vertigo Respiratory ROS: negative for - cough, hemoptysis, shortness of breath or wheezing Cardiovascular ROS: negative for - chest pain, dyspnea on exertion, edema or irregular heartbeat Gastrointestinal ROS: negative for - abdominal pain, diarrhea, hematemesis, nausea/vomiting or stool incontinence Genito-Urinary ROS: negative for - dysuria, hematuria, incontinence or urinary frequency/urgency Musculoskeletal ROS: Positive for -weakness, significant muscle spasms in her lower extremities bilaterally Neurological ROS: as noted in HPI Dermatological ROS: negative for rash and skin lesion changes  Exam: Current vital signs: BP 130/63 (BP Location: Left Arm)    Pulse 72   Temp 98.6 F (37 C) (Oral)   Resp 15   Ht 5\' 1"  (1.549 m)   Wt 66 kg   SpO2 100%   BMI 27.49 kg/m  Vital signs in last 24 hours: Temp:  [98.6 F (37 C)] 98.6 F (37 C) (09/07 1245) Pulse Rate:  [72] 72 (09/07 1245) Resp:  [15] 15 (09/07 1245) BP: (130)/(63) 130/63 (09/07 1245) SpO2:  [100 %] 100 % (09/07 1245) Weight:  [66 kg] 66 kg (09/07 1254)   Constitutional: Appears well-developed and well-nourished.  Psych: Affect appropriate to situation Eyes: No scleral injection HENT: No OP obstrucion Head: Normocephalic.  Cardiovascular: Palpable.  Respiratory: Effort normal, non-labored breathing GI: Soft.  No distension. There is no tenderness.  Skin: WDI  Neuro: Mental Status: Patient is alert and oriented, is able to give a good history, able to follow  commands without difficulty.  Patient shows no aphasia or dysarthria. Cranial Nerves: II: Visual Fields are full.  III,IV, VI: EOMI without ptosis or diploplia. Pupils equal, round and reactive to light V: Facial sensation is symmetric to temperature VII: Facial movement is symmetric.  VIII: hearing is intact to voice X: Palat elevates symmetrically XI: Shoulder shrug is symmetric. XII: tongue is midline without atrophy or fasciculations.  Motor: All extremities show 5/5 strength however, as noted in history and physical she does have severe spasticity of bilateral lower extremities with knee flexion left greater than right at this time. Sensory: Sensation is symmetric to light touch and temperature in the arms and legs. Deep Tendon Reflexes: 3+ bilateral upper extremities.  I cannot elicit patella secondary to the spasticity of her legs Plantars: Downgoing bilaterally Cerebellar: Finger-nose showed no dysmetria however she did have tremor.  Labs I have reviewed labs in epic and the results pertinent to this consultation are:  CBC    Component Value Date/Time   WBC 5.6 08/01/2020 1338   RBC 3.59 (L)  08/01/2020 1338   HGB 11.2 (L) 08/01/2020 1338   HGB 11.7 06/02/2018 0808   HCT 34.7 (L) 08/01/2020 1338   HCT 36.5 06/02/2018 0808   PLT 215 08/01/2020 1338   MCV 96.7 08/01/2020 1338   MCV 95 06/02/2018 0808   MCH 31.2 08/01/2020 1338   MCHC 32.3 08/01/2020 1338   RDW 12.7 08/01/2020 1338   RDW 13.8 06/02/2018 0808   LYMPHSABS 1.0 08/01/2020 1338   LYMPHSABS 1.2 06/02/2018 0808   MONOABS 0.5 08/01/2020 1338   EOSABS 0.1 08/01/2020 1338   EOSABS 0.1 06/02/2018 0808   BASOSABS 0.0 08/01/2020 1338   BASOSABS 0.0 06/02/2018 0808    CMP     Component Value Date/Time   NA 141 08/01/2020 1338   NA 143 06/02/2018 0808   K 3.9 08/01/2020 1338   CL 107 08/01/2020 1338   CO2 26 08/01/2020 1338   GLUCOSE 87 08/01/2020 1338   BUN 23 08/01/2020 1338   BUN 17 06/02/2018 0808   CREATININE 1.20 (H) 08/01/2020 1338   CALCIUM 9.0 08/01/2020 1338   PROT 6.9 06/16/2018 0750   PROT 6.5 06/02/2018 0808   ALBUMIN 3.8 06/16/2018 0750   ALBUMIN 4.3 06/02/2018 0808   AST 26 06/16/2018 0750   ALT 19 06/16/2018 0750   ALKPHOS 70 06/16/2018 0750   BILITOT 0.6 06/16/2018 0750   BILITOT 0.4 06/02/2018 0808   GFRNONAA 48 (L) 08/01/2020 1338   GFRAA 55 (L) 08/01/2020 1338   UA negative for UTI  Imaging I have reviewed the images obtained:  CT-scan of the brain and cervical spine -changes consistent with given clinical history of MS.  No acute abnormality noted.  Multilevel degenerative changes.  MRI of lumbar spine-no evidence of demyelinating disease was visualized in lower spinal cord.  Multilevel degenerative changes of the lumbar spine however no high-grade stenosis or impingement.  Etta Quill PA-C [facility] [phone]  M-F  (9:00 am- 5:00 PM)  08/01/2020, 4:59 PM     Assessment:  This is a 64 year old female who has known multiple sclerosis located in both the brain and also cervical spine.  Patient initially was seen by Dr. Erling Cruz however now is seen by Dr. Krista Blue of  [facility].  Over the last few days patient has noticed a significant increase in her spasticity of bilateral lower extremities and also decreased strength.  Due to having fallen twice secondary to the above patient  has come to the hospital for further evaluation.  At this point she is on no immune modulating medications however she is on baclofen 10 mg 3 times daily along with Zanaflex 4 mg at bedtime.  She has had Botox injections in the past however this initially was helpful however later she felt did not give any resolution to the spasms.  She does state that she has had hospitalization for high-dose steroids in the past but does not recall the last time she has had them.  Given the increase in bilateral lower extremity spasms and weakness will treat as a MS exacerbation as the patients with gait disturbance do benefit from steroids.  MRI of the brain or C- and T-spine, has been largely stable over years and less likely to reveal useful info, hence no need to repeat.  Recommended CT head and C-spine to r/o fracture/traumatic changes. No acute changes.  Impression: -MS exacerbation causing increased spasticity and also weakness in lower extremities resulting in 2 falls  Recommendations: -Protonix 40 mg IV daily -Solu-Medrol 1 g daily for 3 days, may need further 2 doses if no improvement (already checked UA, negative for UTI) -continue Baclofen and Zanaflex -can increase Zanaflex to 4mg  BID(up from home dose of 4mg  qHS) -Consider low dose benzodiazepines which can also help with spasms. Can try Valium 5mg  BID. -Physical therapy -Fall precautions -Check B12, TSH   Attending Neurohospitalist Addendum Patient seen and examined with APP/Resident. Agree with the history and physical as documented above. Agree with the plan as documented, which I helped formulate. I have independently reviewed the chart, obtained history, review of systems and examined the patient.I have  personally reviewed pertinent head/neck/spine imaging (CT/MRI). Please feel free to call with any questions. --- Amie Portland, MD [facility] Pager: [phone]  If 7pm to 7am, please call on call as listed on AMION.

## 2020-08-01 NOTE — Progress Notes (Signed)
PT Cancellation Note  Patient Details Name: Vina Byrd Capelli MRN: [id number] DOB: November 23, 1956   Cancelled Treatment:    Reason Eval/Treat Not Completed: Patient at procedure or test/unavailable Pt currently at MRI. Will follow up as schedule allows.   Lou Miner, DPT  Acute Rehabilitation Services  Pager: [phone] Office: [phone]  Rudean Hitt 08/01/2020, 2:55 PM

## 2020-08-01 NOTE — Telephone Encounter (Signed)
I left the patient a detailed message below. If she calls back, please provide her with the new appt time information.

## 2020-08-02 ENCOUNTER — Encounter (HOSPITAL_COMMUNITY): Payer: Self-pay | Admitting: Internal Medicine

## 2020-08-02 DIAGNOSIS — I10 Essential (primary) hypertension: Secondary | ICD-10-CM

## 2020-08-02 LAB — COMPREHENSIVE METABOLIC PANEL
ALT: 29 U/L (ref 0–44)
AST: 28 U/L (ref 15–41)
Albumin: 3.2 g/dL — ABNORMAL LOW (ref 3.5–5.0)
Alkaline Phosphatase: 68 U/L (ref 38–126)
Anion gap: 8 (ref 5–15)
BUN: 19 mg/dL (ref 8–23)
CO2: 25 mmol/L (ref 22–32)
Calcium: 8.9 mg/dL (ref 8.9–10.3)
Chloride: 105 mmol/L (ref 98–111)
Creatinine, Ser: 1.1 mg/dL — ABNORMAL HIGH (ref 0.44–1.00)
GFR calc Af Amer: >60 mL/min (ref >60)
GFR calc non Af Amer: 53 mL/min — ABNORMAL LOW (ref >60)
Glucose, Bld: 220 mg/dL — ABNORMAL HIGH (ref 70–99)
Potassium: 3.7 mmol/L (ref 3.5–5.1)
Sodium: 138 mmol/L (ref 135–145)
Total Bilirubin: 0.7 mg/dL (ref 0.3–1.2)
Total Protein: 5.8 g/dL — ABNORMAL LOW (ref 6.5–8.1)

## 2020-08-02 LAB — GLUCOSE, CAPILLARY
Glucose-Capillary: 174 mg/dL — ABNORMAL HIGH (ref 70–99)
Glucose-Capillary: 169 mg/dL — ABNORMAL HIGH (ref 70–99)
Glucose-Capillary: 186 mg/dL — ABNORMAL HIGH (ref 70–99)

## 2020-08-02 LAB — CBC
HCT: 34.9 % — ABNORMAL LOW (ref 36.0–46.0)
Hemoglobin: 11.5 g/dL — ABNORMAL LOW (ref 12.0–15.0)
MCH: 31.4 pg (ref 26.0–34.0)
MCHC: 33 g/dL (ref 30.0–36.0)
MCV: 95.4 fL (ref 80.0–100.0)
Platelets: 207 10*3/uL (ref 150–400)
RBC: 3.66 MIL/uL — ABNORMAL LOW (ref 3.87–5.11)
RDW: 12.7 % (ref 11.5–15.5)
WBC: 4.6 10*3/uL (ref 4.0–10.5)
nRBC: 0 % (ref 0.0–0.2)

## 2020-08-02 LAB — HEMOGLOBIN A1C
Hgb A1c MFr Bld: 5.6 % (ref 4.8–5.6)
Mean Plasma Glucose: 114.02 mg/dL

## 2020-08-02 MED ORDER — INSULIN ASPART 100 UNIT/ML ~~LOC~~ SOLN
0.0000 [IU] | Freq: Three times a day (TID) | SUBCUTANEOUS | Status: DC
  Administered 2020-08-02 (×2): 2 [IU] via SUBCUTANEOUS
  Administered 2020-08-03 (×2): 1 [IU] via SUBCUTANEOUS
  Administered 2020-08-03: 2 [IU] via SUBCUTANEOUS
  Administered 2020-08-04: 0 [IU] via SUBCUTANEOUS
  Administered 2020-08-04 (×2): 1 [IU] via SUBCUTANEOUS

## 2020-08-02 MED ORDER — INSULIN ASPART 100 UNIT/ML ~~LOC~~ SOLN
0.0000 [IU] | Freq: Every day | SUBCUTANEOUS | Status: DC
  Administered 2020-08-03: 2 [IU] via SUBCUTANEOUS

## 2020-08-02 MED ORDER — PANTOPRAZOLE SODIUM 40 MG PO TBEC
40.0000 mg | DELAYED_RELEASE_TABLET | Freq: Every day | ORAL | Status: DC
  Administered 2020-08-03 – 2020-08-07 (×5): 40 mg via ORAL
  Filled 2020-08-02 (×6): qty 1

## 2020-08-02 NOTE — TOC Initial Note (Signed)
Transition of Care [facility]) - Initial/Assessment Note    Patient Details  Name: Mary Villanueva MRN: [id number] Date of Birth: Aug 02, 1956  [facility] CM/SW Contact:    Pollie Friar, RN Phone Number: 08/02/2020, 3:46 PM  Clinical Narrative:                 Pt lives alone. She does her own medications and has been driving. Says her sister or other friends can assist with transportation as needed.  Pt uses [facility] delivery for most of her medications but does use [facility] on [address] if needed.  Pt has groceries delivered to the home.  Recommendations for [facility] services. Pt did not have selection for [facility] provider. CM arranged through [facility].  TOC following for further d/c needs.   Expected Discharge Plan: [place] Barriers to Discharge: Continued Medical Work up   Patient Goals and CMS Choice   CMS Medicare.gov Compare Post Acute Care list provided to:: Patient Choice offered to / list presented to : Patient  Expected Discharge Plan and Services Expected Discharge Plan: [place]   Discharge Planning Services: CM Consult Post Acute Care Choice: [place] arrangements for the past 2 months: [place]: PT [place]: [place] Date [place]: 08/02/20   Representative spoke with at [place]: Alvis Lemmings  Prior Living Arrangements/Services Living arrangements for the past 2 months: [place] Lives with:: Self Patient language and need for interpreter reviewed:: Yes Do you feel safe going back to the place where you live?: Yes        Care giver support system in place?: No (comment) (Sister and friend can check in on her) Current home services: DME (walker/ 3 in 1/ shower seat) Criminal Activity/Legal Involvement Pertinent to Current Situation/Hospitalization: No - Comment as needed  Activities of Daily Living Home Assistive  Devices/Equipment: Eyeglasses, Environmental consultant (specify type) ADL Screening (condition at time of admission) Patient's cognitive ability adequate to safely complete daily activities?: Yes Is the patient deaf or have difficulty hearing?: No Does the patient have difficulty seeing, even when wearing glasses/contacts?: No Does the patient have difficulty concentrating, remembering, or making decisions?: No Patient able to express need for assistance with ADLs?: Yes Does the patient have difficulty dressing or bathing?: Yes Independently performs ADLs?: No Communication: Independent Dressing (OT): Needs assistance Is this a change from baseline?: Change from baseline, expected to last <3days Grooming: Independent Feeding: Independent Bathing: Needs assistance Is this a change from baseline?: Change from baseline, expected to last <3 days Toileting: Needs assistance Is this a change from baseline?: Change from baseline, expected to last >3days In/Out Bed: Needs assistance Is this a change from baseline?: Change from baseline, expected to last >3 days Walks in Home: Needs assistance Is this a change from baseline?: Change from baseline, expected to last >3 days Does the patient have difficulty walking or climbing stairs?: Yes Weakness of Legs: Both Weakness of Arms/Hands: None  Permission Sought/Granted                  Emotional Assessment Appearance:: Appears stated age Attitude/Demeanor/Rapport: Engaged Affect (typically observed): Accepting Orientation: : Oriented to Self, Oriented to Place, Oriented to  Time, Oriented to Situation   Psych Involvement: No (comment)  Admission  diagnosis:  Multiple sclerosis exacerbation ([place]) [G35] Patient Active Problem List   Diagnosis Date Noted  . AKI (acute kidney injury) ([place]) 08/01/2020  . Hypothyroidism 08/01/2020  . Swelling of both lower extremities 09/09/2019  . Gait abnormality 10/14/2018  . Abdominal pain 06/16/2018  . 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 Hypertension   . Depression   . High cholesterol   . Multiple sclerosis exacerbation ([place])   . Hypercholesteremia   . Multiple sclerosis ([place]) 03/22/2013   PCP:  [name], MD Pharmacy:   [facility] [address]), [place] [address] [place] [address] Phone: [phone] Fax: [phone]  [place], [address], [address] [place], [place] [address] [place] [address] Phone: [phone] Fax: [phone]     Social Determinants of Health (SDOH) Interventions    Readmission Risk Interventions No flowsheet data found.

## 2020-08-02 NOTE — Plan of Care (Signed)
  Problem: Activity: Goal: Risk for activity intolerance will decrease Outcome: Progressing   Problem: Nutrition: Goal: Adequate nutrition will be maintained Outcome: Progressing  Patient participates in meal planning, verbalizes how steroids affect blood sugar. Patient also requests assistance with ADL's in order to prevent falls.

## 2020-08-02 NOTE — Progress Notes (Signed)
PHARMACIST - PHYSICIAN COMMUNICATION  DR:   Florene Glen  CONCERNING: IV to Oral Route Change Policy  RECOMMENDATION: This patient is receiving protonix by the intravenous route.  Based on criteria approved by the Pharmacy and Therapeutics Committee, the intravenous medication(s) is/are being converted to the equivalent oral dose form(s).   DESCRIPTION: These criteria include:  The patient is eating (either orally or via tube) and/or has been taking other orally administered medications for a least 24 hours  The patient has no evidence of active gastrointestinal bleeding or impaired GI absorption (gastrectomy, short bowel, patient on TNA or NPO).  If you have questions about this conversion, please contact the Pharmacy Department  []   [phone] )  [facility] []   [phone] )  [facility] [x]   [phone] )  [facility] []   [phone] )  [facility] []   [phone] )  [place], [place] 08/02/2020 9:28 AM

## 2020-08-02 NOTE — Evaluation (Signed)
Physical Therapy Evaluation Patient Details Name: Mary Villanueva MRN: [id number] DOB: 07/10/1956 Today's Date: 08/02/2020   History of Present Illness  Kumiko Fishman Shadduck is a 64 y.o. female with medical history significant of multiple sclerosis, hypertension, hyperlipidemia, depression/anxiety presents to emergency department with worsening bilateral leg weakness, painful muscle spasm and fall x2.  Clinical Impression  Patient received in bed, agreeable to PT session. Reports she is feeling better today. Reports no spasms when in bed, but when she tries to get up she has them. Patient is nod independent with supine to sit. Sit to stand with min assist from elevated surface. Posterior lean initially with standing. Able to take a few steps from bed to chair. LE spasms with mobility observed.  She will continue to benefit from skilled PT while here to improve functional mobility and safety for return home.       Follow Up Recommendations Home health PT    Equipment Recommendations  None recommended by PT    Recommendations for Other Services       Precautions / Restrictions Precautions Precautions: Fall Restrictions Weight Bearing Restrictions: No      Mobility  Bed Mobility Overal bed mobility: Modified Independent             General bed mobility comments: use of bed rails, increased time  Transfers Overall transfer level: Needs assistance Equipment used: Rolling walker (2 wheeled) Transfers: Sit to/from Stand Sit to Stand: Min assist;From elevated surface         General transfer comment: requires min assist to boost to standing and to obtain standing balance  Ambulation/Gait Ambulation/Gait assistance: Mod assist Gait Distance (Feet): 4 Feet Assistive device: Rolling walker (2 wheeled) Gait Pattern/deviations: Step-to pattern;Decreased step length - right;Decreased step length - left Gait velocity: decreased   General Gait Details: patient has leg spasms  with mobility where leg will draw up. Also walks on the outside of her left foot and has large callous on that area.  Stairs            Wheelchair Mobility    Modified Rankin (Stroke Patients Only)       Balance Overall balance assessment: Needs assistance;History of Falls Sitting-balance support: Feet supported Sitting balance-Leahy Scale: Fair     Standing balance support: Bilateral upper extremity supported;During functional activity Standing balance-Leahy Scale: Poor Standing balance comment: requires assistance to maintain standing balance, use of RW                             Pertinent Vitals/Pain Pain Assessment: Faces Faces Pain Scale: Hurts little more Pain Location: Legs with spasms Pain Descriptors / Indicators: Spasm Pain Intervention(s): Monitored during session    Home Living Family/patient expects to be discharged to:: Private residence Living Arrangements: Alone Available Help at Discharge: Family;Friend(s);Available PRN/intermittently Type of Home: Apartment Home Access: Level entry     Home Layout: One level Home Equipment: Walker - 4 wheels;Wheelchair - manual;Shower seat      Prior Function Level of Independence: Independent with assistive device(s)         Comments: Patient is typically independent with all ADLs and mobility     Hand Dominance        Extremity/Trunk Assessment   Upper Extremity Assessment Upper Extremity Assessment: Overall WFL for tasks assessed    Lower Extremity Assessment Lower Extremity Assessment: Generalized weakness;RLE deficits/detail;LLE deficits/detail RLE Deficits / Details: increased tone bilaterally. RLE Sensation: decreased light  touch RLE Coordination: decreased gross motor LLE Deficits / Details: increased tone LLE Sensation: decreased light touch LLE Coordination: decreased gross motor    Cervical / Trunk Assessment Cervical / Trunk Assessment: Normal  Communication    Communication: No difficulties  Cognition Arousal/Alertness: Awake/alert Behavior During Therapy: WFL for tasks assessed/performed Overall Cognitive Status: Within Functional Limits for tasks assessed                                        General Comments      Exercises Other Exercises Other Exercises: sit to stand x 3 reps from recliner   Assessment/Plan    PT Assessment Patient needs continued PT services  PT Problem List Decreased strength;Decreased mobility;Decreased activity tolerance;Decreased balance;Impaired sensation;Decreased coordination       PT Treatment Interventions Therapeutic activities;Gait training;Therapeutic exercise;Functional mobility training;Balance training;Patient/family education    PT Goals (Current goals can be found in the Care Plan section)  Acute Rehab PT Goals Patient Stated Goal: to return home, remain independent for as long as possible PT Goal Formulation: With patient Time For Goal Achievement: 08/09/20 Potential to Achieve Goals: Fair    Frequency Min 3X/week   Barriers to discharge Decreased caregiver support      Co-evaluation               AM-PAC PT "6 Clicks" Mobility  Outcome Measure Help needed turning from your back to your side while in a flat bed without using bedrails?: A Little Help needed moving from lying on your back to sitting on the side of a flat bed without using bedrails?: A Little Help needed moving to and from a bed to a chair (including a wheelchair)?: A Little Help needed standing up from a chair using your arms (e.g., wheelchair or bedside chair)?: A Little Help needed to walk in hospital room?: A Lot Help needed climbing 3-5 steps with a railing? : Total 6 Click Score: 15    End of Session Equipment Utilized During Treatment: Gait belt Activity Tolerance: Patient tolerated treatment well Patient left: in chair;with call bell/phone within reach;with chair alarm set Nurse  Communication: Mobility status PT Visit Diagnosis: Unsteadiness on feet (R26.81);Difficulty in walking, not elsewhere classified (R26.2);Other abnormalities of gait and mobility (R26.89);Repeated falls (R29.6)    Time: 1245-1315 PT Time Calculation (min) (ACUTE ONLY): 30 min   Charges:   PT Evaluation $PT Eval Moderate Complexity: 1 Mod PT Treatments $Therapeutic Activity: 8-22 mins        Nolan Lasser, PT, GCS 08/02/20,1:26 PM

## 2020-08-02 NOTE — Progress Notes (Signed)
PROGRESS NOTE    Mary Villanueva  LKG:[id number] DOB: 11-03-1956 DOA: 08/01/2020 PCP: [name], MD    Chief Complaint  Patient presents with  . Fall   Brief Narrative:  Mary Villanueva is Tab Rylee 64 y.o. female with medical history significant of multiple sclerosis, hypertension, hyperlipidemia, depression/anxiety presents to emergency department with worsening bilateral leg weakness, painful muscle spasm and fall x2.  Patient tells me that she has noticed worsening bilateral leg weakness more on left leg associated with painful muscle spasm, and fell twice and has increasing gait difficulty.   Today when she was walking in her room felt like her legs gave out on her again and she was unable to get up due to pain and weakness. She denies headache, blurry vision, lightheadedness, dizziness, head trauma, loss of consciousness, seizures, chest pain, shortness of breath, palpitation, leg swelling, fever, chills, cough, congestion, nausea, vomiting, diarrhea, abdominal pain, sleep changes. She has chronic urinary incontinence due to overactive bladder however denies stool incontinence.  No history of smoking, alcohol, listed drug use. Uses walker for ambulation. She is vaccinated against COVID-19.  ED Course: Upon arrival to ED: Patient's vital signs stable, initial labs including CBC, UA: Negative, BMP shows mild AKI. COVID-19 pending. CT head/cervical spine showed changes consistent with the given clinical history of MS. No acute abnormality. Multilevel degenerative changes. MRI of lumbar spine shows no evidence of demyelinating disease in the visualized lower spinal cord. Multiple degenerative changes of lumbar spine. EDP consulted neurology who recommended IV steroids for 3 days. [facility] hospitalist consulted for admission for MS exacerbation.   Assessment & Plan:   Principal Problem:   Multiple sclerosis exacerbation (HCC) Active Problems:   Hypercholesteremia   Hypertension    AKI (acute kidney injury) ([place])   Hypothyroidism  Multiple sclerosis exacerbation: -Patient presented with bilateral lower extremity weakness and painful muscle spasm. History of MS. - CT head with changes c/w MS, no acute abnormality.  Multilevel degenerative change in C spine. - MR L spine without evidence of demyelinating dz in visualized lower spinal cord - UA bland, negative COVID 19 testing - neurology recommeding 1 g solumedrol x 3 days -Patient was seen by neurology recommended steroids 1 g daily for 3 days (may need up to 5 days if no improvement) - Solumedrol 1 g daily x3 days, protonix 40 mg daily.  - continue zanaflex.   -Admit patient on the floor. -Continue Solu-Medrol 1 g IV daily for 3 days. Continue IV Protonix. Continue Zanaflex,  -Check B12 (wnl), folate (wnl), TSH level (wnl). -hold baclofen due to AKI. Start Robaxin-discussed with pharmacy. -Consult PT -On fall precautions  Hyperglycemia  Steroid Induced Hyperglycemia: 2/2 steroids, continue SSI for now, a1c 5.6  Hypertension: Blood pressure is stable -Hold valsartan due to AKI.  Hyperlipidemia: Continue statin  Overactive bladder: Continue oxybutynin  Hypothyroidism: Check TSH -Continue levothyroxine  Depression/anxiety: Continue Wellbutrin, Prozac  AKI - baseline creatinine appears to be <1 - 1.2 on presentation, continue to monitor  2.3 cm hypodense nodule in L lobe of thyroid:  Needs nonemergent thyroid US  DVT prophylaxis: lovenox Code Status: full  Family Communication: none at bedside Disposition:   Status is: Inpatient  Remains inpatient appropriate because:Inpatient level of care appropriate due to severity of illness   Dispo: The patient is from: Home              Anticipated d/c is to: Home  Anticipated d/c date is: 1 day              Patient currently is not medically stable to d/c. Consultants:   neurology  Procedures  none  Antimicrobials:   Anti-infectives (From admission, onward)   None     Subjective: Feels gradually better Not back to baseline yet LE weakness improving  Objective: Vitals:   08/02/20 0349 08/02/20 0813 08/02/20 1158 08/02/20 1511  BP: (!) 109/57 (!) 125/59 122/62 (!) 124/43  Pulse: 69 75 75 89  Resp: 19 20 20 20   Temp: 98.6 F (37 C) 98.5 F (36.9 C) 98.7 F (37.1 C) 98.2 F (36.8 C)  TempSrc: Oral Oral Oral Oral  SpO2: 99% 98% 98% 100%  Weight:      Height:        Intake/Output Summary (Last 24 hours) at 08/02/2020 1928 Last data filed at 08/02/2020 1430 Gross per 24 hour  Intake 290 ml  Output 400 ml  Net -110 ml   Filed Weights   08/01/20 1254  Weight: 66 kg    Examination:  General exam: Appears calm and comfortable  Respiratory system: Clear to auscultation. Respiratory effort normal. Cardiovascular system: S1 & S2 heard, RRR.  Gastrointestinal system: Abdomen is nondistended, soft and nontender. Central nervous system: Alert and oriented. No focal neurological deficits. Extremities: L>R LE weakness Skin: No rashes, lesions or ulcers Psychiatry: Judgement and insight appear normal. Mood & affect appropriate.     Data Reviewed: I have personally reviewed following labs and imaging studies  CBC: Recent Labs  Lab 08/01/20 1338 08/01/20 1920 08/02/20 0333  WBC 5.6 5.0 4.6  NEUTROABS 4.0  --   --   HGB 11.2* 11.1* 11.5*  HCT 34.7* 34.2* 34.9*  MCV 96.7 96.6 95.4  PLT 215 201 034    Basic Metabolic Panel: Recent Labs  Lab 08/01/20 1338 08/01/20 1920 08/02/20 0333  NA 141  --  138  K 3.9  --  3.7  CL 107  --  105  CO2 26  --  25  GLUCOSE 87  --  220*  BUN 23  --  19  CREATININE 1.20* 0.99 1.10*  CALCIUM 9.0  --  8.9  MG  --  1.9  --   PHOS  --  3.1  --     GFR: Estimated Creatinine Clearance: 44.9 mL/min (Vikram Tillett) (by C-G formula based on SCr of 1.1 mg/dL (H)).  Liver Function Tests: Recent Labs  Lab 08/02/20 0333  AST 28  ALT 29  ALKPHOS 68  BILITOT  0.7  PROT 5.8*  ALBUMIN 3.2*    CBG: Recent Labs  Lab 08/02/20 1156 08/02/20 1620  GLUCAP 174* 169*     Recent Results (from the past 240 hour(s))  SARS Coronavirus 2 by RT PCR (hospital order, performed in [facility] hospital lab) Nasopharyngeal Nasopharyngeal Swab     Status: None   Collection Time: 08/01/20  6:45 PM   Specimen: Nasopharyngeal Swab  Result Value Ref Range Status   SARS Coronavirus 2 NEGATIVE NEGATIVE Final    Comment: (NOTE) SARS-CoV-2 target nucleic acids are NOT DETECTED.  The SARS-CoV-2 RNA is generally detectable in upper and lower respiratory specimens during the acute phase of infection. The lowest concentration of SARS-CoV-2 viral copies this assay can detect is 250 copies / mL. Shadow Stiggers negative result does not preclude SARS-CoV-2 infection and should not be used as the sole basis for treatment or other patient management decisions.  Shakaria Raphael negative result  may occur with improper specimen collection / handling, submission of specimen other than nasopharyngeal swab, presence of viral mutation(s) within the areas targeted by this assay, and inadequate number of viral copies (<250 copies / mL). Meredyth Hornung negative result must be combined with clinical observations, patient history, and epidemiological information.  Fact Sheet for Patients:   StrictlyIdeas.no  Fact Sheet for Healthcare Providers: BankingDealers.co.za  This test is not yet approved or  cleared by the [place] FDA and has been authorized for detection and/or diagnosis of SARS-CoV-2 by FDA under an Emergency Use Authorization (EUA).  This EUA will remain in effect (meaning this test can be used) for the duration of the COVID-19 declaration under Section 564(b)(1) of the Act, 21 U.S.C. section 360bbb-3(b)(1), unless the authorization is terminated or revoked sooner.  Performed at [facility], [place] [address], [place], [place] [address]           Radiology Studies: CT Head Wo Contrast  Result Date: 08/01/2020 CLINICAL DATA:  Recent fall with headaches and neck pain, initial encounter EXAM: CT HEAD WITHOUT CONTRAST CT CERVICAL SPINE WITHOUT CONTRAST TECHNIQUE: Multidetector CT imaging of the head and cervical spine was performed following the standard protocol without intravenous contrast. Multiplanar CT image reconstructions of the cervical spine were also generated. COMPARISON:  11/04/2018 MRI FINDINGS: CT HEAD FINDINGS Brain: No findings to suggest acute hemorrhage, acute infarction or space-occupying mass lesion are noted. There are geographic areas of decreased attenuation which correspond with that seen on recent MRI as well as with patient's given clinical history. Vascular: No hyperdense vessel or unexpected calcification. Skull: Normal. Negative for fracture or focal lesion. Sinuses/Orbits: No acute finding. Other: None. CT CERVICAL SPINE FINDINGS Alignment: Normal. Skull base and vertebrae: 7 cervical segments are well visualized. Vertebral body height is well maintained. No acute fracture or acute facet abnormality is noted. Mild facet hypertrophic changes are seen. Mild osteophytic changes are noted most prominent posteriorly at C3-4. Soft tissues and spinal canal: Surrounding soft tissue structures appear within normal limits with the exception of Texanna Hilburn 2.3 cm hypodense nodule within the left lobe of the thyroid. Upper chest: Visualized lung apices are unremarkable. Other: None IMPRESSION: CT of the head: Changes consistent with the given clinical history of MS. No acute abnormality noted. CT of the cervical spine: Multilevel degenerative change. 2.3 cm hypodense nodule in the left lobe of the thyroid. Recommend nonemergent thyroid US(ref: J Am Coll Radiol. 2015 Feb;12(2): 143-50). Electronically Signed   By: Inez Catalina M.D.   On: 08/01/2020 15:04   CT Cervical Spine Wo Contrast  Result Date: 08/01/2020 CLINICAL DATA:  Recent fall  with headaches and neck pain, initial encounter EXAM: CT HEAD WITHOUT CONTRAST CT CERVICAL SPINE WITHOUT CONTRAST TECHNIQUE: Multidetector CT imaging of the head and cervical spine was performed following the standard protocol without intravenous contrast. Multiplanar CT image reconstructions of the cervical spine were also generated. COMPARISON:  11/04/2018 MRI FINDINGS: CT HEAD FINDINGS Brain: No findings to suggest acute hemorrhage, acute infarction or space-occupying mass lesion are noted. There are geographic areas of decreased attenuation which correspond with that seen on recent MRI as well as with patient's given clinical history. Vascular: No hyperdense vessel or unexpected calcification. Skull: Normal. Negative for fracture or focal lesion. Sinuses/Orbits: No acute finding. Other: None. CT CERVICAL SPINE FINDINGS Alignment: Normal. Skull base and vertebrae: 7 cervical segments are well visualized. Vertebral body height is well maintained. No acute fracture or acute facet abnormality is noted. Mild facet  hypertrophic changes are seen. Mild osteophytic changes are noted most prominent posteriorly at C3-4. Soft tissues and spinal canal: Surrounding soft tissue structures appear within normal limits with the exception of Rosealynn Mateus 2.3 cm hypodense nodule within the left lobe of the thyroid. Upper chest: Visualized lung apices are unremarkable. Other: None IMPRESSION: CT of the head: Changes consistent with the given clinical history of MS. No acute abnormality noted. CT of the cervical spine: Multilevel degenerative change. 2.3 cm hypodense nodule in the left lobe of the thyroid. Recommend nonemergent thyroid US(ref: J Am Coll Radiol. 2015 Feb;12(2): 143-50). Electronically Signed   By: Inez Catalina M.D.   On: 08/01/2020 15:04   MR Lumbar Spine W Wo Contrast  Result Date: 08/01/2020 CLINICAL DATA:  Bilateral lower extremity pain and weakness. Unable to stand. History of multiple sclerosis. No injury. EXAM: MRI  LUMBAR SPINE WITHOUT AND WITH CONTRAST TECHNIQUE: Multiplanar and multiecho pulse sequences of the lumbar spine were obtained without and with intravenous contrast. CONTRAST:  67mL GADAVIST GADOBUTROL 1 MMOL/ML IV SOLN COMPARISON:  CT abdomen pelvis dated June 16, 2018. FINDINGS: Segmentation:  Standard. Alignment:  Physiologic. Vertebrae:  No fracture, evidence of discitis, or bone lesion. Conus medullaris and cauda equina: Conus extends to the L2 level. Conus and cauda equina appear normal. No intradural enhancement. Paraspinal and other soft tissues: Negative. Disc levels: T12-L1:  Negative. L1-L2:  Negative. L2-L3: Minimal disc bulging and mild bilateral facet arthropathy with ligamentum flavum hypertrophy. Mild spinal canal stenosis. No neuroforaminal stenosis. L3-L4: Minimal disc bulging and mild bilateral facet arthropathy with ligamentum flavum hypertrophy. Mild spinal canal stenosis. No neuroforaminal stenosis. L4-L5: Negative disc. Mild bilateral facet arthropathy. No stenosis. L5-S1: Negative disc. Mild bilateral facet arthropathy. No stenosis. IMPRESSION: 1. No evidence of demyelinating disease in the visualized lower spinal cord. 2. Mild multilevel degenerative changes of the lumbar spine as described above. No high-grade stenosis or impingement. Electronically Signed   By: Titus Dubin M.D.   On: 08/01/2020 16:32        Scheduled Meds: . buPROPion  150 mg Oral Daily  . enoxaparin (LOVENOX) injection  40 mg Subcutaneous Q24H  . FLUoxetine  40 mg Oral Daily  . insulin aspart  0-5 Units Subcutaneous QHS  . insulin aspart  0-9 Units Subcutaneous TID WC  . levothyroxine  25 mcg Oral QAC breakfast  . methocarbamol  500 mg Oral TID  . oxybutynin  10 mg Oral QHS  . pantoprazole  40 mg Oral Daily  . simvastatin  20 mg Oral QPM  . tiZANidine  4 mg Oral QHS  . vitamin B-12  500 mcg Oral Daily   Continuous Infusions: . methylPREDNISolone (SOLU-MEDROL) injection 1,000 mg (08/02/20 0940)      LOS: 1 day    Time spent: over 30 min    Fayrene Helper, MD [facility]   To contact the attending provider between 7A-7P or the covering provider during after hours 7P-7A, please log into the web site [email or web address] and access using universal  password for that web site. If you do not have the password, please call the hospital operator.  08/02/2020, 7:28 PM

## 2020-08-03 LAB — MAGNESIUM: Magnesium: 2 mg/dL (ref 1.7–2.4)

## 2020-08-03 LAB — CBC WITH DIFFERENTIAL/PLATELET
Abs Immature Granulocytes: 0.03 10*3/uL (ref 0.00–0.07)
Basophils Absolute: 0 10*3/uL (ref 0.0–0.1)
Basophils Relative: 0 %
Eosinophils Absolute: 0 10*3/uL (ref 0.0–0.5)
Eosinophils Relative: 0 %
HCT: 32.6 % — ABNORMAL LOW (ref 36.0–46.0)
Hemoglobin: 10.9 g/dL — ABNORMAL LOW (ref 12.0–15.0)
Immature Granulocytes: 0 %
Lymphocytes Relative: 5 %
Lymphs Abs: 0.5 10*3/uL — ABNORMAL LOW (ref 0.7–4.0)
MCH: 31.8 pg (ref 26.0–34.0)
MCHC: 33.4 g/dL (ref 30.0–36.0)
MCV: 95 fL (ref 80.0–100.0)
Monocytes Absolute: 0.3 10*3/uL (ref 0.1–1.0)
Monocytes Relative: 3 %
Neutro Abs: 9.7 10*3/uL — ABNORMAL HIGH (ref 1.7–7.7)
Neutrophils Relative %: 92 %
Platelets: 201 10*3/uL (ref 150–400)
RBC: 3.43 MIL/uL — ABNORMAL LOW (ref 3.87–5.11)
RDW: 12.8 % (ref 11.5–15.5)
WBC: 10.5 10*3/uL (ref 4.0–10.5)
nRBC: 0 % (ref 0.0–0.2)

## 2020-08-03 LAB — COMPREHENSIVE METABOLIC PANEL
ALT: 24 U/L (ref 0–44)
AST: 27 U/L (ref 15–41)
Albumin: 3.1 g/dL — ABNORMAL LOW (ref 3.5–5.0)
Alkaline Phosphatase: 62 U/L (ref 38–126)
Anion gap: 8 (ref 5–15)
BUN: 26 mg/dL — ABNORMAL HIGH (ref 8–23)
CO2: 24 mmol/L (ref 22–32)
Calcium: 8.8 mg/dL — ABNORMAL LOW (ref 8.9–10.3)
Chloride: 107 mmol/L (ref 98–111)
Creatinine, Ser: 0.93 mg/dL (ref 0.44–1.00)
GFR calc Af Amer: >60 mL/min (ref >60)
GFR calc non Af Amer: >60 mL/min (ref >60)
Glucose, Bld: 155 mg/dL — ABNORMAL HIGH (ref 70–99)
Potassium: 4.1 mmol/L (ref 3.5–5.1)
Sodium: 139 mmol/L (ref 135–145)
Total Bilirubin: 0.6 mg/dL (ref 0.3–1.2)
Total Protein: 5.9 g/dL — ABNORMAL LOW (ref 6.5–8.1)

## 2020-08-03 LAB — PHOSPHORUS: Phosphorus: 2.7 mg/dL (ref 2.5–4.6)

## 2020-08-03 LAB — GLUCOSE, CAPILLARY
Glucose-Capillary: 139 mg/dL — ABNORMAL HIGH (ref 70–99)
Glucose-Capillary: 164 mg/dL — ABNORMAL HIGH (ref 70–99)
Glucose-Capillary: 147 mg/dL — ABNORMAL HIGH (ref 70–99)
Glucose-Capillary: 250 mg/dL — ABNORMAL HIGH (ref 70–99)

## 2020-08-03 MED ORDER — BACLOFEN 10 MG PO TABS
10.0000 mg | ORAL_TABLET | Freq: Three times a day (TID) | ORAL | Status: DC
  Administered 2020-08-03 – 2020-08-07 (×12): 10 mg via ORAL
  Filled 2020-08-03 (×12): qty 1

## 2020-08-03 NOTE — Consult Note (Signed)
Physical Medicine and Rehabilitation Consult Reason for Consult: MS exacerbation Referring Physician: [facility]   HPI: Mary Villanueva is a 64 y.o. right-handed female with history of depression, tobacco abuse, hyperlipidemia, hypertension and multiple sclerosis.  History taken from chart review and patient.  Patient lives alone.  1 level home.  Independent with assistive device.  She presented on 08/01/2020 with increasing lower extremity spasms, weakness, falls.  CT of head and cervical spine showed changes consistent with multiple sclerosis.  CT cervical spine multilevel degenerative changes.  Incidental finding of a 2.3 cm hypodense nodule in the left lobe of the thyroid.  Admission chemistries creatinine 1.20, urinalysis negative.  Placed on IV Solu-Medrol per neurology services.  Hospital course further complicated by steroid-induced hyperglycemia, AKI.  Subcutaneous Lovenox added for DVT prophylaxis.  Therapy evaluations completed with recommendations of physical medicine rehab consult.  Review of Systems  Constitutional: Positive for malaise/fatigue. Negative for chills and fever.  HENT: Negative for hearing loss.   Eyes: Negative for blurred vision and double vision.  Respiratory: Negative for cough and shortness of breath.   Cardiovascular: Negative for chest pain, palpitations and leg swelling.  Gastrointestinal: Positive for constipation. Negative for heartburn, nausea and vomiting.  Genitourinary: Positive for urgency.  Musculoskeletal: Positive for falls.  Skin: Negative for rash.  Neurological: Positive for sensory change, focal weakness and weakness.       Spasms  All other systems reviewed and are negative.  Past Medical History:  Diagnosis Date  . Abdominal pain 05/2018  . Depression   . High cholesterol   . Hypercholesteremia   . Hypertension   . MS (multiple sclerosis) ([place])    Past Surgical History:  Procedure Laterality Date  . BREAST REDUCTION SURGERY   1998   Family History  Problem Relation Age of Onset  . Stroke Mother   . Heart disease Father   . Diabetes Sister   . Diabetes Brother    Social History:  reports that she has quit smoking. Her smoking use included cigarettes. She has never used smokeless tobacco. She reports current alcohol use of about 1.0 standard drink of alcohol per week. She reports that she does not use drugs. Allergies: No Known Allergies Medications Prior to Admission  Medication Sig Dispense Refill  . baclofen (LIORESAL) 10 MG tablet Take 1 tablet (10 mg total) by mouth 3 (three) times daily. 270 tablet 3  . Biotin 10 MG CAPS Take 10 mg by mouth every morning.     Marland Kitchen buPROPion (WELLBUTRIN SR) 150 MG 12 hr tablet Take 150 mg by mouth daily.     . calcium carbonate (OS-CAL) 600 MG TABS Take 600 mg by mouth 2 (two) times daily with a meal.    . Cholecalciferol (VITAMIN D-3) 1000 UNITS CAPS Take 1,000 Units by mouth 2 (two) times daily.    Marland Kitchen FLUoxetine (PROZAC) 40 MG capsule TAKE 1 CAPSULE BY MOUTH 1  TIME DAILY WITH 20 MG  CAPSULE (Patient taking differently: Take 40 mg by mouth daily. ) 90 capsule 0  . levothyroxine (SYNTHROID) 25 MCG tablet Take 25 mcg by mouth daily before breakfast.    . Multiple Vitamins-Minerals (CENTRUM SILVER 50+WOMEN PO) Take 1 capsule by mouth daily.    . Omega-3 Fatty Acids (FISH OIL) 1200 MG CAPS Take 1,200 mg by mouth daily.    Marland Kitchen oxybutynin (DITROPAN-XL) 10 MG 24 hr tablet Take 10 mg by mouth at bedtime.     . Probiotic Product (PROBIOTIC  DAILY PO) Take 1 capsule by mouth daily.    . simvastatin (ZOCOR) 20 MG tablet Take 20 mg by mouth every evening.    Marland Kitchen tiZANidine (ZANAFLEX) 4 MG tablet Take 1 tablet (4 mg total) by mouth at bedtime. 90 tablet 3  . valsartan (DIOVAN) 320 MG tablet Take 320 mg by mouth daily.    . vitamin B-12 (CYANOCOBALAMIN) 500 MCG tablet Take 500 mcg by mouth daily.      Home: Home Living Family/patient expects to be discharged to:: Private residence Living  Arrangements: Alone Available Help at Discharge: Family, Friend(s), Available PRN/intermittently Type of Home: House Home Access: Level entry [place]: One level Bathroom Shower/Tub: Multimedia programmer: Friendship: Environmental consultant - 4 wheels, Wheelchair - manual, Civil engineer, contracting, Geneticist, molecular, Grab bars - tub/shower, Transport planner (suction grabbars in shower )  Functional History: Prior Function Level of Independence: Independent with assistive device(s) Comments: uses rollator with moblity at home, independent IADLs, +driving  Functional Status:  Mobility: Bed Mobility Overal bed mobility: Modified Independent General bed mobility comments: HOB elevated and use of bed rails, increased time and effort  Transfers Overall transfer level: Needs assistance Equipment used: 4-wheeled walker Transfers: Sit to/from Stand Sit to Stand: Mod assist Stand pivot transfers: Min assist General transfer comment: mod assist to power up from EOB, min assist from seated on rollator; good hand placement but increased effort and steadying assist required for full ascend  Ambulation/Gait Ambulation/Gait assistance: Min assist Gait Distance (Feet): 10 Feet Assistive device: Rolling walker (2 wheeled) Gait Pattern/deviations: Step-to pattern, Decreased step length - right, Decreased step length - left, Decreased stride length, Narrow base of support General Gait Details: improved ability for ambulation this day. Pivoted to recliner initially, then stood again and walked 10 feet forward. limited by fatigue. Gait velocity: decreased    ADL: ADL Overall ADL's : Needs assistance/impaired Grooming: Set up, Sitting Grooming Details (indicate cue type and reason): at sink  Upper Body Bathing: Set up, Sitting Lower Body Bathing: Moderate assistance, Sit to/from stand Upper Body Dressing : Set up, Sitting Lower Body Dressing: Moderate assistance, Sit to/from stand Lower Body Dressing  Details (indicate cue type and reason): pt able to figure 4 LEs with effort, min guard dynamically sitting and would need assist to don socks; mod assist to fully power up and relies on UE support in standing  Toilet Transfer: Moderate assistance, Ambulation (rollator ) Toilet Transfer Details (indicate cue type and reason): simulated in room using rollator  Functional mobility during ADLs: Minimal assistance, Moderate assistance (rollator, mod assist with LB turning towards sink to sit ) General ADL Comments: pt limited by weakness, impaired balance and decreased activity tolerance  Cognition: Cognition Overall Cognitive Status: Within Functional Limits for tasks assessed Orientation Level: Oriented X4 Cognition Arousal/Alertness: Awake/alert Behavior During Therapy: WFL for tasks assessed/performed Overall Cognitive Status: Within Functional Limits for tasks assessed  Blood pressure 135/60, pulse 69, temperature 98.5 F (36.9 C), temperature source Oral, resp. rate 20, height 5\' 1"  (1.549 m), weight 66 kg, SpO2 98 %. Physical Exam Constitutional:      General: She is not in acute distress.    Appearance: Normal appearance. She is not ill-appearing.  HENT:     Head: Normocephalic and atraumatic.     Right Ear: External ear normal.     Left Ear: External ear normal.     Nose: Nose normal.  Eyes:     General:  Right eye: No discharge.        Left eye: No discharge.     Extraocular Movements: Extraocular movements intact.  Cardiovascular:     Rate and Rhythm: Normal rate and regular rhythm.  Pulmonary:     Effort: Pulmonary effort is normal. No respiratory distress.     Breath sounds: Normal breath sounds. No stridor.  Abdominal:     General: Abdomen is flat. Bowel sounds are normal. There is no distension.  Musculoskeletal:     Cervical back: Normal range of motion.     Comments: No edema or tenderness in extremities Left foot deformity  Skin:    General: Skin is warm  and dry.  Neurological:     Mental Status: She is alert.     Comments: Alert in no acute distress oriented x3 with fair awareness of deficits. Motor: Bilateral upper extremities: 5/5 proximal distal Bilateral lower extremities limited due to tone: Left lower extremity 3/5 proximal to distal Right lower extremity: 3+/5 proximal distal  Psychiatric:        Mood and Affect: Mood normal.        Behavior: Behavior normal.        Thought Content: Thought content normal.     Results for orders placed or performed during the hospital encounter of 08/01/20 (from the past 24 hour(s))  Glucose, capillary     Status: Abnormal   Collection Time: 08/03/20 12:51 PM  Result Value Ref Range   Glucose-Capillary 164 (H) 70 - 99 mg/dL   Comment 1 Notify RN    Comment 2 Document in Chart   Glucose, capillary     Status: Abnormal   Collection Time: 08/03/20  3:35 PM  Result Value Ref Range   Glucose-Capillary 147 (H) 70 - 99 mg/dL   Comment 1 Notify RN    Comment 2 Document in Chart   Glucose, capillary     Status: Abnormal   Collection Time: 08/03/20  9:05 PM  Result Value Ref Range   Glucose-Capillary 250 (H) 70 - 99 mg/dL  CBC with Differential/Platelet     Status: Abnormal   Collection Time: 08/04/20  1:56 AM  Result Value Ref Range   WBC 9.0 4.0 - 10.5 K/uL   RBC 3.56 (L) 3.87 - 5.11 MIL/uL   Hemoglobin 11.1 (L) 12.0 - 15.0 g/dL   HCT 33.5 (L) 36 - 46 %   MCV 94.1 80.0 - 100.0 fL   MCH 31.2 26.0 - 34.0 pg   MCHC 33.1 30.0 - 36.0 g/dL   RDW 13.1 11.5 - 15.5 %   Platelets 202 150 - 400 K/uL   nRBC 0.0 0.0 - 0.2 %   Neutrophils Relative % 93 %   Neutro Abs 8.3 (H) 1.7 - 7.7 K/uL   Lymphocytes Relative 4 %   Lymphs Abs 0.3 (L) 0.7 - 4.0 K/uL   Monocytes Relative 2 %   Monocytes Absolute 0.2 0 - 1 K/uL   Eosinophils Relative 0 %   Eosinophils Absolute 0.0 0 - 0 K/uL   Basophils Relative 0 %   Basophils Absolute 0.0 0 - 0 K/uL   Immature Granulocytes 1 %   Abs Immature Granulocytes  0.10 (H) 0.00 - 0.07 K/uL  Comprehensive metabolic panel     Status: Abnormal   Collection Time: 08/04/20  1:56 AM  Result Value Ref Range   Sodium 142 135 - 145 mmol/L   Potassium 3.9 3.5 - 5.1 mmol/L   Chloride 108 98 -  111 mmol/L   CO2 24 22 - 32 mmol/L   Glucose, Bld 128 (H) 70 - 99 mg/dL   BUN 30 (H) 8 - 23 mg/dL   Creatinine, Ser 1.34 (H) 0.44 - 1.00 mg/dL   Calcium 8.8 (L) 8.9 - 10.3 mg/dL   Total Protein 6.0 (L) 6.5 - 8.1 g/dL   Albumin 3.3 (L) 3.5 - 5.0 g/dL   AST 22 15 - 41 U/L   ALT 26 0 - 44 U/L   Alkaline Phosphatase 64 38 - 126 U/L   Total Bilirubin 0.5 0.3 - 1.2 mg/dL   GFR calc non Af Amer 42 (L) >60 mL/min   GFR calc Af Amer 48 (L) >60 mL/min   Anion gap 10 5 - 15  Magnesium     Status: None   Collection Time: 08/04/20  1:56 AM  Result Value Ref Range   Magnesium 2.1 1.7 - 2.4 mg/dL  Phosphorus     Status: None   Collection Time: 08/04/20  1:56 AM  Result Value Ref Range   Phosphorus 2.7 2.5 - 4.6 mg/dL  Glucose, capillary     Status: Abnormal   Collection Time: 08/04/20  6:24 AM  Result Value Ref Range   Glucose-Capillary 126 (H) 70 - 99 mg/dL   No results found.  Assessment/Plan: Diagnosis: Multiple sclerosis exacerbation Labs independently reviewed.  Records reviewed and summated above.  1. Does the need for close, 24 hr/day medical supervision in concert with the patient's rehab needs make it unreasonable for this patient to be served in a less intensive setting? Potentially 2. Co-Morbidities requiring supervision/potential complications: depression (ensure mood does not hinder progress of therapies), tobacco abuse, hyperlipidemia, HTN (monitor and provide prns in accordance with increased physical exertion and pain), multiple sclerosis, AKI (avoid nephrotoxic meds, repeat labs), steroid-induced hyperglycemia (Monitor in accordance with exercise and adjust meds as necessary) 3. Due to bladder management, safety, disease management, pain management and  patient education, does the patient require 24 hr/day rehab nursing? Potentially 4. Does the patient require coordinated care of a physician, rehab nurse, therapy disciplines of PT/OT to address physical and functional deficits in the context of the above medical diagnosis(es)? Potentially Addressing deficits in the following areas: balance, endurance, locomotion, strength, transferring, bathing, dressing, toileting and psychosocial support 5. Can the patient actively participate in an intensive therapy program of at least 3 hrs of therapy per day at least 5 days per week? Yes 6. The potential for patient to make measurable gains while on inpatient rehab is good 7. Anticipated functional outcomes upon discharge from inpatient rehab are modified independent and supervision  with PT, modified independent and supervision with OT, n/a with SLP. 8. Estimated rehab length of stay to reach the above functional goals is: 3-6 days. 9. Anticipated discharge destination: Home 10. Overall Rehab/Functional Prognosis: good  RECOMMENDATIONS: This patient's condition is appropriate for continued rehabilitative care in the following setting: Will await reevaluation by therapies after initiation of antispasticity medications along with steroids.  The patient does not progress to supervision level of functioning, recommend CIR. Patient has agreed to participate in recommended program. Yes Note that insurance prior authorization may be required for reimbursement for recommended care.  Comment: Rehab Admissions Coordinator to follow up.  I have personally performed a face to face diagnostic evaluation, including, but not limited to relevant history and physical exam findings, of this patient and developed relevant assessment and plan.  Additionally, I have reviewed and concur with the physician assistant's documentation  above.   Delice Lesch, MD, ABPMR Lavon Paganini Angiulli, PA-C 08/04/2020

## 2020-08-03 NOTE — Progress Notes (Signed)
Rehab Admissions Coordinator Note:  Patient was screened by Cleatrice Burke for appropriateness for an Inpatient Acute Rehab Consult per change in therapy recommendation.   At this time, we are recommending Inpatient Rehab consult if patient would like to be considered for admit. [facility] beds limited this week so other venues also would ned to be considered if patient felt medically ready this week. Please advise.   Cleatrice Burke RN MSN 08/03/2020, 1:05 PM  I can be reached at [phone].

## 2020-08-03 NOTE — Progress Notes (Signed)
Physical Therapy Treatment Patient Details Name: Mary Villanueva MRN: [id number] DOB: May 08, 1956 Today's Date: 08/03/2020    History of Present Illness Mary Villanueva Perdew is a 64 y.o. female with medical history significant of multiple sclerosis, hypertension, hyperlipidemia, depression/anxiety presents to emergency department with worsening bilateral leg weakness, painful muscle spasm and fall x2.    PT Comments    Patient received in bed, reports he legs continue to have spasms. She reports she attempted to stand earlier and had a difficult time. Performed bed mobility with mod independence. Transfers sit to stand with min/mod assist for boosting up and balance. She pivoted over to recliner initially and then was able to stand again from recliner and ambulated 10 feet with rw and min assist. Slow pace with left LE having spasms. Patient will continue to benefit from skilled PT while here to improve functional mobility and independence to achieve maximal independence level. She is very motivated to remain as independent as possible.     Follow Up Recommendations  CIR     Equipment Recommendations  None recommended by PT    Recommendations for Other Services Rehab consult     Precautions / Restrictions Precautions Precautions: Fall Restrictions Weight Bearing Restrictions: No    Mobility  Bed Mobility Overal bed mobility: Modified Independent             General bed mobility comments: use of bed rails, increased time  Transfers Overall transfer level: Needs assistance Equipment used: Rolling walker (2 wheeled) Transfers: Sit to/from [facility] Sit to Stand: Min assist;From elevated surface Stand pivot transfers: Min assist       General transfer comment: requires min assist to boost to standing and to obtain standing balance  Ambulation/Gait Ambulation/Gait assistance: Min assist Gait Distance (Feet): 10 Feet Assistive device: Rolling walker  (2 wheeled) Gait Pattern/deviations: Step-to pattern;Decreased step length - right;Decreased step length - left;Decreased stride length;Narrow base of support Gait velocity: decreased   General Gait Details: improved ability for ambulation this day. Pivoted to recliner initially, then stood again and walked 10 feet forward. limited by fatigue.   Stairs             Wheelchair Mobility    Modified Rankin (Stroke Patients Only)       Balance Overall balance assessment: Needs assistance;History of Falls Sitting-balance support: Feet supported Sitting balance-Leahy Scale: Good     Standing balance support: Bilateral upper extremity supported;During functional activity Standing balance-Leahy Scale: Fair Standing balance comment: requires assistance for balance, use of RW                            Cognition Arousal/Alertness: Awake/alert Behavior During Therapy: WFL for tasks assessed/performed Overall Cognitive Status: Within Functional Limits for tasks assessed                                        Exercises      General Comments        Pertinent Vitals/Pain Faces Pain Scale: Hurts a little bit Pain Location: Legs with spasms, left foot due to callous Pain Descriptors / Indicators: Spasm Pain Intervention(s): Monitored during session;Repositioned    Home Living                      Prior Function  PT Goals (current goals can now be found in the care plan section) Acute Rehab PT Goals Patient Stated Goal: to return home, remain independent for as long as possible, she is open to rehab if needed PT Goal Formulation: With patient Time For Goal Achievement: 08/09/20 Potential to Achieve Goals: Fair Progress towards PT goals: Progressing toward goals    Frequency    Min 3X/week      PT Plan Discharge plan needs to be updated    Co-evaluation              AM-PAC PT "6 Clicks" Mobility   Outcome  Measure  Help needed turning from your back to your side while in a flat bed without using bedrails?: A Little Help needed moving from lying on your back to sitting on the side of a flat bed without using bedrails?: A Little Help needed moving to and from a bed to a chair (including a wheelchair)?: A Little Help needed standing up from a chair using your arms (e.g., wheelchair or bedside chair)?: A Little Help needed to walk in hospital room?: A Lot Help needed climbing 3-5 steps with a railing? : Total 6 Click Score: 15    End of Session Equipment Utilized During Treatment: Gait belt Activity Tolerance: Patient tolerated treatment well;Patient limited by fatigue Patient left: in chair;with chair alarm set;with call bell/phone within reach Nurse Communication: Mobility status PT Visit Diagnosis: Unsteadiness on feet (R26.81);Difficulty in walking, not elsewhere classified (R26.2);Other abnormalities of gait and mobility (R26.89);Repeated falls (R29.6)     Time: 0370-9643 PT Time Calculation (min) (ACUTE ONLY): 28 min  Charges:  $Gait Training: 8-22 mins $Therapeutic Activity: 8-22 mins                     Ayde Record, PT, GCS 08/03/20,12:30 PM

## 2020-08-03 NOTE — Progress Notes (Signed)
Patient is currently not on her home doses of spasticity meds (due to renal function initially impairing her ability to have Baclofen).   I have restarted, with her primary doctor's permission, the Baclofen 10 mg TID for her, home dose, and also already on Zanaflex. I am concerned that her spasticity is the main reason this patient is unable to walk her normal distances based on PT notes in the computer- and to fully evaluate her appropriately, she needs to be on home doses to KNOW if she needs inpt rehab/CIR or just med changes.   I spoke with Dr Florene Glen- he agrees with me to wait until back on normal Baclofen- we are happy to see her tomorrow, once her Baclofen is restarted- which I did as allowed.   Thank you

## 2020-08-03 NOTE — Progress Notes (Signed)
PROGRESS NOTE    Mary Villanueva  ZWC:[id number] DOB: 04-05-1956 DOA: 08/01/2020 PCP: [name], MD    Chief Complaint  Patient presents with  . Fall   Brief Narrative:  Mary Villanueva is Mary Villanueva 64 y.o. female with medical history significant of multiple sclerosis, hypertension, hyperlipidemia, depression/anxiety presents to emergency department with worsening bilateral leg weakness, painful muscle spasm and fall x2.  Patient tells me that she has noticed worsening bilateral leg weakness more on left leg associated with painful muscle spasm, and fell twice and has increasing gait difficulty.   Today when she was walking in her room felt like her legs gave out on her again and she was unable to get up due to pain and weakness. She denies headache, blurry vision, lightheadedness, dizziness, head trauma, loss of consciousness, seizures, chest pain, shortness of breath, palpitation, leg swelling, fever, chills, cough, congestion, nausea, vomiting, diarrhea, abdominal pain, sleep changes. She has chronic urinary incontinence due to overactive bladder however denies stool incontinence.  No history of smoking, alcohol, listed drug use. Uses walker for ambulation. She is vaccinated against COVID-19.  ED Course: Upon arrival to ED: Patient's vital signs stable, initial labs including CBC, UA: Negative, BMP shows mild AKI. COVID-19 pending. CT head/cervical spine showed changes consistent with the given clinical history of MS. No acute abnormality. Multilevel degenerative changes. MRI of lumbar spine shows no evidence of demyelinating disease in the visualized lower spinal cord. Multiple degenerative changes of lumbar spine. EDP consulted neurology who recommended IV steroids for 3 days. [facility] consulted for admission for MS exacerbation.   Assessment & Plan:   Principal Problem:   Multiple sclerosis exacerbation (HCC) Active Problems:   Hypercholesteremia   Hypertension    AKI (acute kidney injury) ([place])   Hypothyroidism  Multiple sclerosis exacerbation: -Patient presented with bilateral lower extremity weakness and painful muscle spasm. History of MS. - CT head with changes c/w MS, no acute abnormality.  Multilevel degenerative change in C spine. - MR L spine without evidence of demyelinating dz in visualized lower spinal cord - UA bland, negative COVID 19 testing - neurology recommeding 1 g solumedrol x 3 days -Patient was seen by neurology - she's s/p 3 days of solumedrol - resuming baclofen.  Will continue zanaflex.  Needs outpatient neurology follow up.  Continued therapy. -Check B12 (wnl), folate (wnl), TSH level (wnl). -Consult PT -On fall precautions  Hyperglycemia  Steroid Induced Hyperglycemia: 2/2 steroids, continue SSI for now, a1c 5.6  Hypertension: Blood pressure is stable -Hold valsartan due to AKI.  Hyperlipidemia: Continue statin  Overactive bladder: Continue oxybutynin  Hypothyroidism: Check TSH -Continue levothyroxine  Depression/anxiety: Continue Wellbutrin, Prozac  AKI - baseline creatinine appears to be <1 - 1.2 on presentation, continue to monitor - improved  2.3 cm hypodense nodule in L lobe of thyroid:  Needs nonemergent thyroid US  DVT prophylaxis: lovenox Code Status: full  Family Communication: none at bedside Disposition:   Status is: Inpatient  Remains inpatient appropriate because:Inpatient level of care appropriate due to severity of illness   Dispo: The patient is from: Home              Anticipated d/c is to: Home              Anticipated d/c date is: 1 day              Patient currently is not medically stable to d/c. Consultants:   neurology  Procedures  none  Antimicrobials:  Anti-infectives (From admission, onward)   None     Subjective: Still has spasticity   Objective: Vitals:   08/03/20 0401 08/03/20 0829 08/03/20 1240 08/03/20 1531  BP: (!) 123/53 (!) 124/59 132/72  133/62  Pulse: 70 81 80 78  Resp: 18 16 18 20   Temp: 98.8 F (37.1 C) 98.5 F (36.9 C) 99 F (37.2 C) 98.7 F (37.1 C)  TempSrc: Oral Oral Oral Oral  SpO2: 99% 98% 100% 100%  Weight:      Height:        Intake/Output Summary (Last 24 hours) at 08/03/2020 1654 Last data filed at 08/03/2020 1242 Gross per 24 hour  Intake 540 ml  Output 800 ml  Net -260 ml   Filed Weights   08/01/20 1254  Weight: 66 kg    Examination:  General: No acute distress. Cardiovascular: Heart sounds show Anquanette Bahner regular rate, and rhythm. Lungs: Clear to auscultation bilaterally Abdomen: Soft, nontender, nondistended  Neurological: Alert and oriented 3. Moves all extremities 4.  L>RLE weakness.  Spasticity. Cranial nerves II through XII grossly intact. Skin: Warm and dry. No rashes or lesions. Extremities: No clubbing or cyanosis. No edema   Data Reviewed: I have personally reviewed following labs and imaging studies  CBC: Recent Labs  Lab 08/01/20 1338 08/01/20 1920 08/02/20 0333 08/03/20 0434  WBC 5.6 5.0 4.6 10.5  NEUTROABS 4.0  --   --  9.7*  HGB 11.2* 11.1* 11.5* 10.9*  HCT 34.7* 34.2* 34.9* 32.6*  MCV 96.7 96.6 95.4 95.0  PLT 215 201 207 355    Basic Metabolic Panel: Recent Labs  Lab 08/01/20 1338 08/01/20 1920 08/02/20 0333 08/03/20 0434  NA 141  --  138 139  K 3.9  --  3.7 4.1  CL 107  --  105 107  CO2 26  --  25 24  GLUCOSE 87  --  220* 155*  BUN 23  --  19 26*  CREATININE 1.20* 0.99 1.10* 0.93  CALCIUM 9.0  --  8.9 8.8*  MG  --  1.9  --  2.0  PHOS  --  3.1  --  2.7    GFR: Estimated Creatinine Clearance: 53.2 mL/min (by C-G formula based on SCr of 0.93 mg/dL).  Liver Function Tests: Recent Labs  Lab 08/02/20 0333 08/03/20 0434  AST 28 27  ALT 29 24  ALKPHOS 68 62  BILITOT 0.7 0.6  PROT 5.8* 5.9*  ALBUMIN 3.2* 3.1*    CBG: Recent Labs  Lab 08/02/20 1620 08/02/20 2111 08/03/20 0654 08/03/20 1251 08/03/20 1535  GLUCAP 169* 186* 139* 164* 147*      Recent Results (from the past 240 hour(s))  SARS Coronavirus 2 by RT PCR (hospital order, performed in [facility] hospital lab) Nasopharyngeal Nasopharyngeal Swab     Status: None   Collection Time: 08/01/20  6:45 PM   Specimen: Nasopharyngeal Swab  Result Value Ref Range Status   SARS Coronavirus 2 NEGATIVE NEGATIVE Final    Comment: (NOTE) SARS-CoV-2 target nucleic acids are NOT DETECTED.  The SARS-CoV-2 RNA is generally detectable in upper and lower respiratory specimens during the acute phase of infection. The lowest concentration of SARS-CoV-2 viral copies this assay can detect is 250 copies / mL. Ziyan Schoon negative result does not preclude SARS-CoV-2 infection and should not be used as the sole basis for treatment or other patient management decisions.  Arneshia Ade negative result may occur with improper specimen collection / handling, submission of specimen other than nasopharyngeal swab,  presence of viral mutation(s) within the areas targeted by this assay, and inadequate number of viral copies (<250 copies / mL). Nevia Henkin negative result must be combined with clinical observations, patient history, and epidemiological information.  Fact Sheet for Patients:   StrictlyIdeas.no  Fact Sheet for Healthcare Providers: BankingDealers.co.za  This test is not yet approved or  cleared by the [place] FDA and has been authorized for detection and/or diagnosis of SARS-CoV-2 by FDA under an Emergency Use Authorization (EUA).  This EUA will remain in effect (meaning this test can be used) for the duration of the COVID-19 declaration under Section 564(b)(1) of the Act, 21 U.S.C. section 360bbb-3(b)(1), unless the authorization is terminated or revoked sooner.  Performed at [facility], [place] [address], [place], [place] [address]          Radiology Studies: No results found.      Scheduled Meds: . baclofen  10 mg Oral TID  .  buPROPion  150 mg Oral Daily  . enoxaparin (LOVENOX) injection  40 mg Subcutaneous Q24H  . FLUoxetine  40 mg Oral Daily  . insulin aspart  0-5 Units Subcutaneous QHS  . insulin aspart  0-9 Units Subcutaneous TID WC  . levothyroxine  25 mcg Oral QAC breakfast  . oxybutynin  10 mg Oral QHS  . pantoprazole  40 mg Oral Daily  . simvastatin  20 mg Oral QPM  . tiZANidine  4 mg Oral QHS  . vitamin B-12  500 mcg Oral Daily   Continuous Infusions:    LOS: 2 days    Time spent: over 30 min    Fayrene Helper, MD [facility]   To contact the attending provider between 7A-7P or the covering provider during after hours 7P-7A, please log into the web site [email or web address] and access using universal [place] password for that web site. If you do not have the password, please call the hospital operator.  08/03/2020, 4:54 PM

## 2020-08-03 NOTE — Progress Notes (Signed)
NEUROLOGY PROGRESS NOTE  Subjective: Patient states that steroids did not seem to help.  Exam: Vitals:   08/03/20 0401 08/03/20 0829  BP: (!) 123/53 (!) 124/59  Pulse: 70 81  Resp: 18 16  Temp: 98.8 F (37.1 C) 98.5 F (36.9 C)  SpO2: 99% 98%   Neuro:  Mental Status: Alert, oriented, thought content appropriate.  Speech fluent without evidence of aphasia.  Able to follow 3 step commands without difficulty. Cranial Nerves: II:  Visual fields grossly normal,  III,IV, VI: ptosis not present, extra-ocular motions intact bilaterally pupils equal, round, reactive to light and accommodation V,VII: smile symmetric, facial light touch sensation normal bilaterally VIII: hearing normal bilaterally Motor: All extremities continue to show 5/5 strength however, she does have significant spasticity of bilateral lower extremities with knee flexion left greater than right. Sensory: Pinprick and light touch intact throughout, bilaterally Deep Tendon Reflexes: 2+ and symmetric throughout upper extremities with brisk 2+ lower extremities and cross adduction    Medications:  Scheduled: . buPROPion  150 mg Oral Daily  . enoxaparin (LOVENOX) injection  40 mg Subcutaneous Q24H  . FLUoxetine  40 mg Oral Daily  . insulin aspart  0-5 Units Subcutaneous QHS  . insulin aspart  0-9 Units Subcutaneous TID WC  . levothyroxine  25 mcg Oral QAC breakfast  . methocarbamol  500 mg Oral TID  . oxybutynin  10 mg Oral QHS  . pantoprazole  40 mg Oral Daily  . simvastatin  20 mg Oral QPM  . tiZANidine  4 mg Oral QHS  . vitamin B-12  500 mcg Oral Daily    Pertinent Labs/Diagnostics: B12 and TSH within normal limits     Etta Quill PA-C [facility] [phone]  Assessment:  This is a 64 year old female who has known multiple sclerosis located in both the brain and also cervical spine.  Patient initially was seen by Dr. Erling Cruz however now is seen by Dr. Krista Blue of [facility].   Over the last few days patient has noticed a significant increase in her spasticity of bilateral lower extremities and also decreased strength.  Due to having fallen twice secondary to the above patient has come to the hospital for further evaluation.  At this point she is on no immune modulating medications however she is on baclofen 10 mg 3 times daily along with Zanaflex 4 mg at bedtime.  She has had Botox injections in the past however this initially was helpful however later she felt did not give any resolution to the spasms.  She does state that she has had hospitalization for high-dose steroids in the past but does not recall the last time she has had them.  Given the increase in bilateral lower extremity spasms and weakness will treat as a MS exacerbation as the patients with gait disturbance do benefit from steroids.  Patient as above did state that she had no improvement with steroids. MRI of the brain or C- and T-spine, has been largely stable over years and less likely to reveal useful info, hence no need to repeat.  Recommended CT head and C-spine to r/o fracture/traumatic changes. No acute changes.  At this point patient has had 3 doses of Solu-Medrol with no improvement.  Impression: -MS exacerbation causing increased spasticity and also weakness in the lower extremities resulting in 2 falls. Recommendations: -Continue baclofen and Zanaflex at current dose -Follow-up with Dr. Krista Blue her primary neurologist -PT/OT on a regular basis -Possibly outpatient rehab/discussed with Dr. Florene Glen   08/03/2020,  10:26 AM

## 2020-08-04 DIAGNOSIS — T380X5A Adverse effect of glucocorticoids and synthetic analogues, initial encounter: Secondary | ICD-10-CM

## 2020-08-04 DIAGNOSIS — G35 Multiple sclerosis: Principal | ICD-10-CM

## 2020-08-04 LAB — COMPREHENSIVE METABOLIC PANEL
ALT: 26 U/L (ref 0–44)
AST: 22 U/L (ref 15–41)
Albumin: 3.3 g/dL — ABNORMAL LOW (ref 3.5–5.0)
Alkaline Phosphatase: 64 U/L (ref 38–126)
Anion gap: 10 (ref 5–15)
BUN: 30 mg/dL — ABNORMAL HIGH (ref 8–23)
CO2: 24 mmol/L (ref 22–32)
Calcium: 8.8 mg/dL — ABNORMAL LOW (ref 8.9–10.3)
Chloride: 108 mmol/L (ref 98–111)
Creatinine, Ser: 1.34 mg/dL — ABNORMAL HIGH (ref 0.44–1.00)
GFR calc Af Amer: 48 mL/min — ABNORMAL LOW (ref >60)
GFR calc non Af Amer: 42 mL/min — ABNORMAL LOW (ref >60)
Glucose, Bld: 128 mg/dL — ABNORMAL HIGH (ref 70–99)
Potassium: 3.9 mmol/L (ref 3.5–5.1)
Sodium: 142 mmol/L (ref 135–145)
Total Bilirubin: 0.5 mg/dL (ref 0.3–1.2)
Total Protein: 6 g/dL — ABNORMAL LOW (ref 6.5–8.1)

## 2020-08-04 LAB — GLUCOSE, CAPILLARY
Glucose-Capillary: 126 mg/dL — ABNORMAL HIGH (ref 70–99)
Glucose-Capillary: 127 mg/dL — ABNORMAL HIGH (ref 70–99)
Glucose-Capillary: 116 mg/dL — ABNORMAL HIGH (ref 70–99)
Glucose-Capillary: 138 mg/dL — ABNORMAL HIGH (ref 70–99)

## 2020-08-04 LAB — CBC WITH DIFFERENTIAL/PLATELET
Abs Immature Granulocytes: 0.1 10*3/uL — ABNORMAL HIGH (ref 0.00–0.07)
Basophils Absolute: 0 10*3/uL (ref 0.0–0.1)
Basophils Relative: 0 %
Eosinophils Absolute: 0 10*3/uL (ref 0.0–0.5)
Eosinophils Relative: 0 %
HCT: 33.5 % — ABNORMAL LOW (ref 36.0–46.0)
Hemoglobin: 11.1 g/dL — ABNORMAL LOW (ref 12.0–15.0)
Immature Granulocytes: 1 %
Lymphocytes Relative: 4 %
Lymphs Abs: 0.3 10*3/uL — ABNORMAL LOW (ref 0.7–4.0)
MCH: 31.2 pg (ref 26.0–34.0)
MCHC: 33.1 g/dL (ref 30.0–36.0)
MCV: 94.1 fL (ref 80.0–100.0)
Monocytes Absolute: 0.2 10*3/uL (ref 0.1–1.0)
Monocytes Relative: 2 %
Neutro Abs: 8.3 10*3/uL — ABNORMAL HIGH (ref 1.7–7.7)
Neutrophils Relative %: 93 %
Platelets: 202 10*3/uL (ref 150–400)
RBC: 3.56 MIL/uL — ABNORMAL LOW (ref 3.87–5.11)
RDW: 13.1 % (ref 11.5–15.5)
WBC: 9 10*3/uL (ref 4.0–10.5)
nRBC: 0 % (ref 0.0–0.2)

## 2020-08-04 LAB — MAGNESIUM: Magnesium: 2.1 mg/dL (ref 1.7–2.4)

## 2020-08-04 LAB — PHOSPHORUS: Phosphorus: 2.7 mg/dL (ref 2.5–4.6)

## 2020-08-04 MED ORDER — LACTATED RINGERS IV SOLN: INTRAVENOUS | Status: AC

## 2020-08-04 NOTE — TOC Progression Note (Signed)
Transition of Care [facility]) - Progression Note    Patient Details  Name: Mary Villanueva MRN: [id number] Date of Birth: 1956/01/01  Transition of Care [facility]) CM/SW Contact  Pollie Friar, RN Phone Number: 08/04/2020, 10:43 AM  Clinical Narrative:    Recommendations have changed to Inpt rehab. CM met with the patient yesterday and she is in agreement. [facility] will not have bed availability for her. CM updated her and she was in agreement with having her information sent to [facility]. CM has sent over her information and await a call back that they will being insurance.  TOC following.   Expected Discharge Plan: IP Rehab Facility Barriers to Discharge: Continued Medical Work up  Expected Discharge Plan and Services Expected Discharge Plan: [place]   Discharge Planning Services: CM Consult Post Acute Care Choice: IP Rehab Living arrangements for the past 2 months: [place]: PT [place]: [place] Date [place]: 08/02/20   Representative spoke with at [place]: [place]) Interventions    Readmission Risk Interventions No flowsheet data found.

## 2020-08-04 NOTE — Progress Notes (Signed)
Inpatient Rehabilitation Admissions Coordinator  CIR bed not available through the weekend for this patient. I will follow up on Monday if patient remains in house.  Danne Baxter, RN, MSN Rehab Admissions Coordinator [phone] 08/04/2020 2:28 PM

## 2020-08-04 NOTE — Progress Notes (Signed)
Inpatient Rehabilitation Admissions Coordinator  I met with patient at bedside to tell her that [facility] CIR bed not readily available. [place] [employer] authorization begun. We will sign off at this time.  Danne Baxter, RN, MSN Rehab Admissions Coordinator [phone] 08/04/2020 3:47 PM

## 2020-08-04 NOTE — Evaluation (Signed)
Occupational Therapy Evaluation Patient Details Name: Mary Villanueva MRN: [id number] DOB: 1956/07/08 Today's Date: 08/04/2020    History of Present Illness Jazmaine Fuelling Buresh is a 64 y.o. female with medical history significant of multiple sclerosis, hypertension, hyperlipidemia, depression/anxiety presents to emergency department with worsening bilateral leg weakness, painful muscle spasm and fall x2.   Clinical Impression   PTA patient modified independent using rollator for mobility and ADLs, IADLs. Admitted for above and limited by problem list below, including impaired balance, generalized weakness (LB), increased tone/spasms in B LEs with increased pain, and decreased activity tolerance.  Patient currently requires mod assist for transfers (min assist from elevated rollator with increased UE support), LB ADLs with min-mod assist and UB ADls with setup assist seated.  Completed functional mobility to/from sink today with min assist, but when turning to sit on rollator at sink requires increased time, verbal cues and mod assist with 1 LOB when briefly having only 1 UE support.  Based on performance today, patient will best benefit from CIR level rehab to optimize independence, safety with ADLs, IADLs and mobility prior to returning home.  Will follow acutely.     Follow Up Recommendations  CIR;Supervision/Assistance - 24 hour    Equipment Recommendations  Other (comment) (TBD at next venue of care)    Recommendations for Other Services       Precautions / Restrictions Precautions Precautions: Fall Restrictions Weight Bearing Restrictions: No      Mobility Bed Mobility Overal bed mobility: Modified Independent             General bed mobility comments: HOB elevated and use of bed rails, increased time and effort   Transfers Overall transfer level: Needs assistance Equipment used: 4-wheeled walker Transfers: Sit to/from Stand Sit to Stand: Mod assist          General transfer comment: mod assist to power up from EOB, min assist from seated on rollator; good hand placement but increased effort and steadying assist required for full ascend     Balance Overall balance assessment: Needs assistance;History of Falls Sitting-balance support: No upper extremity supported;Feet supported Sitting balance-Leahy Scale: Fair Sitting balance - Comments: mild posterior lean with dynamic LB ADL engagement requiring min guard for safety  Postural control: Posterior lean Standing balance support: Bilateral upper extremity supported;During functional activity Standing balance-Leahy Scale: Fair Standing balance comment: relies on UE support and external support, turning to sit on RW briefly with 1 UE support and requires min-mod assist                            ADL either performed or assessed with clinical judgement   ADL Overall ADL's : Needs assistance/impaired     Grooming: Set up;Sitting Grooming Details (indicate cue type and reason): at sink  Upper Body Bathing: Set up;Sitting   Lower Body Bathing: Moderate assistance;Sit to/from stand   Upper Body Dressing : Set up;Sitting   Lower Body Dressing: Moderate assistance;Sit to/from stand Lower Body Dressing Details (indicate cue type and reason): pt able to figure 4 LEs with effort, min guard dynamically sitting and would need assist to don socks; mod assist to fully power up and relies on UE support in standing  Toilet Transfer: Moderate assistance;Ambulation (rollator ) Toilet Transfer Details (indicate cue type and reason): simulated in room using rollator          Functional mobility during ADLs: Minimal assistance;Moderate assistance (rollator, mod assist with LB  turning towards sink to sit ) General ADL Comments: pt limited by weakness, impaired balance and decreased activity tolerance     Vision Baseline Vision/History: Wears glasses Wears Glasses: Distance only Patient Visual  Report: Blurring of vision Vision Assessment?: No apparent visual deficits Additional Comments: brief assessment completed, able to scan and track without trouble     Perception     Praxis      Pertinent Vitals/Pain Pain Assessment: Faces Faces Pain Scale: Hurts a little bit Pain Location: Legs with spasms, left foot due to callous Pain Descriptors / Indicators: Spasm Pain Intervention(s): Monitored during session;Repositioned;Limited activity within patient's tolerance     Hand Dominance Right   Extremity/Trunk Assessment Upper Extremity Assessment Upper Extremity Assessment: Overall WFL for tasks assessed   Lower Extremity Assessment Lower Extremity Assessment: Defer to PT evaluation   Cervical / Trunk Assessment Cervical / Trunk Assessment: Normal   Communication Communication Communication: No difficulties   Cognition Arousal/Alertness: Awake/alert Behavior During Therapy: WFL for tasks assessed/performed Overall Cognitive Status: Within Functional Limits for tasks assessed                                     General Comments  highly motivated and eager to return home once stronger after rehab     Exercises     Shoulder Instructions      Home Living Family/patient expects to be discharged to:: Private residence Living Arrangements: Alone Available Help at Discharge: Family;Friend(s);Available PRN/intermittently Type of Home: House Home Access: Level entry     Home Layout: One level     Bathroom Shower/Tub: Occupational psychologist: Standard     Home Equipment: Environmental consultant - 4 wheels;Wheelchair - Brewing technologist;Toilet riser;Grab bars - tub/shower;Electric scooter (suction grabbars in shower )          Prior Functioning/Environment Level of Independence: Independent with assistive device(s)        Comments: uses rollator with moblity at home, independent IADLs, +driving         OT Problem List: Decreased  strength;Decreased activity tolerance;Impaired balance (sitting and/or standing);Decreased coordination;Decreased knowledge of use of DME or AE;Decreased knowledge of precautions;Pain      OT Treatment/Interventions: Self-care/ADL training;DME and/or AE instruction;Neuromuscular education;Therapeutic exercise;Therapeutic activities;Patient/family education;Balance training    OT Goals(Current goals can be found in the care plan section) Acute Rehab OT Goals Patient Stated Goal: to get to rehab and then get home  OT Goal Formulation: With patient Time For Goal Achievement: 08/18/20 Potential to Achieve Goals: Good  OT Frequency: Min 2X/week   Barriers to D/C:            Co-evaluation              AM-PAC OT "6 Clicks" Daily Activity     Outcome Measure Help from another person eating meals?: None Help from another person taking care of personal grooming?: A Little Help from another person toileting, which includes using toliet, bedpan, or urinal?: A Lot Help from another person bathing (including washing, rinsing, drying)?: A Lot Help from another person to put on and taking off regular upper body clothing?: A Little Help from another person to put on and taking off regular lower body clothing?: A Lot 6 Click Score: 16   End of Session Equipment Utilized During Treatment: Gait belt;Other (comment) (rollator) Nurse Communication: Mobility status  Activity Tolerance: Patient tolerated treatment well Patient left: in chair;with  call bell/phone within reach  OT Visit Diagnosis: Other abnormalities of gait and mobility (R26.89);Muscle weakness (generalized) (M62.81);Pain;Other symptoms and signs involving the nervous system (R29.898);History of falling (Z91.81) Pain - Right/Left:  (bil) Pain - part of body: Ankle and joints of foot;Leg                Time: 0930-1003 OT Time Calculation (min): 33 min Charges:  OT General Charges $OT Visit: 1 Visit OT Evaluation $OT Eval  Moderate Complexity: 1 Mod OT Treatments $Self Care/Home Management : 8-22 mins  Jolaine Artist, OT Acute Rehabilitation Services Pager [phone] Office [phone]    Delight Stare 08/04/2020, 10:26 AM

## 2020-08-04 NOTE — Progress Notes (Signed)
PT Cancellation Note  Patient Details Name: Mary Villanueva MRN: [id number] DOB: 08-19-1956   Cancelled Treatment:    Reason Eval/Treat Not Completed: Patient declined, no reason specified. Patient reports she walked with OT and with nursing and would like to rest now. Will return over the weekend if time allows.      Deuce Paternoster 08/04/2020, 2:42 PM

## 2020-08-04 NOTE — Progress Notes (Signed)
PROGRESS NOTE    Mary Villanueva  ZOX:[id number] DOB: 06-Aug-1956 DOA: 08/01/2020 PCP: [name], MD    Chief Complaint  Patient presents with  . Fall   Brief Narrative:  Mary Villanueva is Mary Villanueva 64 y.o. female with medical history significant of multiple sclerosis, hypertension, hyperlipidemia, depression/anxiety presents to emergency department with worsening bilateral leg weakness, painful muscle spasm and fall x2.  Patient tells me that she has noticed worsening bilateral leg weakness more on left leg associated with painful muscle spasm, and fell twice and has increasing gait difficulty.   She was admitted for MS exacerbation, she's now completed 3 days of IV steroids.  Plan at this point is for inpatient rehab pending insurance approval.    Assessment & Plan:   Principal Problem:   Multiple sclerosis exacerbation ([place]) Active Problems:   Hypercholesteremia   Hypertension   AKI (acute kidney injury) ([place])   Hypothyroidism   Steroid-induced hyperglycemia  Multiple sclerosis exacerbation: -Patient presented with bilateral lower extremity weakness and painful muscle spasm. History of MS. - CT head with changes c/w MS, no acute abnormality.  Multilevel degenerative change in C spine. - MR L spine without evidence of demyelinating dz in visualized lower spinal cord - UA bland, negative COVID 19 testing - neurology recommeding 1 g solumedrol x 3 days -Patient was seen by neurology - she's s/p 3 days of solumedrol - resuming baclofen (attention to renal function, some AKI today, follow with IVF, may need to adjust dose).  Will continue zanaflex.  Needs outpatient neurology follow up.  Continued therapy.   - Plan for inpatient rehab -Check B12 (wnl), folate (wnl), TSH level (wnl). -Consult PT -On fall precautions  Hyperglycemia  Steroid Induced Hyperglycemia: 2/2 steroids, will d/c SSI at this time  Hypertension: Blood pressure is stable -Hold valsartan due to  AKI.  Hyperlipidemia: Continue statin  Overactive bladder: Continue oxybutynin  Hypothyroidism: Check TSH -Continue levothyroxine  Depression/anxiety: Continue Wellbutrin, Prozac  AKI - baseline creatinine appears to be <1 - creatinine bumped to 1.34, follow with IVF  2.3 cm hypodense nodule in L lobe of thyroid:  Needs nonemergent thyroid US  DVT prophylaxis: lovenox Code Status: full  Family Communication: none at bedside Disposition:   Status is: Inpatient  Remains inpatient appropriate because:Inpatient level of care appropriate due to severity of illness   Dispo: The patient is from: Home              Anticipated d/c is to: Home              Anticipated d/c date is: 1 day              Patient currently is not medically stable to d/c. Consultants:   neurology  Procedures  none  Antimicrobials:  Anti-infectives (From admission, onward)   None     Subjective: No new complaints Continued spasticity  Objective: Vitals:   08/04/20 0428 08/04/20 0826 08/04/20 1212 08/04/20 1609  BP: 128/66 135/60 132/62 126/61  Pulse: 68 69 67 66  Resp: 18 20 16 16   Temp: 98 F (36.7 C) 98.5 F (36.9 C) 98.3 F (36.8 C) 97.9 F (36.6 C)  TempSrc: Oral Oral Oral Oral  SpO2: 97% 98% 100% 99%  Weight:      Height:        Intake/Output Summary (Last 24 hours) at 08/04/2020 1930 Last data filed at 08/04/2020 1823 Gross per 24 hour  Intake 1143.69 ml  Output 600 ml  Net  543.69 ml   Filed Weights   08/01/20 1254  Weight: 66 kg    Examination:  General: No acute distress. Cardiovascular: Heart sounds show Mary Villanueva regular rate, and rhythm.  Lungs: Clear to auscultation bilaterally Abdomen: Soft, nontender, nondistended Neurological: Alert and oriented 3. Moves all extremities 4, L>R LE weakness and spasticity. Cranial nerves II through XII grossly intact. Skin: Warm and dry. No rashes or lesions. Extremities: No clubbing or cyanosis. No edema.     Data  Reviewed: I have personally reviewed following labs and imaging studies  CBC: Recent Labs  Lab 08/01/20 1338 08/01/20 1920 08/02/20 0333 08/03/20 0434 08/04/20 0156  WBC 5.6 5.0 4.6 10.5 9.0  NEUTROABS 4.0  --   --  9.7* 8.3*  HGB 11.2* 11.1* 11.5* 10.9* 11.1*  HCT 34.7* 34.2* 34.9* 32.6* 33.5*  MCV 96.7 96.6 95.4 95.0 94.1  PLT 215 201 207 201 086    Basic Metabolic Panel: Recent Labs  Lab 08/01/20 1338 08/01/20 1920 08/02/20 0333 08/03/20 0434 08/04/20 0156  NA 141  --  138 139 142  K 3.9  --  3.7 4.1 3.9  CL 107  --  105 107 108  CO2 26  --  25 24 24   GLUCOSE 87  --  220* 155* 128*  BUN 23  --  19 26* 30*  CREATININE 1.20* 0.99 1.10* 0.93 1.34*  CALCIUM 9.0  --  8.9 8.8* 8.8*  MG  --  1.9  --  2.0 2.1  PHOS  --  3.1  --  2.7 2.7    GFR: Estimated Creatinine Clearance: 36.9 mL/min (Mary Villanueva) (by C-G formula based on SCr of 1.34 mg/dL (H)).  Liver Function Tests: Recent Labs  Lab 08/02/20 0333 08/03/20 0434 08/04/20 0156  AST 28 27 22   ALT 29 24 26   ALKPHOS 68 62 64  BILITOT 0.7 0.6 0.5  PROT 5.8* 5.9* 6.0*  ALBUMIN 3.2* 3.1* 3.3*    CBG: Recent Labs  Lab 08/03/20 1535 08/03/20 2105 08/04/20 0624 08/04/20 1214 08/04/20 1609  GLUCAP 147* 250* 126* 127* 116*     Recent Results (from the past 240 hour(s))  SARS Coronavirus 2 by RT PCR (hospital order, performed in [facility] hospital lab) Nasopharyngeal Nasopharyngeal Swab     Status: None   Collection Time: 08/01/20  6:45 PM   Specimen: Nasopharyngeal Swab  Result Value Ref Range Status   SARS Coronavirus 2 NEGATIVE NEGATIVE Final    Comment: (NOTE) SARS-CoV-2 target nucleic acids are NOT DETECTED.  The SARS-CoV-2 RNA is generally detectable in upper and lower respiratory specimens during the acute phase of infection. The lowest concentration of SARS-CoV-2 viral copies this assay can detect is 250 copies / mL. Mary Villanueva negative result does not preclude SARS-CoV-2 infection and should not be used as  the sole basis for treatment or other patient management decisions.  Mary Villanueva negative result may occur with improper specimen collection / handling, submission of specimen other than nasopharyngeal swab, presence of viral mutation(s) within the areas targeted by this assay, and inadequate number of viral copies (<250 copies / mL). Katlen Seyer negative result must be combined with clinical observations, patient history, and epidemiological information.  Fact Sheet for Patients:   StrictlyIdeas.no  Fact Sheet for Healthcare Providers: BankingDealers.co.za  This test is not yet approved or  cleared by the [place] FDA and has been authorized for detection and/or diagnosis of SARS-CoV-2 by FDA under an Emergency Use Authorization (EUA).  This EUA will remain in effect (  meaning this test can be used) for the duration of the COVID-19 declaration under Section 564(b)(1) of the Act, 21 U.S.C. section 360bbb-3(b)(1), unless the authorization is terminated or revoked sooner.  Performed at [facility], [place] [address] [place], [place] [address]          Radiology Studies: No results found.      Scheduled Meds: . baclofen  10 mg Oral TID  . buPROPion  150 mg Oral Daily  . enoxaparin (LOVENOX) injection  40 mg Subcutaneous Q24H  . FLUoxetine  40 mg Oral Daily  . insulin aspart  0-5 Units Subcutaneous QHS  . insulin aspart  0-9 Units Subcutaneous TID WC  . levothyroxine  25 mcg Oral QAC breakfast  . oxybutynin  10 mg Oral QHS  . pantoprazole  40 mg Oral Daily  . simvastatin  20 mg Oral QPM  . tiZANidine  4 mg Oral QHS  . vitamin B-12  500 mcg Oral Daily   Continuous Infusions: . lactated ringers 100 mL/hr at 08/04/20 1922     LOS: 3 days    Time spent: over 62 min    Fayrene Helper, MD [facility]   To contact the attending provider between 7A-7P or the covering provider during after hours 7P-7A, please log into  the web site [email or web address] and access using universal [place] password for that web site. If you do not have the password, please call the hospital operator.  08/04/2020, 7:30 PM

## 2020-08-05 DIAGNOSIS — E7800 Pure hypercholesterolemia, unspecified: Secondary | ICD-10-CM

## 2020-08-05 LAB — COMPREHENSIVE METABOLIC PANEL
ALT: 30 U/L (ref 0–44)
AST: 23 U/L (ref 15–41)
Albumin: 3 g/dL — ABNORMAL LOW (ref 3.5–5.0)
Alkaline Phosphatase: 59 U/L (ref 38–126)
Anion gap: 7 (ref 5–15)
BUN: 33 mg/dL — ABNORMAL HIGH (ref 8–23)
CO2: 27 mmol/L (ref 22–32)
Calcium: 8.9 mg/dL (ref 8.9–10.3)
Chloride: 108 mmol/L (ref 98–111)
Creatinine, Ser: 1.15 mg/dL — ABNORMAL HIGH (ref 0.44–1.00)
GFR calc Af Amer: 58 mL/min — ABNORMAL LOW (ref >60)
GFR calc non Af Amer: 50 mL/min — ABNORMAL LOW (ref >60)
Glucose, Bld: 111 mg/dL — ABNORMAL HIGH (ref 70–99)
Potassium: 3.9 mmol/L (ref 3.5–5.1)
Sodium: 142 mmol/L (ref 135–145)
Total Bilirubin: 0.3 mg/dL (ref 0.3–1.2)
Total Protein: 5.5 g/dL — ABNORMAL LOW (ref 6.5–8.1)

## 2020-08-05 LAB — CBC WITH DIFFERENTIAL/PLATELET
Abs Immature Granulocytes: 0.05 10*3/uL (ref 0.00–0.07)
Basophils Absolute: 0 10*3/uL (ref 0.0–0.1)
Basophils Relative: 0 %
Eosinophils Absolute: 0 10*3/uL (ref 0.0–0.5)
Eosinophils Relative: 0 %
HCT: 33 % — ABNORMAL LOW (ref 36.0–46.0)
Hemoglobin: 10.7 g/dL — ABNORMAL LOW (ref 12.0–15.0)
Immature Granulocytes: 1 %
Lymphocytes Relative: 11 %
Lymphs Abs: 1 10*3/uL (ref 0.7–4.0)
MCH: 31 pg (ref 26.0–34.0)
MCHC: 32.4 g/dL (ref 30.0–36.0)
MCV: 95.7 fL (ref 80.0–100.0)
Monocytes Absolute: 1 10*3/uL (ref 0.1–1.0)
Monocytes Relative: 11 %
Neutro Abs: 7 10*3/uL (ref 1.7–7.7)
Neutrophils Relative %: 77 %
Platelets: 190 10*3/uL (ref 150–400)
RBC: 3.45 MIL/uL — ABNORMAL LOW (ref 3.87–5.11)
RDW: 13.3 % (ref 11.5–15.5)
WBC: 8.9 10*3/uL (ref 4.0–10.5)
nRBC: 0 % (ref 0.0–0.2)

## 2020-08-05 LAB — PHOSPHORUS: Phosphorus: 2.8 mg/dL (ref 2.5–4.6)

## 2020-08-05 LAB — GLUCOSE, CAPILLARY
Glucose-Capillary: 91 mg/dL (ref 70–99)
Glucose-Capillary: 100 mg/dL — ABNORMAL HIGH (ref 70–99)
Glucose-Capillary: 93 mg/dL (ref 70–99)
Glucose-Capillary: 103 mg/dL — ABNORMAL HIGH (ref 70–99)

## 2020-08-05 LAB — MAGNESIUM: Magnesium: 2.1 mg/dL (ref 1.7–2.4)

## 2020-08-05 NOTE — Progress Notes (Signed)
PROGRESS NOTE    Mary Villanueva  OFB:[id number] DOB: [birth year] DOA: 08/01/2020 PCP: [name], MD    Brief Narrative:  64 y.o.femalewith medical history significant ofmultiple sclerosis, hypertension, hyperlipidemia, depression/anxiety presents to emergency department with worsening bilateral leg weakness, painful muscle spasm and fall x2.  Patient tells me that she has noticed worsening bilateral leg weakness more on left leg associated with painful muscle spasm, and fell twice and has increasing gait difficulty  Assessment & Plan:   Principal Problem:   Multiple sclerosis exacerbation ([facility]) Active Problems:   Hypercholesteremia   Hypertension   AKI (acute kidney injury) ([place])   Hypothyroidism   Steroid-induced hyperglycemia   Multiple sclerosis exacerbation: -Patient presented with bilateral lower extremity weakness and painful muscle spasm. History of MS. - CT head with changes c/w MS, no acute abnormality.  Multilevel degenerative change in C spine. - MR L spine without evidence of demyelinating dz in visualized lower spinal cord - UA bland, negative COVID 19 testing - neurology had been following, now completed 1 g solumedrol x 3 days -Patient was seen by neurology - she's s/p 3 days of solumedrol - resuming baclofen (attention to renal function, some AKI today, follow with IVF, may need to adjust dose).  Will continue zanaflex.  Needs outpatient neurology follow up.  Continued therapy.   - Plan for inpatient rehab -Check B12 (wnl), folate (wnl), TSH level (wnl). -Consult PT -On fall precautions  Hyperglycemia  Steroid Induced Hyperglycemia: 2/2 steroids, plan to d/c SSI at this time  Hypertension: Blood pressure is stable -Holding valsartan due to AKI.  Hyperlipidemia: Continue statin  Overactive bladder: Continue oxybutynin  Hypothyroidism: Check TSH -Continue levothyroxine  Depression/anxiety: Continued on Wellbutrin, Prozac  AKI -  baseline creatinine appears to be <1 - creatinine improving with IVF  DVT prophylaxis: Lovenox subq Code Status: Full Family Communication: Pt in room, family not at bedside  Status is: Inpatient  Remains inpatient appropriate because:Unsafe d/c plan   Dispo: The patient is from: Home              Anticipated d/c is to: CIR              Anticipated d/c date is: 2 days              Patient currently is medically stable to d/c. Just waiting on disposition to inpatient rehab   Consultants:   Neurology  Procedures:     Antimicrobials: Anti-infectives (From admission, onward)   None       Subjective: Without complaints this AM  Objective: Vitals:   08/04/20 2349 08/05/20 0329 08/05/20 0742 08/05/20 1229  BP: (!) 116/57 138/74 (!) 129/57 123/60  Pulse: (!) 58 (!) 59 (!) 57 60  Resp: 18 18 14 18   Temp: 98 F (36.7 C) 98 F (36.7 C) 98 F (36.7 C) 97.9 F (36.6 C)  TempSrc: Oral  Oral Oral  SpO2: 99% 99% 100% 100%  Weight:      Height:        Intake/Output Summary (Last 24 hours) at 08/05/2020 1518 Last data filed at 08/04/2020 1823 Gross per 24 hour  Intake 546.73 ml  Output 600 ml  Net -53.27 ml   Filed Weights   08/01/20 1254  Weight: 66 kg    Examination:  General exam: Appears calm and comfortable  Respiratory system: Clear to auscultation. Respiratory effort normal. Cardiovascular system: S1 & S2 heard, Regular Gastrointestinal system: Abdomen is nondistended, soft and nontender. No organomegaly  or masses felt. Normal bowel sounds heard. Central nervous system: Alert and oriented. No focal neurological deficits. Extremities: Symmetric 5 x 5 power. Skin: No rashes, lesions Psychiatry: Judgement and insight appear normal. Mood & affect appropriate.   Data Reviewed: I have personally reviewed following labs and imaging studies  CBC: Recent Labs  Lab 08/01/20 1338 08/01/20 1338 08/01/20 1920 08/02/20 0333 08/03/20 0434 08/04/20 0156  08/05/20 0321  WBC 5.6   < > 5.0 4.6 10.5 9.0 8.9  NEUTROABS 4.0  --   --   --  9.7* 8.3* 7.0  HGB 11.2*   < > 11.1* 11.5* 10.9* 11.1* 10.7*  HCT 34.7*   < > 34.2* 34.9* 32.6* 33.5* 33.0*  MCV 96.7   < > 96.6 95.4 95.0 94.1 95.7  PLT 215   < > 201 207 201 202 190   < > = values in this interval not displayed.   Basic Metabolic Panel: Recent Labs  Lab 08/01/20 1338 08/01/20 1338 08/01/20 1920 08/02/20 0333 08/03/20 0434 08/04/20 0156 08/05/20 0321  NA 141  --   --  138 139 142 142  K 3.9  --   --  3.7 4.1 3.9 3.9  CL 107  --   --  105 107 108 108  CO2 26  --   --  25 24 24 27   GLUCOSE 87  --   --  220* 155* 128* 111*  BUN 23  --   --  19 26* 30* 33*  CREATININE 1.20*   < > 0.99 1.10* 0.93 1.34* 1.15*  CALCIUM 9.0  --   --  8.9 8.8* 8.8* 8.9  MG  --   --  1.9  --  2.0 2.1 2.1  PHOS  --   --  3.1  --  2.7 2.7 2.8   < > = values in this interval not displayed.   GFR: Estimated Creatinine Clearance: 43 mL/min (A) (by C-G formula based on SCr of 1.15 mg/dL (H)). Liver Function Tests: Recent Labs  Lab 08/02/20 0333 08/03/20 0434 08/04/20 0156 08/05/20 0321  AST 28 27 22 23   ALT 29 24 26 30   ALKPHOS 68 62 64 59  BILITOT 0.7 0.6 0.5 0.3  PROT 5.8* 5.9* 6.0* 5.5*  ALBUMIN 3.2* 3.1* 3.3* 3.0*   No results for input(s): LIPASE, AMYLASE in the last 168 hours. No results for input(s): AMMONIA in the last 168 hours. Coagulation Profile: No results for input(s): INR, PROTIME in the last 168 hours. Cardiac Enzymes: No results for input(s): CKTOTAL, CKMB, CKMBINDEX, TROPONINI in the last 168 hours. BNP (last 3 results) No results for input(s): PROBNP in the last 8760 hours. HbA1C: No results for input(s): HGBA1C in the last 72 hours. CBG: Recent Labs  Lab 08/04/20 1214 08/04/20 1609 08/04/20 2113 08/05/20 0615 08/05/20 1116  GLUCAP 127* 116* 138* 91 100*   Lipid Profile: No results for input(s): CHOL, HDL, LDLCALC, TRIG, CHOLHDL, LDLDIRECT in the last 72  hours. Thyroid Function Tests: No results for input(s): TSH, T4TOTAL, FREET4, T3FREE, THYROIDAB in the last 72 hours. Anemia Panel: No results for input(s): VITAMINB12, FOLATE, FERRITIN, TIBC, IRON, RETICCTPCT in the last 72 hours. Sepsis Labs: No results for input(s): PROCALCITON, LATICACIDVEN in the last 168 hours.  Recent Results (from the past 240 hour(s))  SARS Coronavirus 2 by RT PCR (hospital order, performed in [facility] hospital lab) Nasopharyngeal Nasopharyngeal Swab     Status: None   Collection Time: 08/01/20  6:45 PM  Specimen: Nasopharyngeal Swab  Result Value Ref Range Status   SARS Coronavirus 2 NEGATIVE NEGATIVE Final    Comment: (NOTE) SARS-CoV-2 target nucleic acids are NOT DETECTED.  The SARS-CoV-2 RNA is generally detectable in upper and lower respiratory specimens during the acute phase of infection. The lowest concentration of SARS-CoV-2 viral copies this assay can detect is 250 copies / mL. A negative result does not preclude SARS-CoV-2 infection and should not be used as the sole basis for treatment or other patient management decisions.  A negative result may occur with improper specimen collection / handling, submission of specimen other than nasopharyngeal swab, presence of viral mutation(s) within the areas targeted by this assay, and inadequate number of viral copies (<250 copies / mL). A negative result must be combined with clinical observations, patient history, and epidemiological information.  Fact Sheet for Patients:   StrictlyIdeas.no  Fact Sheet for Healthcare Providers: BankingDealers.co.za  This test is not yet approved or  cleared by the [place] FDA and has been authorized for detection and/or diagnosis of SARS-CoV-2 by FDA under an Emergency Use Authorization (EUA).  This EUA will remain in effect (meaning this test can be used) for the duration of the COVID-19 declaration under  Section 564(b)(1) of the Act, 21 U.S.C. section 360bbb-3(b)(1), unless the authorization is terminated or revoked sooner.  Performed at [facility], [place] [address] [place], [place] [address]      Radiology Studies: No results found.  Scheduled Meds: . baclofen  10 mg Oral TID  . buPROPion  150 mg Oral Daily  . enoxaparin (LOVENOX) injection  40 mg Subcutaneous Q24H  . FLUoxetine  40 mg Oral Daily  . levothyroxine  25 mcg Oral QAC breakfast  . oxybutynin  10 mg Oral QHS  . pantoprazole  40 mg Oral Daily  . simvastatin  20 mg Oral QPM  . tiZANidine  4 mg Oral QHS  . vitamin B-12  500 mcg Oral Daily   Continuous Infusions:   LOS: 4 days   Marylu Lund, MD [facility] Pager On Amion  If 7PM-7AM, please contact night-coverage 08/05/2020, 3:18 PM

## 2020-08-06 LAB — GLUCOSE, CAPILLARY: Glucose-Capillary: 83 mg/dL (ref 70–99)

## 2020-08-06 NOTE — Progress Notes (Signed)
Physical Therapy Treatment Patient Details Name: Mary Villanueva MRN: [id number] DOB: 1956-09-16 Today's Date: 08/06/2020    History of Present Illness Mary Villanueva is a 64 y.o. female with medical history significant of multiple sclerosis, hypertension, hyperlipidemia, depression/anxiety presents to emergency department with worsening bilateral leg weakness, painful muscle spasm and fall x2.    PT Comments    Continuing work on functional mobility and activity tolerance;  Notable difficulty with LLE advancement/stepping, resulting in instability in stance and loss of balance; Mod assist to correct, and able to get to the recliner with assist; Continue to recommend comprehensive inpatient rehab (CIR) level rehab for post-acute therapy needs.    Follow Up Recommendations  CIR     Equipment Recommendations  None recommended by PT    Recommendations for Other Services       Precautions / Restrictions Precautions Precautions: Fall    Mobility  Bed Mobility Overal bed mobility: Modified Independent             General bed mobility comments: HOB elevated and use of bed rails, increased time and effort   Transfers Overall transfer level: Needs assistance Equipment used: 4-wheeled walker Transfers: Sit to/from Stand Sit to Stand: Mod assist         General transfer comment: mod assist to power up from EOB, min assist from seated on rollator; good hand placement but increased effort and steadying assist required for full ascend   Ambulation/Gait Ambulation/Gait assistance: Mod assist Gait Distance (Feet):  (pivot steps bed to recliner) Assistive device: Rolling walker (2 wheeled) Gait Pattern/deviations: Step-to pattern;Decreased step length - right;Decreased step length - left;Decreased stride length;Narrow base of support     General Gait Details: Difficulty with L foot stepping/placement, leading to instability and loss of balance, needing mod assist to  steady   Stairs             Wheelchair Mobility    Modified Rankin (Stroke Patients Only)       Balance     Sitting balance-Leahy Scale: Fair       Standing balance-Leahy Scale: Poor Standing balance comment: more difficulty with balance today                            Cognition Arousal/Alertness: Awake/alert Behavior During Therapy: WFL for tasks assessed/performed Overall Cognitive Status: Within Functional Limits for tasks assessed                                        Exercises      General Comments General comments (skin integrity, edema, etc.): hgihgly motivated and eager to return home once stronger after rehab      Pertinent Vitals/Pain Pain Assessment: Faces Faces Pain Scale: No hurt Pain Location: left foot due to callous Pain Intervention(s): Monitored during session    Home Living                      Prior Function            PT Goals (current goals can now be found in the care plan section) Acute Rehab PT Goals Patient Stated Goal: to get to rehab and then get home  PT Goal Formulation: With patient Time For Goal Achievement: 08/09/20 Potential to Achieve Goals: Fair Progress towards PT goals: Progressing toward goals (slowly)  Frequency    Min 3X/week      PT Plan Current plan remains appropriate    Co-evaluation              AM-PAC PT "6 Clicks" Mobility   Outcome Measure  Help needed turning from your back to your side while in a flat bed without using bedrails?: A Little Help needed moving from lying on your back to sitting on the side of a flat bed without using bedrails?: A Little Help needed moving to and from a bed to a chair (including a wheelchair)?: A Little Help needed standing up from a chair using your arms (e.g., wheelchair or bedside chair)?: A Little Help needed to walk in hospital room?: A Lot Help needed climbing 3-5 steps with a railing? : Total 6 Click  Score: 15    End of Session Equipment Utilized During Treatment: Gait belt Activity Tolerance: Patient tolerated treatment well;Patient limited by fatigue Patient left: in chair;with chair alarm set;with call bell/phone within reach Nurse Communication: Mobility status PT Visit Diagnosis: Unsteadiness on feet (R26.81);Difficulty in walking, not elsewhere classified (R26.2);Other abnormalities of gait and mobility (R26.89);Repeated falls (R29.6)     Time: 8270-7867 PT Time Calculation (min) (ACUTE ONLY): 25 min  Charges:  $Gait Training: 8-22 mins $Therapeutic Activity: 8-22 mins                     Roney Marion, PT  Acute Rehabilitation Services Pager [phone] Office [place] 08/06/2020, 7:50 PM

## 2020-08-06 NOTE — Progress Notes (Signed)
PROGRESS NOTE    Mary Villanueva  DGU:[id number] DOB: [birth year] DOA: 08/01/2020 PCP: [name], MD    Brief Narrative:  64 y.o.femalewith medical history significant ofmultiple sclerosis, hypertension, hyperlipidemia, depression/anxiety presents to emergency department with worsening bilateral leg weakness, painful muscle spasm and fall x2.  Patient tells me that she has noticed worsening bilateral leg weakness more on left leg associated with painful muscle spasm, and fell twice and has increasing gait difficulty  Assessment & Plan:   Principal Problem:   Multiple sclerosis exacerbation (HCC) Active Problems:   Hypercholesteremia   Hypertension   AKI (acute kidney injury) ([place])   Hypothyroidism   Steroid-induced hyperglycemia   Multiple sclerosis exacerbation: -Patient presented with bilateral lower extremity weakness and painful muscle spasm. History of MS. - CT head with changes c/w MS, no acute abnormality.  Multilevel degenerative change in C spine. - MR L spine without evidence of demyelinating dz in visualized lower spinal cord - UA bland, negative COVID 19 testing - neurology had been following, now completed 1 g solumedrol x 3 days -Patient was seen by neurology - she's s/p 3 days of solumedrol - resuming baclofen (attention to renal function, some AKI today, follow with IVF, may need to adjust dose).  Will continue zanaflex.  Needs outpatient neurology follow up.  Continued therapy.   - Plan for inpatient rehab -Check B12 (wnl), folate (wnl), TSH level (wnl). -Therapy recs for CIR. Plan for CIR in [place] noted -Continue with fall precautions  Hyperglycemia  Steroid Induced Hyperglycemia: 2/2 steroids, stopped SSI coverage  Hypertension: Blood pressure is stable -Held valsartan due to AKI.  Hyperlipidemia: Continue statin  Overactive bladder: Continue oxybutynin as tolerated  Hypothyroidism: TSH, 1.461 -Continue  levothyroxine  Depression/anxiety: Continued on Wellbutrin, Prozac as tolerated  AKI - baseline creatinine appears to be <1 - creatinine improving with IVF  DVT prophylaxis: Lovenox subq Code Status: Full Family Communication: Pt in room, family not at bedside  Status is: Inpatient  Remains inpatient appropriate because:Unsafe d/c plan   Dispo: The patient is from: Home              Anticipated d/c is to: CIR              Anticipated d/c date is: 2 days              Patient currently is medically stable to d/c. Just waiting on disposition to inpatient rehab   Consultants:   Neurology  Procedures:     Antimicrobials: Anti-infectives (From admission, onward)   None      Subjective: No complaints this AM  Objective: Vitals:   08/06/20 0322 08/06/20 0824 08/06/20 1204 08/06/20 1606  BP: 116/70 (!) 123/55 (!) 122/49 (!) 124/54  Pulse: 63 73 81 76  Resp: 19 18 16 18   Temp: 98.1 F (36.7 C) 98 F (36.7 C) 98.1 F (36.7 C) 98.1 F (36.7 C)  TempSrc: Oral Oral Oral Oral  SpO2: 99% 100% 100% 99%  Weight:      Height:        Intake/Output Summary (Last 24 hours) at 08/06/2020 1622 Last data filed at 08/06/2020 1607 Gross per 24 hour  Intake 300 ml  Output 600 ml  Net -300 ml   Filed Weights   08/01/20 1254  Weight: 66 kg    Examination: General exam: Awake, laying in bed, in nad Respiratory system: Normal respiratory effort, no wheezing Cardiovascular system: regular rate, s1, s2 Gastrointestinal system: Soft, nondistended, positive  BS Central nervous system: CN2-12 grossly intact, strength intact Extremities: Perfused, no clubbing Skin: Normal skin turgor, no notable skin lesions seen Psychiatry: Mood normal // no visual hallucinations   Data Reviewed: I have personally reviewed following labs and imaging studies  CBC: Recent Labs  Lab 08/01/20 1338 08/01/20 1338 08/01/20 1920 08/02/20 0333 08/03/20 0434 08/04/20 0156 08/05/20 0321  WBC  5.6   < > 5.0 4.6 10.5 9.0 8.9  NEUTROABS 4.0  --   --   --  9.7* 8.3* 7.0  HGB 11.2*   < > 11.1* 11.5* 10.9* 11.1* 10.7*  HCT 34.7*   < > 34.2* 34.9* 32.6* 33.5* 33.0*  MCV 96.7   < > 96.6 95.4 95.0 94.1 95.7  PLT 215   < > 201 207 201 202 190   < > = values in this interval not displayed.   Basic Metabolic Panel: Recent Labs  Lab 08/01/20 1338 08/01/20 1338 08/01/20 1920 08/02/20 0333 08/03/20 0434 08/04/20 0156 08/05/20 0321  NA 141  --   --  138 139 142 142  K 3.9  --   --  3.7 4.1 3.9 3.9  CL 107  --   --  105 107 108 108  CO2 26  --   --  25 24 24 27   GLUCOSE 87  --   --  220* 155* 128* 111*  BUN 23  --   --  19 26* 30* 33*  CREATININE 1.20*   < > 0.99 1.10* 0.93 1.34* 1.15*  CALCIUM 9.0  --   --  8.9 8.8* 8.8* 8.9  MG  --   --  1.9  --  2.0 2.1 2.1  PHOS  --   --  3.1  --  2.7 2.7 2.8   < > = values in this interval not displayed.   GFR: Estimated Creatinine Clearance: 43 mL/min (A) (by C-G formula based on SCr of 1.15 mg/dL (H)). Liver Function Tests: Recent Labs  Lab 08/02/20 0333 08/03/20 0434 08/04/20 0156 08/05/20 0321  AST 28 27 22 23   ALT 29 24 26 30   ALKPHOS 68 62 64 59  BILITOT 0.7 0.6 0.5 0.3  PROT 5.8* 5.9* 6.0* 5.5*  ALBUMIN 3.2* 3.1* 3.3* 3.0*   No results for input(s): LIPASE, AMYLASE in the last 168 hours. No results for input(s): AMMONIA in the last 168 hours. Coagulation Profile: No results for input(s): INR, PROTIME in the last 168 hours. Cardiac Enzymes: No results for input(s): CKTOTAL, CKMB, CKMBINDEX, TROPONINI in the last 168 hours. BNP (last 3 results) No results for input(s): PROBNP in the last 8760 hours. HbA1C: No results for input(s): HGBA1C in the last 72 hours. CBG: Recent Labs  Lab 08/05/20 0615 08/05/20 1116 08/05/20 1520 08/05/20 2124 08/06/20 0620  GLUCAP 91 100* 93 103* 83   Lipid Profile: No results for input(s): CHOL, HDL, LDLCALC, TRIG, CHOLHDL, LDLDIRECT in the last 72 hours. Thyroid Function Tests: No  results for input(s): TSH, T4TOTAL, FREET4, T3FREE, THYROIDAB in the last 72 hours. Anemia Panel: No results for input(s): VITAMINB12, FOLATE, FERRITIN, TIBC, IRON, RETICCTPCT in the last 72 hours. Sepsis Labs: No results for input(s): PROCALCITON, LATICACIDVEN in the last 168 hours.  Recent Results (from the past 240 hour(s))  SARS Coronavirus 2 by RT PCR (hospital order, performed in [facility] hospital lab) Nasopharyngeal Nasopharyngeal Swab     Status: None   Collection Time: 08/01/20  6:45 PM   Specimen: Nasopharyngeal Swab  Result Value Ref  Range Status   SARS Coronavirus 2 NEGATIVE NEGATIVE Final    Comment: (NOTE) SARS-CoV-2 target nucleic acids are NOT DETECTED.  The SARS-CoV-2 RNA is generally detectable in upper and lower respiratory specimens during the acute phase of infection. The lowest concentration of SARS-CoV-2 viral copies this assay can detect is 250 copies / mL. A negative result does not preclude SARS-CoV-2 infection and should not be used as the sole basis for treatment or other patient management decisions.  A negative result may occur with improper specimen collection / handling, submission of specimen other than nasopharyngeal swab, presence of viral mutation(s) within the areas targeted by this assay, and inadequate number of viral copies (<250 copies / mL). A negative result must be combined with clinical observations, patient history, and epidemiological information.  Fact Sheet for Patients:   StrictlyIdeas.no  Fact Sheet for Healthcare Providers: BankingDealers.co.za  This test is not yet approved or  cleared by the [place] FDA and has been authorized for detection and/or diagnosis of SARS-CoV-2 by FDA under an Emergency Use Authorization (EUA).  This EUA will remain in effect (meaning this test can be used) for the duration of the COVID-19 declaration under Section 564(b)(1) of the Act, 21  U.S.C. section 360bbb-3(b)(1), unless the authorization is terminated or revoked sooner.  Performed at [facility], [place] [address] [place], [place] [address]      Radiology Studies: No results found.  Scheduled Meds: . baclofen  10 mg Oral TID  . buPROPion  150 mg Oral Daily  . enoxaparin (LOVENOX) injection  40 mg Subcutaneous Q24H  . FLUoxetine  40 mg Oral Daily  . levothyroxine  25 mcg Oral QAC breakfast  . oxybutynin  10 mg Oral QHS  . pantoprazole  40 mg Oral Daily  . simvastatin  20 mg Oral QPM  . tiZANidine  4 mg Oral QHS  . vitamin B-12  500 mcg Oral Daily   Continuous Infusions:   LOS: 5 days   Marylu Lund, MD [facility] Pager On Amion  If 7PM-7AM, please contact night-coverage 08/06/2020, 4:22 PM

## 2020-08-07 DIAGNOSIS — N179 Acute kidney failure, unspecified: Secondary | ICD-10-CM

## 2020-08-07 LAB — SARS CORONAVIRUS 2 BY RT PCR (HOSPITAL ORDER, PERFORMED IN ~~LOC~~ HOSPITAL LAB): SARS Coronavirus 2: NEGATIVE

## 2020-08-07 MED ORDER — BACLOFEN 10 MG PO TABS
10.0000 mg | ORAL_TABLET | Freq: Once | ORAL | Status: AC
  Administered 2020-08-07: 10 mg via ORAL
  Filled 2020-08-07: qty 1

## 2020-08-07 NOTE — TOC Transition Note (Addendum)
Transition of Care [facility]) - CM/SW Discharge Note   Patient Details  Name: Mary Villanueva MRN: [id number] Date of Birth: 03-19-1956  Transition of [facility] CM/SW Contact:  Pollie Friar, RN Phone Number: 08/07/2020, 2:11 PM   Clinical Narrative:    Pt discharging to IR at [facility] today. Information faxed to the facility.  PTAR to provided transportation.  Room: 408 Number for report: [phone]   Final next level of care: IP Rehab Facility Barriers to Discharge: No Barriers Identified   Patient Goals and CMS Choice   CMS Medicare.gov Compare Post Acute Care list provided to:: Patient Choice offered to / list presented to : Patient  Discharge Placement                Patient to be transferred to facility by: [place] Name of family member notified: Patient aware and will update her family Patient and family notified of of transfer: 08/07/20  Discharge Plan and Services   Discharge Planning Services: CM Consult Post Acute Care Choice: IP Rehab                    HH Arranged: PT [place] Agency: [place] Date [place]: 08/02/20   Representative spoke with at [place]: [place] ([place]) Interventions     Readmission Risk Interventions No flowsheet data found.

## 2020-08-07 NOTE — Progress Notes (Signed)
Occupational Therapy Treatment Patient Details Name: Mary Villanueva MRN: [id number] DOB: 1956/05/27 Today's Date: 08/07/2020    History of present illness Mary Villanueva is a 64 y.o. female with medical history significant of multiple sclerosis, hypertension, hyperlipidemia, depression/anxiety presents to emergency department with worsening bilateral leg weakness, painful muscle spasm and fall x2.   OT comments  This 64 yo female admitted with above seen today to focus on sit<>stand, standing, and LBADLs. Pt very willing to try anything asked of her. She states she feels weaker today overall, but kept on trying. Pt able to stand with Mod A, sept in place with Mod A, and cross her legs to get to her feet for doffing and donning socks.She will continue to benefit from acute OT with follow up on CIR.  Follow Up Recommendations  CIR;Supervision/Assistance - 24 hour    Equipment Recommendations  Other (comment) (TBD next venue)       Precautions / Restrictions Precautions Precautions: Fall Precaution Comments: She has spasms in LLE (leg draws up) Restrictions Weight Bearing Restrictions: No       Mobility Bed Mobility               General bed mobility comments: Pt up in recliner upon arrival  Transfers Overall transfer level: Needs assistance Equipment used: 4-wheeled walker Transfers: Sit to/from Stand Sit to Stand: Mod assist Stand pivot transfers: Mod assist       General transfer comment: Pt can initiate sit>stand but when she goes to change hand position from arms of recliner to rollator she looses he balance. Also with stand>sit ~1/2 way down her legs just give on her (she can use her arms to help lower herself down when she focuses on this)    Balance Overall balance assessment: Needs assistance Sitting-balance support: No upper extremity supported;Feet supported Sitting balance-Leahy Scale: Good Sitting balance - Comments: in recliner sitting away from  back of it Postural control: Posterior lean Standing balance support: Bilateral upper extremity supported;During functional activity Standing balance-Leahy Scale: Poor Standing balance comment: Needed A of rollator and therapist (pt reports this is weaker than she has been)                           ADL either performed or assessed with clinical judgement   ADL Overall ADL's : Needs assistance/impaired                       Lower Body Dressing Details (indicate cue type and reason): pt can cross one leg over the other with the A of her UEs (more difficult with LLE than RLE) to doff and donn socks. She can get other LB clothing started over legs and pulled up over knees but she cannot stand up alone nor let go of rollator while she would pull up clothing.   Toilet Transfer Details (indicate cue type and reason): simulated by stepping in place with rollator. Intermittently her LLE would draw up in to flexion synergy while standing                 Vision Baseline Vision/History: Wears glasses Wears Glasses: At all times Patient Visual Report: Blurring of vision            Cognition Arousal/Alertness: Awake/alert Behavior During Therapy: WFL for tasks assessed/performed Overall Cognitive Status: Within Functional Limits for tasks assessed  Exercises Other Exercises Other Exercises: Worked on sit to stand 4 times and then while standing had her do stepping in place.           Pertinent Vitals/ Pain       Pain Assessment: Faces Faces Pain Scale: Hurts even more Pain Location: With spams of LLE Pain Descriptors / Indicators: Grimacing;Moaning Pain Intervention(s): Limited activity within patient's tolerance;Monitored during session;Repositioned         Frequency  Min 2X/week        Progress Toward Goals  OT Goals(current goals can now be found in the care plan section)  Progress  towards OT goals: Progressing toward goals  Acute Rehab OT Goals Patient Stated Goal: to get to rehab and then get home  OT Goal Formulation: With patient Time For Goal Achievement: 08/18/20 Potential to Achieve Goals: Good  Plan Discharge plan remains appropriate       AM-PAC OT "6 Clicks" Daily Activity     Outcome Measure   Help from another person eating meals?: None Help from another person taking care of personal grooming?: A Little Help from another person toileting, which includes using toliet, bedpan, or urinal?: A Lot Help from another person bathing (including washing, rinsing, drying)?: A Little Help from another person to put on and taking off regular upper body clothing?: A Little Help from another person to put on and taking off regular lower body clothing?: A Lot 6 Click Score: 17    End of Session Equipment Utilized During Treatment: Gait belt (rollator)  OT Visit Diagnosis: Other abnormalities of gait and mobility (R26.89);Muscle weakness (generalized) (M62.81);Pain;Other symptoms and signs involving the nervous system (R29.898);History of falling (Z91.81) Pain - Right/Left: Left Pain - part of body: Leg (spasms)   Activity Tolerance Patient tolerated treatment well   Patient Left in chair;with call bell/phone within reach;with chair alarm set   Nurse Communication          Time: 4315-4008 OT Time Calculation (min): 22 min  Charges: OT General Charges $OT Visit: 1 Visit OT Treatments $Self Care/Home Management : 8-22 mins  [place] [address], OTR/L Acute [employer] Pager [phone] Office [phone]      Almon Register 08/07/2020, 1:10 PM

## 2020-08-07 NOTE — Discharge Summary (Signed)
Physician Discharge Summary  Mary Villanueva IOX:[id number] DOB: 09-14-1956 DOA: 08/01/2020  PCP: [name], MD  Admit date: 08/01/2020 Discharge date: 08/07/2020  Admitted From: Home Disposition:  CIR  Recommendations for Outpatient Follow-up:  1. Follow up with PCP in 1-2 weeks 2. Please note home valsartan 320mg  is now on hold secondary to soft/low normal BP. Would resume as bp tolerates down the road  Discharge Condition:Stable CODE STATUS:Full Diet recommendation: Heart healthy   Brief/Interim Summary: 64 y.o.femalewith medical history significant ofmultiple sclerosis, hypertension, hyperlipidemia, depression/anxiety presents to emergency department with worsening bilateral leg weakness, painful muscle spasm and fall x2.  Patient tells me that she has noticed worsening bilateral leg weakness more on left leg associated with painful muscle spasm, and fell twice and has increasing gait difficulty  Discharge Diagnoses:  Principal Problem:   Multiple sclerosis exacerbation ([place]) Active Problems:   Hypercholesteremia   Hypertension   AKI (acute kidney injury) ([place])   Hypothyroidism   Steroid-induced hyperglycemia   Multiple sclerosis exacerbation: -Patient presented with bilateral lower extremity weakness and painful muscle spasm. History of MS. - CT head with changes c/w MS, no acute abnormality. Multilevel degenerative change in C spine. - MR L spine without evidence of demyelinating dz in visualized lower spinal cord - UA bland, negative COVID 19 testing - neurology had been following, now completed 1 g solumedrol x 3 days -Patient was seen by neurology - she's s/p 3 days of solumedrol - resumed baclofen. Will continue zanaflex. Needs outpatient neurology follow up. Continued therapy.  - Plan for inpatient rehab -Check B12 (wnl), folate (wnl), TSH level (wnl). -Continue with fall precautions  Hyperglycemia  Steroid Induced Hyperglycemia: 2/2  steroids,stopped SSI coverage  Hypertension: Blood pressure is stable -Held valsartan initially due to AKI. Would cont to hold given well controlled BP  Hyperlipidemia: Continue statin  Overactive bladder: Continue oxybutynin as tolerated  Hypothyroidism: TSH, 1.461 -Continue levothyroxine  Depression/anxiety: Continued on Wellbutrin, Prozac as tolerated  AKI - baseline creatinine appears to be <1 -creatinine improved with IVF   Discharge Instructions   Allergies as of 08/07/2020   No Known Allergies     Medication List    STOP taking these medications   Diovan 320 MG tablet Generic drug: valsartan     TAKE these medications   baclofen 10 MG tablet Commonly known as: LIORESAL Take 1 tablet (10 mg total) by mouth 3 (three) times daily.   Biotin 10 MG Caps Take 10 mg by mouth every morning.   buPROPion 150 MG 12 hr tablet Commonly known as: WELLBUTRIN SR Take 150 mg by mouth daily.   calcium carbonate 600 MG Tabs tablet Commonly known as: OS-CAL Take 600 mg by mouth 2 (two) times daily with a meal.   CENTRUM SILVER 50+WOMEN PO Take 1 capsule by mouth daily.   Fish Oil 1200 MG Caps Take 1,200 mg by mouth daily.   FLUoxetine 40 MG capsule Commonly known as: PROZAC TAKE 1 CAPSULE BY MOUTH 1  TIME DAILY WITH 20 MG  CAPSULE What changed: See the new instructions.   oxybutynin 10 MG 24 hr tablet Commonly known as: DITROPAN-XL Take 10 mg by mouth at bedtime.   PROBIOTIC DAILY PO Take 1 capsule by mouth daily.   simvastatin 20 MG tablet Commonly known as: ZOCOR Take 20 mg by mouth every evening.   Synthroid 25 MCG tablet Generic drug: levothyroxine Take 25 mcg by mouth daily before breakfast.   tiZANidine 4 MG tablet Commonly known as:  ZANAFLEX Take 1 tablet (4 mg total) by mouth at bedtime.   vitamin B-12 500 MCG tablet Commonly known as: CYANOCOBALAMIN Take 500 mcg by mouth daily.   Vitamin D-3 25 MCG (1000 UT) Caps Take 1,000 Units  by mouth 2 (two) times daily.       Follow-up Information    [facility] Follow up.   Specialty: Home Health Services Why: The home health agency will contact you for the first home visit. Contact information: [place] [address] [phone]              No Known Allergies  Consultations:  Neurology  Procedures/Studies: CT Head Wo Contrast  Result Date: 08/01/2020 CLINICAL DATA:  Recent fall with headaches and neck pain, initial encounter EXAM: CT HEAD WITHOUT CONTRAST CT CERVICAL SPINE WITHOUT CONTRAST TECHNIQUE: Multidetector CT imaging of the head and cervical spine was performed following the standard protocol without intravenous contrast. Multiplanar CT image reconstructions of the cervical spine were also generated. COMPARISON:  11/04/2018 MRI FINDINGS: CT HEAD FINDINGS Brain: No findings to suggest acute hemorrhage, acute infarction or space-occupying mass lesion are noted. There are geographic areas of decreased attenuation which correspond with that seen on recent MRI as well as with patient's given clinical history. Vascular: No hyperdense vessel or unexpected calcification. Skull: Normal. Negative for fracture or focal lesion. Sinuses/Orbits: No acute finding. Other: None. CT CERVICAL SPINE FINDINGS Alignment: Normal. Skull base and vertebrae: 7 cervical segments are well visualized. Vertebral body height is well maintained. No acute fracture or acute facet abnormality is noted. Mild facet hypertrophic changes are seen. Mild osteophytic changes are noted most prominent posteriorly at C3-4. Soft tissues and spinal canal: Surrounding soft tissue structures appear within normal limits with the exception of a 2.3 cm hypodense nodule within the left lobe of the thyroid. Upper chest: Visualized lung apices are unremarkable. Other: None IMPRESSION: CT of the head: Changes consistent with the given clinical history of MS. No acute abnormality  noted. CT of the cervical spine: Multilevel degenerative change. 2.3 cm hypodense nodule in the left lobe of the thyroid. Recommend nonemergent thyroid US(ref: J Am Coll Radiol. 2015 Feb;12(2): 143-50). Electronically Signed   By: Inez Catalina M.D.   On: 08/01/2020 15:04   CT Cervical Spine Wo Contrast  Result Date: 08/01/2020 CLINICAL DATA:  Recent fall with headaches and neck pain, initial encounter EXAM: CT HEAD WITHOUT CONTRAST CT CERVICAL SPINE WITHOUT CONTRAST TECHNIQUE: Multidetector CT imaging of the head and cervical spine was performed following the standard protocol without intravenous contrast. Multiplanar CT image reconstructions of the cervical spine were also generated. COMPARISON:  11/04/2018 MRI FINDINGS: CT HEAD FINDINGS Brain: No findings to suggest acute hemorrhage, acute infarction or space-occupying mass lesion are noted. There are geographic areas of decreased attenuation which correspond with that seen on recent MRI as well as with patient's given clinical history. Vascular: No hyperdense vessel or unexpected calcification. Skull: Normal. Negative for fracture or focal lesion. Sinuses/Orbits: No acute finding. Other: None. CT CERVICAL SPINE FINDINGS Alignment: Normal. Skull base and vertebrae: 7 cervical segments are well visualized. Vertebral body height is well maintained. No acute fracture or acute facet abnormality is noted. Mild facet hypertrophic changes are seen. Mild osteophytic changes are noted most prominent posteriorly at C3-4. Soft tissues and spinal canal: Surrounding soft tissue structures appear within normal limits with the exception of a 2.3 cm hypodense nodule within the left lobe of the thyroid. Upper chest: Visualized  lung apices are unremarkable. Other: None IMPRESSION: CT of the head: Changes consistent with the given clinical history of MS. No acute abnormality noted. CT of the cervical spine: Multilevel degenerative change. 2.3 cm hypodense nodule in the left lobe  of the thyroid. Recommend nonemergent thyroid US(ref: J Am Coll Radiol. 2015 Feb;12(2): 143-50). Electronically Signed   By: Inez Catalina M.D.   On: 08/01/2020 15:04   MR Lumbar Spine W Wo Contrast  Result Date: 08/01/2020 CLINICAL DATA:  Bilateral lower extremity pain and weakness. Unable to stand. History of multiple sclerosis. No injury. EXAM: MRI LUMBAR SPINE WITHOUT AND WITH CONTRAST TECHNIQUE: Multiplanar and multiecho pulse sequences of the lumbar spine were obtained without and with intravenous contrast. CONTRAST:  49mL GADAVIST GADOBUTROL 1 MMOL/ML IV SOLN COMPARISON:  CT abdomen pelvis dated June 16, 2018. FINDINGS: Segmentation:  Standard. Alignment:  Physiologic. Vertebrae:  No fracture, evidence of discitis, or bone lesion. Conus medullaris and cauda equina: Conus extends to the L2 level. Conus and cauda equina appear normal. No intradural enhancement. Paraspinal and other soft tissues: Negative. Disc levels: T12-L1:  Negative. L1-L2:  Negative. L2-L3: Minimal disc bulging and mild bilateral facet arthropathy with ligamentum flavum hypertrophy. Mild spinal canal stenosis. No neuroforaminal stenosis. L3-L4: Minimal disc bulging and mild bilateral facet arthropathy with ligamentum flavum hypertrophy. Mild spinal canal stenosis. No neuroforaminal stenosis. L4-L5: Negative disc. Mild bilateral facet arthropathy. No stenosis. L5-S1: Negative disc. Mild bilateral facet arthropathy. No stenosis. IMPRESSION: 1. No evidence of demyelinating disease in the visualized lower spinal cord. 2. Mild multilevel degenerative changes of the lumbar spine as described above. No high-grade stenosis or impingement. Electronically Signed   By: Titus Dubin M.D.   On: 08/01/2020 16:32     Subjective: Very eager to start therapy  Discharge Exam: Vitals:   08/07/20 1010 08/07/20 1119  BP: (!) 122/58 (!) 110/51  Pulse: 69 66  Resp:  16  Temp:  98.9 F (37.2 C)  SpO2:  100%   Vitals:   08/07/20 0331 08/07/20  0727 08/07/20 1010 08/07/20 1119  BP: 127/62 (!) 116/52 (!) 122/58 (!) 110/51  Pulse: 61 62 69 66  Resp: 18 14  16   Temp: 98.4 F (36.9 C) 98 F (36.7 C)  98.9 F (37.2 C)  TempSrc: Oral Oral  Oral  SpO2: 100% 100%  100%  Weight:      Height:        General: Pt is alert, awake, not in acute distress Cardiovascular: RRR, S1/S2 +, no rubs, no gallops Respiratory: CTA bilaterally, no wheezing, no rhonchi Abdominal: Soft, NT, ND, bowel sounds + Extremities: no edema, no cyanosis   The results of significant diagnostics from this hospitalization (including imaging, microbiology, ancillary and laboratory) are listed below for reference.     Microbiology: Recent Results (from the past 240 hour(s))  SARS Coronavirus 2 by RT PCR (hospital order, performed in [facility] hospital lab) Nasopharyngeal Nasopharyngeal Swab     Status: None   Collection Time: 08/01/20  6:45 PM   Specimen: Nasopharyngeal Swab  Result Value Ref Range Status   SARS Coronavirus 2 NEGATIVE NEGATIVE Final    Comment: (NOTE) SARS-CoV-2 target nucleic acids are NOT DETECTED.  The SARS-CoV-2 RNA is generally detectable in upper and lower respiratory specimens during the acute phase of infection. The lowest concentration of SARS-CoV-2 viral copies this assay can detect is 250 copies / mL. A negative result does not preclude SARS-CoV-2 infection and should not be used as the sole  basis for treatment or other patient management decisions.  A negative result may occur with improper specimen collection / handling, submission of specimen other than nasopharyngeal swab, presence of viral mutation(s) within the areas targeted by this assay, and inadequate number of viral copies (<250 copies / mL). A negative result must be combined with clinical observations, patient history, and epidemiological information.  Fact Sheet for Patients:   StrictlyIdeas.no  Fact Sheet for Healthcare  Providers: BankingDealers.co.za  This test is not yet approved or  cleared by the [place] FDA and has been authorized for detection and/or diagnosis of SARS-CoV-2 by FDA under an Emergency Use Authorization (EUA).  This EUA will remain in effect (meaning this test can be used) for the duration of the COVID-19 declaration under Section 564(b)(1) of the Act, 21 U.S.C. section 360bbb-3(b)(1), unless the authorization is terminated or revoked sooner.  Performed at [facility], [place] [address], [place], [place] [address]      Labs: BNP (last 3 results) No results for input(s): BNP in the last 8760 hours. Basic Metabolic Panel: Recent Labs  Lab 08/01/20 1338 08/01/20 1338 08/01/20 1920 08/02/20 0333 08/03/20 0434 08/04/20 0156 08/05/20 0321  NA 141  --   --  138 139 142 142  K 3.9  --   --  3.7 4.1 3.9 3.9  CL 107  --   --  105 107 108 108  CO2 26  --   --  25 24 24 27   GLUCOSE 87  --   --  220* 155* 128* 111*  BUN 23  --   --  19 26* 30* 33*  CREATININE 1.20*   < > 0.99 1.10* 0.93 1.34* 1.15*  CALCIUM 9.0  --   --  8.9 8.8* 8.8* 8.9  MG  --   --  1.9  --  2.0 2.1 2.1  PHOS  --   --  3.1  --  2.7 2.7 2.8   < > = values in this interval not displayed.   Liver Function Tests: Recent Labs  Lab 08/02/20 0333 08/03/20 0434 08/04/20 0156 08/05/20 0321  AST 28 27 22 23   ALT 29 24 26 30   ALKPHOS 68 62 64 59  BILITOT 0.7 0.6 0.5 0.3  PROT 5.8* 5.9* 6.0* 5.5*  ALBUMIN 3.2* 3.1* 3.3* 3.0*   No results for input(s): LIPASE, AMYLASE in the last 168 hours. No results for input(s): AMMONIA in the last 168 hours. CBC: Recent Labs  Lab 08/01/20 1338 08/01/20 1338 08/01/20 1920 08/02/20 0333 08/03/20 0434 08/04/20 0156 08/05/20 0321  WBC 5.6   < > 5.0 4.6 10.5 9.0 8.9  NEUTROABS 4.0  --   --   --  9.7* 8.3* 7.0  HGB 11.2*   < > 11.1* 11.5* 10.9* 11.1* 10.7*  HCT 34.7*   < > 34.2* 34.9* 32.6* 33.5* 33.0*  MCV 96.7   < > 96.6 95.4 95.0  94.1 95.7  PLT 215   < > 201 207 201 202 190   < > = values in this interval not displayed.   Cardiac Enzymes: No results for input(s): CKTOTAL, CKMB, CKMBINDEX, TROPONINI in the last 168 hours. BNP: Invalid input(s): POCBNP CBG: Recent Labs  Lab 08/05/20 0615 08/05/20 1116 08/05/20 1520 08/05/20 2124 08/06/20 0620  GLUCAP 91 100* 93 103* 83   D-Dimer No results for input(s): DDIMER in the last 72 hours. Hgb A1c No results for input(s): HGBA1C in the last 72 hours. Lipid Profile  No results for input(s): CHOL, HDL, LDLCALC, TRIG, CHOLHDL, LDLDIRECT in the last 72 hours. Thyroid function studies No results for input(s): TSH, T4TOTAL, T3FREE, THYROIDAB in the last 72 hours.  Invalid input(s): FREET3 Anemia work up No results for input(s): VITAMINB12, FOLATE, FERRITIN, TIBC, IRON, RETICCTPCT in the last 72 hours. Urinalysis    Component Value Date/Time   COLORURINE YELLOW 08/01/2020 1440   APPEARANCEUR CLEAR 08/01/2020 1440   APPEARANCEUR Clear 10/14/2018 0843   LABSPEC 1.014 08/01/2020 1440   PHURINE 5.0 08/01/2020 1440   GLUCOSEU NEGATIVE 08/01/2020 1440   HGBUR NEGATIVE 08/01/2020 1440   BILIRUBINUR NEGATIVE 08/01/2020 1440   BILIRUBINUR Negative 10/14/2018 0843   KETONESUR NEGATIVE 08/01/2020 1440   PROTEINUR NEGATIVE 08/01/2020 1440   NITRITE NEGATIVE 08/01/2020 1440   LEUKOCYTESUR NEGATIVE 08/01/2020 1440   Sepsis Labs Invalid input(s): PROCALCITONIN,  WBC,  LACTICIDVEN Microbiology Recent Results (from the past 240 hour(s))  SARS Coronavirus 2 by RT PCR (hospital order, performed in [place] hospital lab) Nasopharyngeal Nasopharyngeal Swab     Status: None   Collection Time: 08/01/20  6:45 PM   Specimen: Nasopharyngeal Swab  Result Value Ref Range Status   SARS Coronavirus 2 NEGATIVE NEGATIVE Final    Comment: (NOTE) SARS-CoV-2 target nucleic acids are NOT DETECTED.  The SARS-CoV-2 RNA is generally detectable in upper and lower respiratory specimens  during the acute phase of infection. The lowest concentration of SARS-CoV-2 viral copies this assay can detect is 250 copies / mL. A negative result does not preclude SARS-CoV-2 infection and should not be used as the sole basis for treatment or other patient management decisions.  A negative result may occur with improper specimen collection / handling, submission of specimen other than nasopharyngeal swab, presence of viral mutation(s) within the areas targeted by this assay, and inadequate number of viral copies (<250 copies / mL). A negative result must be combined with clinical observations, patient history, and epidemiological information.  Fact Sheet for Patients:   StrictlyIdeas.no  Fact Sheet for Healthcare Providers: BankingDealers.co.za  This test is not yet approved or  cleared by the [place] FDA and has been authorized for detection and/or diagnosis of SARS-CoV-2 by FDA under an Emergency Use Authorization (EUA).  This EUA will remain in effect (meaning this test can be used) for the duration of the COVID-19 declaration under Section 564(b)(1) of the Act, 21 U.S.C. section 360bbb-3(b)(1), unless the authorization is terminated or revoked sooner.  Performed at [facility], [place] [address] [place],  [address]    Time spent: 30 min  SIGNED:   Marylu Lund, MD  [facility] 08/07/2020, 1:28 PM  If 7PM-7AM, please contact night-coverage

## 2020-08-07 NOTE — TOC Progression Note (Signed)
Transition of Care [facility]) - Progression Note    Patient Details  Name: Mary Villanueva MRN: [id number] Date of Birth: 1956-04-01  Transition of [facility]) CM/SW Contact  Pollie Friar, RN Phone Number: 08/07/2020, 12:21 PM  Clinical Narrative:    Insurance for CIR went to peer to peer. CM has been updated that the peer to peer was won and pt will be d/cing to [employer]. CM has reached out to [facility] and she is requesting a covid test prior to admission.  Bedside RN updated on rapid covid.  Pt will transport via [facility] once covid resulted.  TOC following.   Expected Discharge Plan: IP Rehab Facility Barriers to Discharge: Continued Medical Work up  Expected Discharge Plan and Services Expected Discharge Plan: [place]   Discharge Planning Services: CM Consult Post Acute Care Choice: IP Rehab Living arrangements for the past 2 months: [place]: PT [place]: [place] Date [place]: 08/02/20   Representative spoke with at [place]: [place] ([place]) Interventions    Readmission Risk Interventions No flowsheet data found.

## 2020-08-07 NOTE — Progress Notes (Signed)
Physical Therapy Treatment Patient Details Name: Mary Villanueva MRN: [id number] DOB: [birth year] Today's Date: 08/07/2020    History of Present Illness Stanislawa Gaffin Grigorian is a 64 y.o. female with medical history significant of multiple sclerosis, hypertension, hyperlipidemia, depression/anxiety presents to emergency department with worsening bilateral leg weakness, painful muscle spasm and fall x2.    PT Comments    Pt progressing towards physical therapy goals. Focus of session was transfer to/from [facility], linen change, and peri-care after leaking purewick was discovered while pt in chair. Pt reports feeling weaker today and noted pt with increased effort to advance feet around during transition to/from [facility]. Mod assist provided throughout mobility. Will continue to follow and progress as able per POC.     Follow Up Recommendations  CIR     Equipment Recommendations  None recommended by PT    Recommendations for Other Services Rehab consult     Precautions / Restrictions Precautions Precautions: Fall Restrictions Weight Bearing Restrictions: No    Mobility  Bed Mobility               General bed mobility comments: Pt was received sitting up in the recliner. Pt reports she slept in the recliner last night as she does not like the bed.   Transfers Overall transfer level: Needs assistance Equipment used: 4-wheeled walker Transfers: Sit to/from Stand Sit to Stand: Mod assist Stand pivot transfers: Mod assist       General transfer comment: Mod assist throughout stand and pivotal steps to the [facility]. VC's for proper hand placement on seated surface for safety. Pt wanting to unlock the rollator prior to getting her BOS under COG so she can pull the walker closer to her. Education provided on safety awareness.   Ambulation/Gait             General Gait Details: Pt unable to tolerate further gait training. Pt with effortful advancement of feet to/from BSC.     Stairs             Wheelchair Mobility    Modified Rankin (Stroke Patients Only)       Balance Overall balance assessment: Needs assistance;History of Falls Sitting-balance support: No upper extremity supported;Feet supported Sitting balance-Leahy Scale: Fair Sitting balance - Comments: mild posterior lean with dynamic LB ADL engagement requiring min guard for safety  Postural control: Posterior lean Standing balance support: Bilateral upper extremity supported;During functional activity Standing balance-Leahy Scale: Poor Standing balance comment: more difficulty with balance today                            Cognition Arousal/Alertness: Awake/alert Behavior During Therapy: WFL for tasks assessed/performed Overall Cognitive Status: Within Functional Limits for tasks assessed                                        Exercises      General Comments        Pertinent Vitals/Pain Pain Assessment: Faces Faces Pain Scale: Hurts a little bit Pain Location: General grimacing during mobility Pain Descriptors / Indicators: Grimacing Pain Intervention(s): Monitored during session    Home Living                      Prior Function            PT Goals (current goals can now  be found in the care plan section) Acute Rehab PT Goals Patient Stated Goal: to get to rehab and then get home  PT Goal Formulation: With patient Time For Goal Achievement: 08/09/20 Potential to Achieve Goals: Fair Progress towards PT goals: Progressing toward goals    Frequency    Min 3X/week      PT Plan Current plan remains appropriate    Co-evaluation              AM-PAC PT "6 Clicks" Mobility   Outcome Measure  Help needed turning from your back to your side while in a flat bed without using bedrails?: A Little Help needed moving from lying on your back to sitting on the side of a flat bed without using bedrails?: A Little Help needed  moving to and from a bed to a chair (including a wheelchair)?: A Lot Help needed standing up from a chair using your arms (e.g., wheelchair or bedside chair)?: A Lot Help needed to walk in hospital room?: A Lot Help needed climbing 3-5 steps with a railing? : Total 6 Click Score: 13    End of Session Equipment Utilized During Treatment: Gait belt Activity Tolerance: Patient tolerated treatment well;Patient limited by fatigue Patient left: in chair;with chair alarm set;with call bell/phone within reach Nurse Communication: Mobility status PT Visit Diagnosis: Unsteadiness on feet (R26.81);Difficulty in walking, not elsewhere classified (R26.2);Other abnormalities of gait and mobility (R26.89);Repeated falls (R29.6)     Time: 7342-8768 PT Time Calculation (min) (ACUTE ONLY): 23 min  Charges:  $Gait Training: 8-22 mins $Therapeutic Activity: 8-22 mins                     Rolinda Roan, PT, DPT Acute Rehabilitation Services Pager: [phone] Office: [phone]    Thelma Comp 08/07/2020, 9:18 AM

## 2020-08-09 ENCOUNTER — Ambulatory Visit: Payer: Medicare Other | Admitting: Neurology

## 2020-08-09 ENCOUNTER — Encounter: Payer: Self-pay | Admitting: Neurology
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
## 2020-09-05 ENCOUNTER — Ambulatory Visit: Payer: Medicare Other | Admitting: Neurology

## 2020-09-12 ENCOUNTER — Ambulatory Visit: Payer: Medicare Other | Admitting: Neurology

## 2020-11-01 ENCOUNTER — Other Ambulatory Visit: Payer: Self-pay | Admitting: *Deleted

## 2020-11-01 MED ORDER — TIZANIDINE HCL 4 MG PO TABS
4.0000 mg | ORAL_TABLET | Freq: Every day | ORAL | 3 refills | Status: DC
Start: 2020-11-01 — End: 2022-04-24

## 2020-12-03 ENCOUNTER — Other Ambulatory Visit: Payer: Self-pay | Admitting: Neurology

## 2021-04-17 ENCOUNTER — Other Ambulatory Visit: Payer: Self-pay | Admitting: Neurology

## 2021-05-02 ENCOUNTER — Other Ambulatory Visit: Payer: Self-pay | Admitting: Neurology

## 2021-05-15 ENCOUNTER — Other Ambulatory Visit: Payer: Self-pay | Admitting: Neurology

## 2022-01-09 ENCOUNTER — Other Ambulatory Visit: Payer: Self-pay | Admitting: Neurology

## 2022-01-27 IMAGING — CT CT HEAD W/O CM
4 series · 16 of 47 positions shown, 18 images · non-contrast
Comparison: 11/04/2018 MRI

CLINICAL DATA: Recent fall with headaches and neck pain, initial
encounter

EXAM:
CT HEAD WITHOUT CONTRAST
CT CERVICAL SPINE WITHOUT CONTRAST
TECHNIQUE: Multidetector CT imaging of the head and cervical spine was
performed following the standard protocol without intravenous
contrast. Multiplanar CT image reconstructions of the cervical spine
were also generated.

[Series 3: head wo · axial · 0.43mm/px · z∈[-138,-18]mm · 7 of 32 slices shown, 9 images]
[im 4/32  brain]
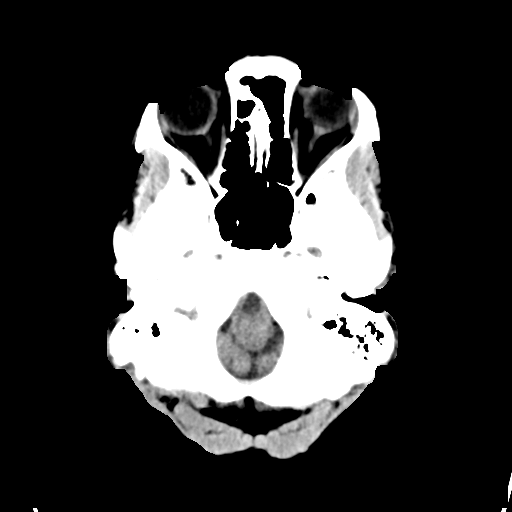
[im 4/32  bone]
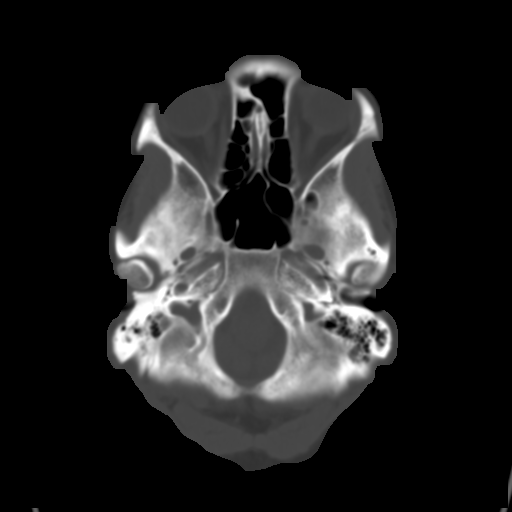
[im 8/32  brain]
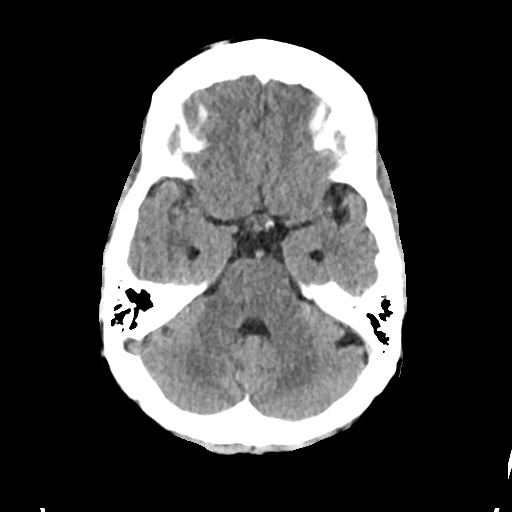
[im 12/32  brain]
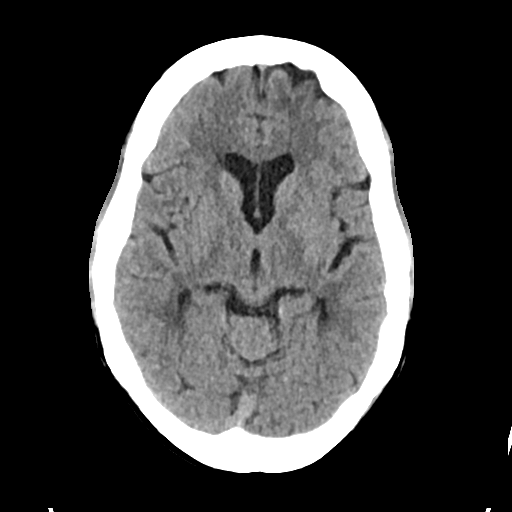
[im 16/32  brain]
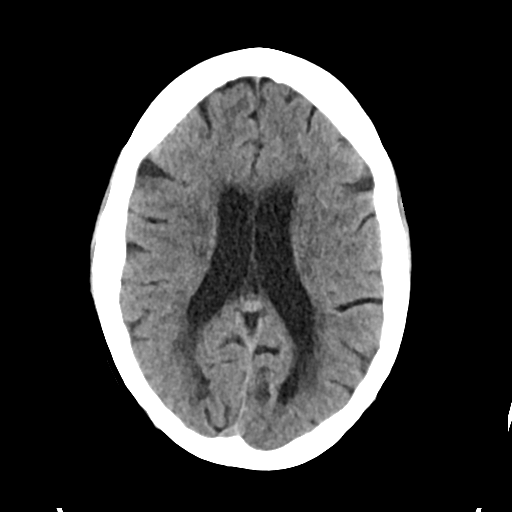
[im 20/32  brain]
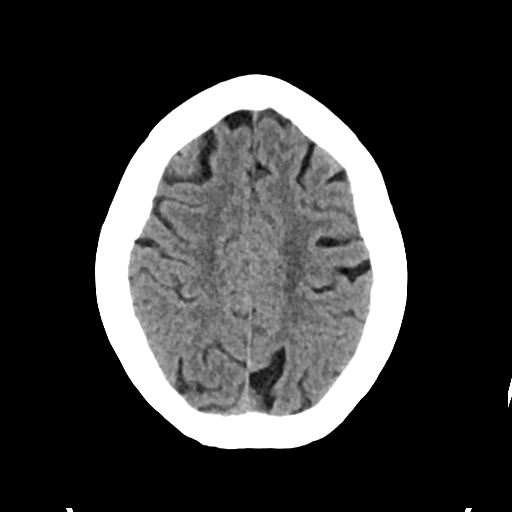
[im 20/32  bone]
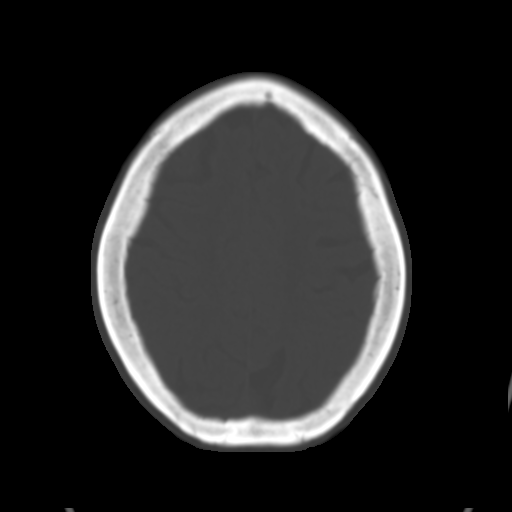
[im 24/32  brain]
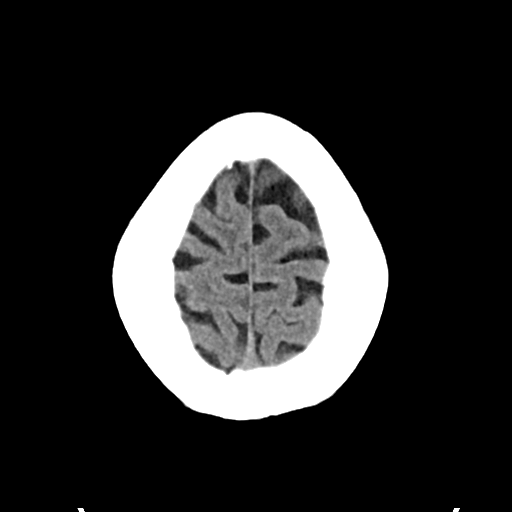
[im 28/32  brain]
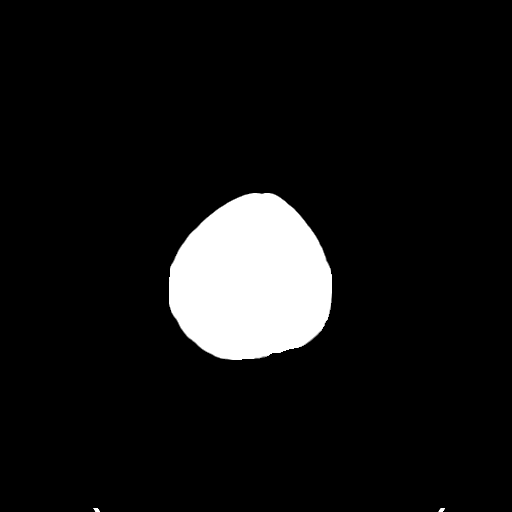

[Series 4: head bone · axial · 0.43mm/px · z∈[-139,-107]mm · 3 of 79 slices shown]
[im 8/79  bone]
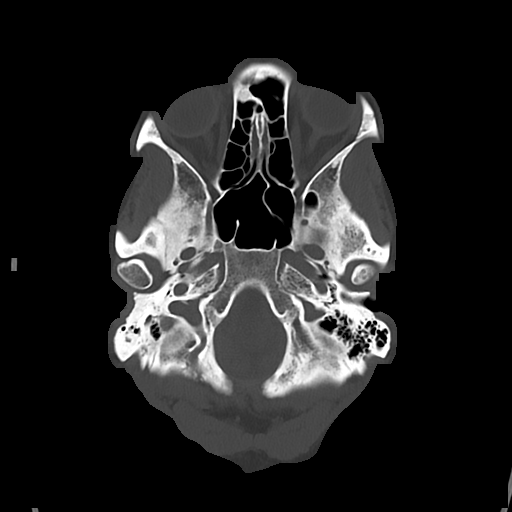
[im 16/79  bone]
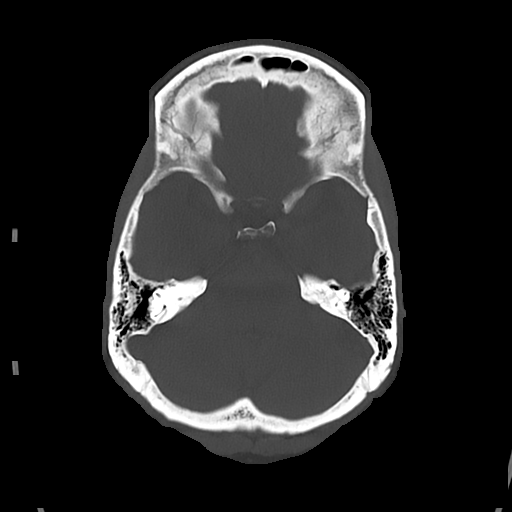
[im 24/79  bone]
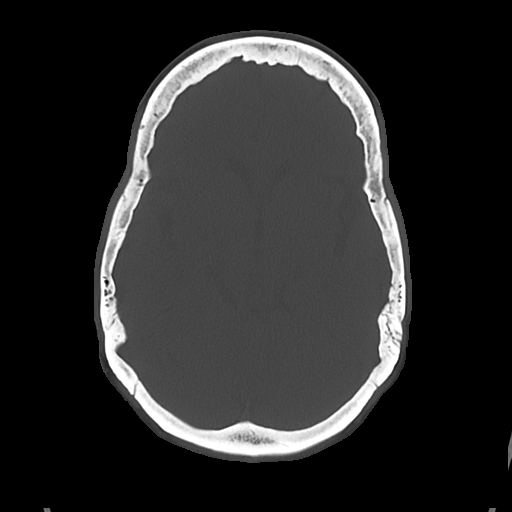

[Series 5: cor soft · coronal · 0.31mm/px · 3 of 65 slices shown]
[im 22/65  brain]
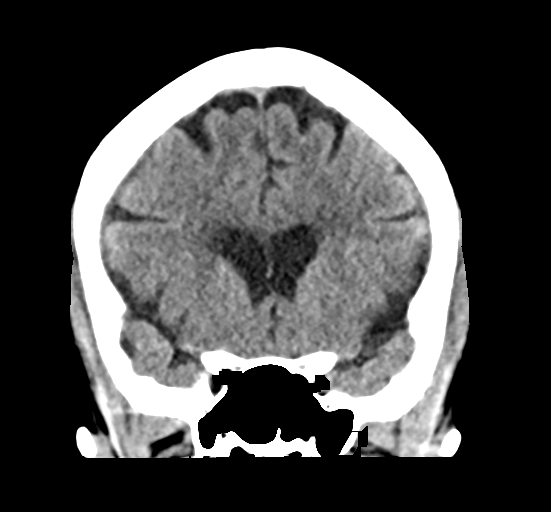
[im 29/65  brain]
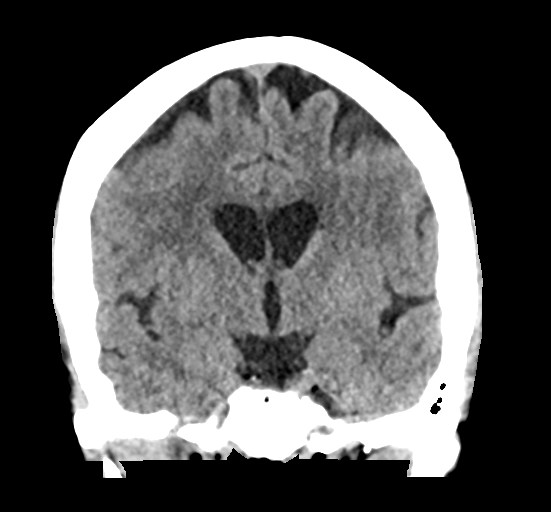
[im 36/65  brain]
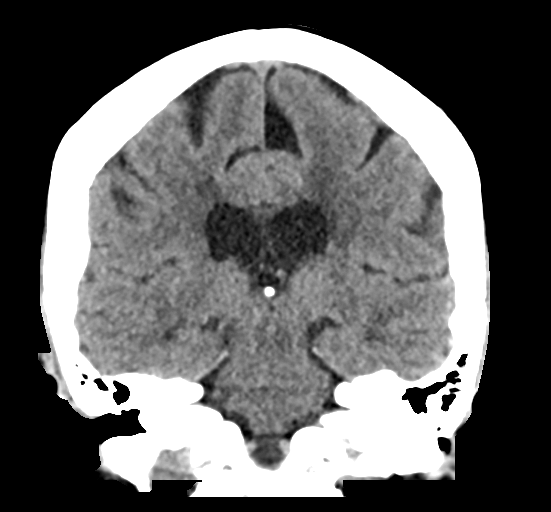

[Series 6: sag soft · sagittal · 0.31mm/px · 3 of 57 slices shown]
[im 19/57  brain]
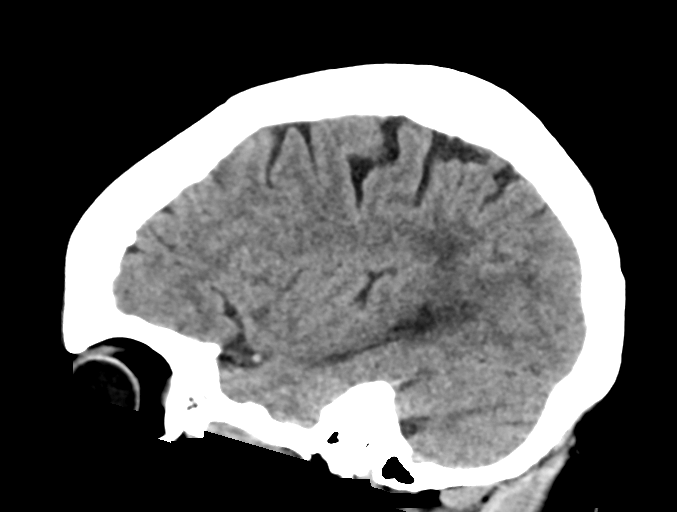
[im 29/57  brain]
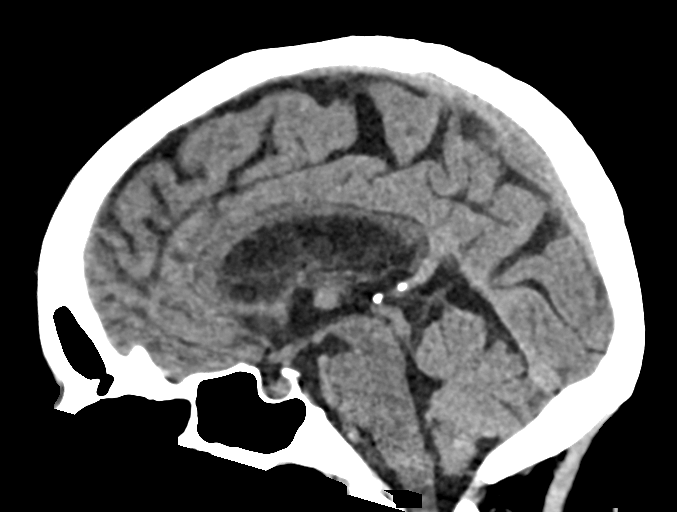
[im 38/57  brain]
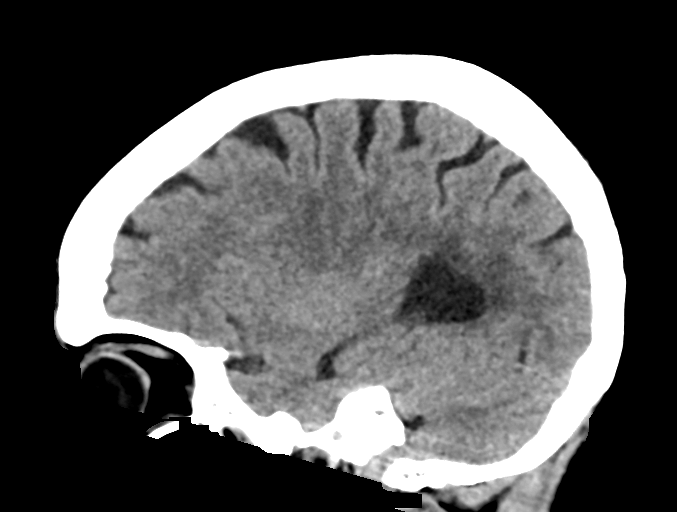

[16 of 47 positions shown; findings below may reference images not displayed]

FINDINGS: CT HEAD FINDINGS

Brain: No findings to suggest acute hemorrhage, acute infarction or
space-occupying mass lesion are noted. There are geographic areas of
decreased attenuation which correspond with that seen on recent MRI
as well as with patient's given clinical history.

Vascular: No hyperdense vessel or unexpected calcification.

Skull: Normal. Negative for fracture or focal lesion.

Sinuses/Orbits: No acute finding.

Other: None.

CT CERVICAL SPINE FINDINGS

Alignment: Normal.

Skull base and vertebrae: 7 cervical segments are well visualized.
Vertebral body height is well maintained. No acute fracture or acute
facet abnormality is noted. Mild facet hypertrophic changes are
seen. Mild osteophytic changes are noted most prominent posteriorly
at C3-4.

Soft tissues and spinal canal: Surrounding soft tissue structures
appear within normal limits with the exception of a 2.3 cm hypodense
nodule within the left lobe of the thyroid.

Upper chest: Visualized lung apices are unremarkable.

Other: None
IMPRESSION: CT of the head: Changes consistent with the given clinical history
of MS.

No acute abnormality noted.

CT of the cervical spine: Multilevel degenerative change.

2.3 cm hypodense nodule in the left lobe of the thyroid. Recommend
nonemergent thyroid US(ref: [HOSPITAL]. [DATE]):

## 2022-03-20 ENCOUNTER — Other Ambulatory Visit: Payer: Self-pay | Admitting: Internal Medicine

## 2022-03-20 DIAGNOSIS — R5381 Other malaise: Secondary | ICD-10-CM

## 2022-03-21 ENCOUNTER — Other Ambulatory Visit: Payer: Self-pay | Admitting: Internal Medicine

## 2022-03-21 DIAGNOSIS — Z1231 Encounter for screening mammogram for malignant neoplasm of breast: Secondary | ICD-10-CM

## 2022-04-24 ENCOUNTER — Telehealth: Payer: Self-pay | Admitting: Neurology

## 2022-04-24 MED ORDER — TIZANIDINE HCL 4 MG PO TABS: 4.0000 mg | ORAL_TABLET | Freq: Every day | ORAL | 0 refills | Status: DC

## 2022-04-24 NOTE — Telephone Encounter (Signed)
Pt request refill fortiZANidine (ZANAFLEX) 4 MG tablet and baclofen (LIORESAL) 10 MG tablet at [place] had an scheduled 04/29/22.

## 2022-04-24 NOTE — Telephone Encounter (Addendum)
The patient was last seen 09/09/2019. She was provided a courtesy refill of baclofen on 12/05/20 for 90-days. Tizanidine was last sent 11/01/20 (#90 x 3). Since she has scheduled an appt on 04/29/22, we can provide one more additional refill of the baclofen. She has had multiple no shows and cancellations. She will have to keep this appt. Otherwise, she will need to speak to her PCP for management of her medications. I left her a voicemail letting her know the follow up must be kept for any other refills from our office.

## 2022-04-29 ENCOUNTER — Ambulatory Visit: Payer: Medicare Other | Admitting: Neurology

## 2022-04-29 ENCOUNTER — Telehealth: Payer: Self-pay | Admitting: Neurology

## 2022-04-29 ENCOUNTER — Encounter: Payer: Self-pay | Admitting: Neurology

## 2022-04-29 VITALS — BP 126/69 | HR 82 | Ht 61.0 in

## 2022-04-29 DIAGNOSIS — G8220 Paraplegia, unspecified: Secondary | ICD-10-CM

## 2022-04-29 DIAGNOSIS — G35 Multiple sclerosis: Secondary | ICD-10-CM

## 2022-04-29 DIAGNOSIS — N390 Urinary tract infection, site not specified: Secondary | ICD-10-CM

## 2022-04-29 MED ORDER — TIZANIDINE HCL 4 MG PO TABS: 4.0000 mg | ORAL_TABLET | Freq: Every day | ORAL | 3 refills | Status: DC

## 2022-04-29 MED ORDER — BACLOFEN 10 MG PO TABS: 10.0000 mg | ORAL_TABLET | Freq: Three times a day (TID) | ORAL | 3 refills | Status: DC

## 2022-04-29 NOTE — Telephone Encounter (Signed)
Marjory Lies with [facility] will be taking this patient at the end of the week

## 2022-04-29 NOTE — Progress Notes (Signed)
Chief Complaint  Patient presents with   Follow-up    Rm 15. Accompanied by Dedra Skeens. Numbness, spasm in bilateral legs and feet.      ASSESSMENT AND PLAN  Mary Villanueva is a 66 y.o. female   Secondary progressive multiple sclerosis Lower extremity spasticity Progressive worsening gait abnormality  Increased spasticity since she ran out of her baclofen 1 to 2 weeks ago, was on 10 mg 3 times a day, refilled her prescription along with tizanidine 4 mg daily,  She complains of cloudy urine, history of UTI in the past, which can also worsen lower extremity spasticity, UA, urine culture  Referral to home health  Also discussed with patient about use of recumbent bike for home exercise,  DIAGNOSTIC DATA (LABS, IMAGING, TESTING) - I reviewed patient records, labs, notes, testing and imaging myself where available. I personally reviewed MRI of lumbar spine with without contrast in September 2021, mild degenerative changes no evidence of canal foraminal narrowing  CT of cervical spine in September 21, mild degenerative changes CT head periventricular hypodensity signal abnormality,   MRI of the brain with without contrast December 2019: Moderate periventricular subcortical chronic demyelinating plaque, no contrast-enhancement, no change compared to 2015  MRI of cervical spine, multiple chronic demyelinating plaque from C3-C7 T1, mild degenerative changes, no significant canal stenosis, no change from 2015  MEDICAL HISTORY:  Mary Villanueva is here to follow-up bilateral lower extremity, secondary progressive multiple sclerosis, her primary care physician is Dr. [name],  I reviewed and summarized the referring note. 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 She has progressive gait  disorder requiring a walker since 2004, with stiff left leg and weakness distally more than proximally in her left leg. She has weakness in her left leg more so than the right.    She has bladder incontinence without bowel incontinence. She is on both VESIcare and Enablex with dry mouth  She uses a motorized scooter at times. She is able to drive a car, lives on her own,  and is independent in her activities of daily living.  Her last Driver's license evaluation was 2011 and  included a road test.  She has progressive worsening gait abnormality, give up driving in 9937,  She has tried [employer], Rebif in 2006, made her feel worse, felt weaker, she has plateued now since 2006, no longer on any immunomodulation therapy,  She had her first EMG guided Botox injection for bilateral lower extremity spasticity from 20 14-2017 intermittently, eventually stop receiving Botox injection was not sure about the benefit   She was also involving baclofen extended release research trial, now on regular baclofen 10 mg 3 times a day  Because she gave up driving, she ended up spending most of the time at home alone, watching TV, become more sedentary, barely walking anymore, she can still able to transfer herself in and out of wheelchair, denies bowel or bladder incontinence, does complain cloudy urine in the past few days  She 1 out of her baclofen 10 mg 3 times a day couple weeks ago, noticed increased bilateral lower extremity spasticity, no significant increased pain, but bilateral lower extremity especially right leg jumping, can be very annoying for her  She also complains of weight gain over the past couple years  PHYSICAL EXAM:  Vitals:   04/29/22 1120  BP: 126/69  Pulse: 82  Height: '5\' 1"'$  (1.549 m)     Body mass index is 27.49 kg/m.  PHYSICAL EXAMNIATION:  Gen: NAD, conversant, well nourised, well groomed                     Cardiovascular: Regular rate rhythm, no peripheral edema, warm,  nontender. Eyes: Conjunctivae clear without exudates or hemorrhage Neck: Supple, no carotid bruits. Pulmonary: Clear to auscultation bilaterally   NEUROLOGICAL EXAM:  MENTAL STATUS: Speech/cognition: Awake, alert, oriented to history taking and casual conversation CRANIAL NERVES: CN II: Visual fields are full to confrontation. Pupils are round equal and briskly reactive to light. CN III, IV, VI: extraocular movement are normal. No ptosis. CN V: Facial sensation is intact to light touch CN VII: Face is symmetric with normal eye closure  CN VIII: Hearing is normal to causal conversation. CN IX, X: Phonation is normal. CN XI: Head turning and shoulder shrug are intact  MOTOR: Mild weakness, fixation of left upper extremity on rapid rotating movement, moderate bilateral lower extremity spasticity, left more than right hip flexion, ankle dorsiflexion weakness,  REFLEXES: Reflexes are 3 and symmetric at the biceps, triceps, knees, and ankles.    SENSORY: Intact to light touch, pinprick and vibratory sensation at the ankle  COORDINATION: There is no trunk or limb dysmetria noted.  GAIT/STANCE: Not ambulatory  REVIEW OF SYSTEMS:  Full 14 system review of systems performed and notable only for as above All other review of systems were negative.   ALLERGIES: No Known Allergies  HOME MEDICATIONS: Current Outpatient Medications  Medication Sig Dispense Refill   baclofen (LIORESAL) 10 MG tablet Take 1 tablet (10 mg total) by mouth 3 (three) times daily. Appointment needed for further refills. 90 tablet 0   Biotin 10 MG CAPS Take 10 mg by mouth every morning.      buPROPion (WELLBUTRIN SR) 150 MG 12 hr tablet Take 150 mg by mouth daily.      calcium carbonate (OS-CAL) 600 MG TABS Take 600 mg by mouth 2 (two) times daily with a meal.     Cholecalciferol (VITAMIN D-3) 1000 UNITS CAPS Take 1,000 Units by mouth 2 (two) times daily.     FLUoxetine (PROZAC) 40 MG capsule TAKE 1 CAPSULE BY  MOUTH 1  TIME DAILY WITH 20 MG  CAPSULE (Patient taking differently: Take 40 mg by mouth daily.) 90 capsule 0   Multiple Vitamins-Minerals (CENTRUM SILVER 50+WOMEN PO) Take 1 capsule by mouth daily.     Omega-3 Fatty Acids (FISH OIL) 1200 MG CAPS Take 1,200 mg by mouth daily.     oxybutynin (DITROPAN-XL) 10 MG 24 hr tablet Take 10 mg by mouth at bedtime.      Probiotic Product (PROBIOTIC DAILY PO) Take 1 capsule by mouth daily.     rosuvastatin (CRESTOR) 20 MG tablet Take 20 mg by mouth at bedtime.     terbinafine (LAMISIL) 250 MG tablet Take 250 mg by mouth daily.     tiZANidine (ZANAFLEX) 4 MG tablet Take 1 tablet (4 mg total) by mouth at bedtime. 90 tablet 0   vitamin B-12 (CYANOCOBALAMIN) 500 MCG tablet Take 500 mcg by mouth daily.     No current facility-administered medications for this visit.    PAST MEDICAL HISTORY: Past Medical History:  Diagnosis Date   Abdominal pain 05/2018   Depression    High cholesterol    Hypercholesteremia    Hypertension    MS (multiple sclerosis) ([place])  PAST SURGICAL HISTORY: Past Surgical History:  Procedure Laterality Date   BREAST REDUCTION SURGERY  1998    FAMILY HISTORY: Family History  Problem Relation Age of Onset   Stroke Mother    Heart disease Father    Diabetes Sister    Diabetes Brother     SOCIAL HISTORY: Social History   Socioeconomic History   Marital status: Single    Spouse name: Not on file   Number of children: 0   Years of education: college   Highest education level: Not on file  Occupational History   Occupation: retired Therapist, sports  Tobacco Use   Smoking status: Former    Types: Cigarettes   Smokeless tobacco: Never   Tobacco comments:    Quit 36 Years ago.  Vaping Use   Vaping Use: Never used  Substance and Sexual Activity   Alcohol use: Yes    Alcohol/week: 1.0 standard drink    Types: 1 Glasses of wine per week    Comment: ralely   Drug use: No   Sexual activity: Not on file  Other Topics Concern    Not on file  Social History Narrative   Patient has her masters retired Therapist, sports. Patient lives at home alone. Right handed. Caffeine one cup daily coffee.   Social Determinants of Health   Financial Resource Strain: Not on file  Food Insecurity: Not on file  Transportation Needs: Not on file  Physical Activity: Not on file  Stress: Not on file  Social Connections: Not on file  Intimate Partner Violence: Not on file      Marcial Pacas, M.D. Ph.D.  [facility] [address], [place], [place] [address] Ph: [phone] Fax: [phone]  CC:  [name], MD [place],  [place] [address]  [name], MD

## 2022-05-01 LAB — URINALYSIS, ROUTINE W REFLEX MICROSCOPIC
Bilirubin, UA: NEGATIVE
Glucose, UA: NEGATIVE
Ketones, UA: NEGATIVE
Nitrite, UA: NEGATIVE
Protein,UA: NEGATIVE
RBC, UA: NEGATIVE
Specific Gravity, UA: 1.015 (ref 1.005–1.030)
Urobilinogen, Ur: 0.2 mg/dL (ref 0.2–1.0)
pH, UA: 5.5 (ref 5.0–7.5)

## 2022-05-01 LAB — MICROSCOPIC EXAMINATION
Bacteria, UA: NONE SEEN
Casts: NONE SEEN /lpf
RBC, Urine: NONE SEEN /hpf (ref 0–2)
WBC, UA: NONE SEEN /hpf (ref 0–5)

## 2022-05-01 LAB — URINE CULTURE

## 2022-05-06 ENCOUNTER — Telehealth: Payer: Self-pay

## 2022-05-06 NOTE — Telephone Encounter (Signed)
Elta Guadeloupe, PT from [facility] called office.  I was able to take call.  Elta Guadeloupe, PT is requesting home health physical therapy orders for patient.  He is requesting orders for 1 times a week for 1 week, 2 times a week for 4 weeks, 1 time a week for 3 weeks.  The start date would be May 03, 2022.  His phone number is [phone].  I was able to give him VOs for the [facility] PT as ordered by Dr. Krista Blue. Mark verbalized understanding.

## 2022-06-03 ENCOUNTER — Telehealth: Payer: Self-pay | Admitting: Neurology

## 2022-06-03 NOTE — Telephone Encounter (Signed)
Gennaro Africa from [facility] is calling and wanting to increase PT orders for Pt. Mary Villanueva would like a return call for VO orders to be authorize.

## 2022-06-03 NOTE — Telephone Encounter (Signed)
I returned Mary Villanueva's call and provided verbal order for physical therapy via vm. Advised to call back if she had questions.

## 2022-06-27 ENCOUNTER — Telehealth: Payer: Self-pay | Admitting: Neurology

## 2022-06-27 NOTE — Telephone Encounter (Signed)
Gennaro Africa with [facility] has called for Home health PT 1 week 9 and an OT evaluation, Kate's vm is secure

## 2022-06-27 NOTE — Telephone Encounter (Signed)
I returned the call to [facility]. She will proceed with PT and add OT to help shower transfers.

## 2022-07-22 ENCOUNTER — Telehealth: Payer: Self-pay | Admitting: Neurology

## 2022-07-22 DIAGNOSIS — G35 Multiple sclerosis: Secondary | ICD-10-CM

## 2022-07-22 DIAGNOSIS — G8220 Paraplegia, unspecified: Secondary | ICD-10-CM

## 2022-07-22 NOTE — Telephone Encounter (Signed)
I spoke with [name] at [facility] and provided verbal orders for continued OT to assist with shower transfers.  [name] also stated the patient was requesting a referral for a social worker or home aide to assist with her activities of daily living. She would like help 2-3 days per week.

## 2022-07-22 NOTE — Telephone Encounter (Signed)
[facility] [name]) request verbal orders for OT.Would like a call from nurse. 1x wk for 5 wks.

## 2022-07-22 NOTE — Telephone Encounter (Signed)
Orders Placed This Encounter  Procedures   Ambulatory referral to [facility] to add on OT, social work and home aid

## 2022-07-23 ENCOUNTER — Telehealth: Payer: Self-pay | Admitting: Neurology

## 2022-07-23 NOTE — Telephone Encounter (Signed)
Sent to [place] with [facility] to see if she is able to take the patient.

## 2022-07-23 NOTE — Telephone Encounter (Signed)
Al[facility] accepted this patient.

## 2022-08-09 ENCOUNTER — Telehealth: Payer: Self-pay | Admitting: Neurology

## 2022-08-09 NOTE — Telephone Encounter (Signed)
Gennaro Africa from [facility] called needing Social Work Eval orders for the pt.

## 2022-08-12 NOTE — Telephone Encounter (Signed)
I called the [name] back and provided verbal order for Education officer, museum

## 2022-08-20 NOTE — Telephone Encounter (Signed)
MSW Physician order placed on MD desk for signature.

## 2022-09-02 ENCOUNTER — Telehealth: Payer: Self-pay | Admitting: Neurology

## 2022-09-02 NOTE — Telephone Encounter (Signed)
I called the occupational therapist back and left a vm providing ok for therapy orders.

## 2022-09-02 NOTE — Telephone Encounter (Signed)
Tobey Bride is calling from [facility]. Requesting verbal order for OT 2x a week for 8 weeks. Tobey Bride is asking for a return call.

## 2023-02-13 ENCOUNTER — Other Ambulatory Visit: Payer: Self-pay | Admitting: Neurology

## 2023-02-17 NOTE — Telephone Encounter (Signed)
Routing to provider for review: When trying to refill this drug I received a hard stop: Duplicate Medication/Therapy: baclofen, tiZANidineCENTRAL MUSCLE RELAXANTS

## 2023-06-11 ENCOUNTER — Other Ambulatory Visit: Payer: Self-pay | Admitting: Neurology

## 2023-12-09 ENCOUNTER — Other Ambulatory Visit: Payer: Self-pay | Admitting: Family Medicine

## 2023-12-09 ENCOUNTER — Ambulatory Visit (HOSPITAL_COMMUNITY)
Admission: RE | Admit: 2023-12-09 | Discharge: 2023-12-09 | Disposition: A | Discharge status: Home / Self Care | Payer: Medicare Other | Source: Ambulatory Visit | Attending: Vascular Surgery | Admitting: Vascular Surgery

## 2023-12-09 DIAGNOSIS — R600 Localized edema: Secondary | ICD-10-CM

## 2023-12-29 ENCOUNTER — Encounter (HOSPITAL_COMMUNITY): Payer: Medicare Other

## 2024-03-15 ENCOUNTER — Other Ambulatory Visit (HOSPITAL_COMMUNITY): Payer: Self-pay | Admitting: Family Medicine

## 2024-03-15 DIAGNOSIS — R600 Localized edema: Secondary | ICD-10-CM

## 2024-03-16 ENCOUNTER — Ambulatory Visit (HOSPITAL_COMMUNITY): Attending: Cardiovascular Disease

## 2024-03-16 DIAGNOSIS — R600 Localized edema: Secondary | ICD-10-CM | POA: Diagnosis not present

## 2024-03-16 LAB — ECHOCARDIOGRAM COMPLETE
Area-P 1/2: 3.85 cm2
S' Lateral: 1.55 cm

## 2024-03-18 ENCOUNTER — Other Ambulatory Visit: Payer: Self-pay | Admitting: Neurology

## 2024-03-23 ENCOUNTER — Ambulatory Visit: Admitting: Orthopedic Surgery

## 2024-03-23 DIAGNOSIS — I87332 Chronic venous hypertension (idiopathic) with ulcer and inflammation of left lower extremity: Secondary | ICD-10-CM | POA: Diagnosis not present

## 2024-03-26 ENCOUNTER — Encounter: Payer: Self-pay | Admitting: Orthopedic Surgery

## 2024-03-26 NOTE — Progress Notes (Signed)
 Office Visit Note   Patient: Mary Villanueva           Date of Birth: 1956/06/14           MRN: [id number] Visit Date: 03/23/2024              Requested by: Barnetta Liberty, MD [address] [place],  [place] [address] PCP: Barnetta Liberty, MD  Chief Complaint  Patient presents with   Left Leg - Wound Check      HPI: Patient is a 68 year old woman who was seen for initial evaluation for venous insufficiency ulcer.  Patient states this started with a blister that she popped.  Patient has been followed by [facility].  Patient is currently wrapped in Unna paste Curlex and Coban.  Assessment & Plan: Visit Diagnoses:  1. Chronic venous hypertension (idiopathic) with ulcer and inflammation of left lower extremity (HCC)     Plan: Will apply a multilayer compression wrap.  Patient is nonambulatory secondary to her MS so she would not be a good candidate for our tissue graft study.  Will apply for insurance authorization for application of tissue graft.  Follow-Up Instructions: No follow-ups on file.   Ortho Exam  Patient is alert, oriented, no adenopathy, well-dressed, normal affect, normal respiratory effort. Examination patient has a venous ulcer on the left leg that measures 4.5 x 3 cm.  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  She is nonambulatory.  The Doppler was used and she does have a good biphasic dorsalis pedis and posterior tibial pulse.  Imaging: No results found.   Labs: Lab Results  Component Value Date   HGBA1C 5.6 08/02/2020   ESRSEDRATE 6 02/07/2014   CRP <0.5 02/07/2014   LABURIC 4.4 02/07/2014     Lab Results  Component Value Date   ALBUMIN 3.0 (L) 08/05/2020   ALBUMIN 3.3 (L) 08/04/2020   ALBUMIN 3.1 (L) 08/03/2020    Lab Results  Component Value Date   MG 2.1 08/05/2020   MG 2.1 08/04/2020   MG 2.0 08/03/2020   No results found for:  "VD25OH"  No results found for: "PREALBUMIN"    Latest Ref Rng & Units 08/05/2020    3:21 AM 08/04/2020    1:56 AM 08/03/2020    4:34 AM  CBC EXTENDED  WBC 4.0 - 10.5 K/uL 8.9  9.0  10.5   RBC 3.87 - 5.11 MIL/uL 3.45  3.56  3.43   Hemoglobin 12.0 - 15.0 g/dL 19.1  47.8  29.5   HCT 36.0 - 46.0 % 33.0  33.5  32.6   Platelets 150 - 400 K/uL 190  202  201   NEUT# 1.7 - 7.7 K/uL 7.0  8.3  9.7   Lymph# 0.7 - 4.0 K/uL 1.0  0.3  0.5      There is no height or weight on file to calculate BMI.  Orders:  No orders of the defined types were placed in this encounter.  No orders of the defined types were placed in this encounter.    Procedures: No procedures performed  Clinical Data: No additional findings.  ROS:  All other systems negative, except as noted in the HPI. Review of Systems  Objective: Vital Signs: There were no vitals taken for this visit.  Specialty Comments:  No specialty comments available.  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) 07/28/2013   Hypertension    Depression    High cholesterol    Multiple sclerosis exacerbation (HCC)    Hypercholesteremia    Multiple sclerosis (HCC) 03/22/2013   Past Medical History:  Diagnosis Date   Abdominal pain 05/2018   Depression    High cholesterol    Hypercholesteremia    Hypertension    MS (multiple sclerosis) (HCC)     Family History  Problem Relation Age of Onset   Stroke Mother    Heart disease Father    Diabetes Sister    Diabetes Brother     Past Surgical History:  Procedure Laterality Date   BREAST REDUCTION SURGERY  1998   Social History    Occupational History   Occupation: retired Charity fundraiser  Tobacco Use   Smoking status: Former    Types: Cigarettes   Smokeless tobacco: Never   Tobacco comments:    Quit 36 Years ago.  Vaping Use   Vaping status: Never Used  Substance and Sexual Activity   Alcohol use: Yes    Alcohol/week: 1.0 standard drink of alcohol    Types: 1 Glasses of wine per week    Comment: ralely   Drug use: No   Sexual activity: Not on file

## 2024-03-30 ENCOUNTER — Ambulatory Visit: Admitting: Orthopedic Surgery

## 2024-03-31 ENCOUNTER — Ambulatory Visit: Admitting: Orthopedic Surgery

## 2024-03-31 ENCOUNTER — Other Ambulatory Visit: Payer: Self-pay | Admitting: Internal Medicine

## 2024-03-31 DIAGNOSIS — Z1231 Encounter for screening mammogram for malignant neoplasm of breast: Secondary | ICD-10-CM

## 2024-04-05 ENCOUNTER — Ambulatory Visit

## 2024-04-06 ENCOUNTER — Ambulatory Visit: Admitting: Orthopedic Surgery

## 2024-04-06 DIAGNOSIS — I87332 Chronic venous hypertension (idiopathic) with ulcer and inflammation of left lower extremity: Secondary | ICD-10-CM

## 2024-04-20 ENCOUNTER — Encounter: Payer: Self-pay | Admitting: Orthopedic Surgery

## 2024-04-20 NOTE — Progress Notes (Signed)
 Office Visit Note   Patient: Mary Villanueva           Date of Birth: October 06, 1956           MRN: [id number] Visit Date: 04/06/2024              Requested by: Barnetta Liberty, MD [address] [place],  [place] [address] PCP: Barnetta Liberty, MD  Chief Complaint  Patient presents with   Left Leg - Follow-up      HPI: Patient is a 68 year old woman with left lower extremity venous ulceration currently in a multilayer compression dressing.  Assessment & Plan: Visit Diagnoses:  1. Chronic venous hypertension (idiopathic) with ulcer and inflammation of left lower extremity (HCC)     Plan: The ulcer is healed recommend resume compression stockings.  Follow-Up Instructions: Return if symptoms worsen or fail to improve.   Ortho Exam  Patient is alert, serration.  There is good wrinkling of the skin.  Oriented, no adenopathy, well-dressed, normal affect, normal respiratory effort. Examination of the posterior left calf ulcer has healed.  She has a good dorsalis pedis pulse no cellulitis no further role  Imaging: No results found. No images are attached to the encounter.  Labs: Lab Results  Component Value Date   HGBA1C 5.6 08/02/2020   ESRSEDRATE 6 02/07/2014   CRP <0.5 02/07/2014   LABURIC 4.4 02/07/2014     Lab Results  Component Value Date   ALBUMIN 3.0 (L) 08/05/2020   ALBUMIN 3.3 (L) 08/04/2020   ALBUMIN 3.1 (L) 08/03/2020    Lab Results  Component Value Date   MG 2.1 08/05/2020   MG 2.1 08/04/2020   MG 2.0 08/03/2020   No results found for: "VD25OH"  No results found for: "PREALBUMIN"    Latest Ref Rng & Units 08/05/2020    3:21 AM 08/04/2020    1:56 AM 08/03/2020    4:34 AM  CBC EXTENDED  WBC 4.0 - 10.5 K/uL 8.9  9.0  10.5   RBC 3.87 - 5.11 MIL/uL 3.45  3.56  3.43   Hemoglobin 12.0 - 15.0 g/dL 84.1  32.4  40.1   HCT 36.0 - 46.0 % 33.0  33.5  32.6   Platelets 150 - 400 K/uL 190  202  201   NEUT# 1.7 - 7.7 K/uL 7.0  8.3  9.7   Lymph# 0.7 - 4.0  K/uL 1.0  0.3  0.5      There is no height or weight on file to calculate BMI.  Orders:  No orders of the defined types were placed in this encounter.  No orders of the defined types were placed in this encounter.    Procedures: No procedures performed  Clinical Data: No additional findings.  ROS:  All other systems negative, except as noted in the HPI. Review of Systems  Objective: Vital Signs: There were no vitals taken for this visit.  Specialty Comments:  No specialty comments available.  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) 07/28/2013   Hypertension    Depression    High cholesterol    Multiple sclerosis exacerbation (HCC)    Hypercholesteremia    Multiple sclerosis (  HCC) 03/22/2013   Past Medical History:  Diagnosis Date   Abdominal pain 05/2018   Depression    High cholesterol    Hypercholesteremia    Hypertension    MS (multiple sclerosis) (HCC)     Family History  Problem Relation Age of Onset   Stroke Mother    Heart disease Father    Diabetes Sister    Diabetes Brother     Past Surgical History:  Procedure Laterality Date   BREAST REDUCTION SURGERY  1998   Social History   Occupational History   Occupation: retired Charity fundraiser  Tobacco Use   Smoking status: Former    Types: Cigarettes   Smokeless tobacco: Never   Tobacco comments:    Quit 36 Years ago.  Vaping Use   Vaping status: Never Used  Substance and Sexual Activity   Alcohol use: Yes    Alcohol/week: 1.0 standard drink of alcohol    Types: 1 Glasses of wine per week    Comment: ralely   Drug use: No   Sexual activity: Not on  file

## 2024-09-27 ENCOUNTER — Encounter: Payer: Self-pay | Admitting: Radiology

## 2024-10-28 ENCOUNTER — Emergency Department (HOSPITAL_COMMUNITY)

## 2024-10-28 ENCOUNTER — Other Ambulatory Visit: Payer: Self-pay

## 2024-10-28 ENCOUNTER — Telehealth: Payer: Self-pay | Admitting: Neurology

## 2024-10-28 ENCOUNTER — Emergency Department (HOSPITAL_COMMUNITY)
Admission: EM | Admit: 2024-10-28 | Discharge: 2024-10-28 | Disposition: A | Discharge status: Home / Self Care | Attending: Emergency Medicine | Admitting: Emergency Medicine

## 2024-10-28 DIAGNOSIS — R29898 Other symptoms and signs involving the musculoskeletal system: Secondary | ICD-10-CM

## 2024-10-28 DIAGNOSIS — N390 Urinary tract infection, site not specified: Secondary | ICD-10-CM | POA: Diagnosis not present

## 2024-10-28 DIAGNOSIS — R600 Localized edema: Secondary | ICD-10-CM | POA: Diagnosis not present

## 2024-10-28 DIAGNOSIS — W1830XA Fall on same level, unspecified, initial encounter: Secondary | ICD-10-CM | POA: Diagnosis not present

## 2024-10-28 DIAGNOSIS — R531 Weakness: Secondary | ICD-10-CM | POA: Diagnosis present

## 2024-10-28 DIAGNOSIS — G35D Multiple sclerosis, unspecified: Secondary | ICD-10-CM | POA: Insufficient documentation

## 2024-10-28 DIAGNOSIS — W19XXXA Unspecified fall, initial encounter: Secondary | ICD-10-CM

## 2024-10-28 LAB — URINALYSIS, ROUTINE W REFLEX MICROSCOPIC
Bilirubin Urine: NEGATIVE
Glucose, UA: NEGATIVE mg/dL
Hgb urine dipstick: NEGATIVE
Ketones, ur: NEGATIVE mg/dL
Nitrite: POSITIVE — AB
Protein, ur: NEGATIVE mg/dL
Specific Gravity, Urine: 1.014 (ref 1.005–1.030)
pH: 8 (ref 5.0–8.0)

## 2024-10-28 LAB — CBC WITH DIFFERENTIAL/PLATELET
Abs Immature Granulocytes: 0.03 K/uL (ref 0.00–0.07)
Basophils Absolute: 0 K/uL (ref 0.0–0.1)
Basophils Relative: 0 %
Eosinophils Absolute: 0 K/uL (ref 0.0–0.5)
Eosinophils Relative: 1 %
HCT: 36.4 % (ref 36.0–46.0)
Hemoglobin: 12.1 g/dL (ref 12.0–15.0)
Immature Granulocytes: 1 %
Lymphocytes Relative: 14 %
Lymphs Abs: 0.8 K/uL (ref 0.7–4.0)
MCH: 31.1 pg (ref 26.0–34.0)
MCHC: 33.2 g/dL (ref 30.0–36.0)
MCV: 93.6 fL (ref 80.0–100.0)
Monocytes Absolute: 0.6 K/uL (ref 0.1–1.0)
Monocytes Relative: 11 %
Neutro Abs: 4 K/uL (ref 1.7–7.7)
Neutrophils Relative %: 73 %
Platelets: 220 K/uL (ref 150–400)
RBC: 3.89 MIL/uL (ref 3.87–5.11)
RDW: 12.5 % (ref 11.5–15.5)
WBC: 5.5 K/uL (ref 4.0–10.5)
nRBC: 0 % (ref 0.0–0.2)

## 2024-10-28 LAB — PROTIME-INR
INR: 1 (ref 0.8–1.2)
Prothrombin Time: 13.3 s (ref 11.4–15.2)

## 2024-10-28 LAB — BASIC METABOLIC PANEL WITH GFR
Anion gap: 7 (ref 5–15)
BUN: 19 mg/dL (ref 8–23)
CO2: 25 mmol/L (ref 22–32)
Calcium: 8.1 mg/dL — ABNORMAL LOW (ref 8.9–10.3)
Chloride: 105 mmol/L (ref 98–111)
Creatinine, Ser: 1.18 mg/dL — ABNORMAL HIGH (ref 0.44–1.00)
GFR, Estimated: 50 mL/min — ABNORMAL LOW (ref >60)
Glucose, Bld: 91 mg/dL (ref 70–99)
Potassium: 4.6 mmol/L (ref 3.5–5.1)
Sodium: 137 mmol/L (ref 135–145)

## 2024-10-28 MED ORDER — CEPHALEXIN 250 MG PO CAPS
500.0000 mg | ORAL_CAPSULE | Freq: Once | ORAL | Status: AC
  Administered 2024-10-28: 500 mg via ORAL
  Filled 2024-10-28: qty 2

## 2024-10-28 MED ORDER — CEPHALEXIN 500 MG PO CAPS: 500.0000 mg | ORAL_CAPSULE | Freq: Two times a day (BID) | ORAL | 0 refills | Status: DC

## 2024-10-28 NOTE — Telephone Encounter (Signed)
 Patient said, this morning after getting up from the bed could not stand. Through out the day has not changed. Would like a call back. Schedule patient an appointment on 11/01/24 at 3:00 pm

## 2024-10-28 NOTE — Discharge Instructions (Signed)
 You were seen in the department after a fall and your left leg giving out.  You had x-rays of your hip knee and ankle that did not show any fracture.  Your urine showed possible signs of urinary tract infection.  You felt you are able to go home.  I think it would be good idea to get an ultrasound of your leg to make sure there is not a blood clot as it is swollen.  Unfortunately ultrasound was gone for the day.  I am ordering you an outpatient ultrasound.  Please follow-up with your neurologist.  Return if any worsening or concerning symptoms.

## 2024-10-28 NOTE — ED Notes (Signed)
 EDP at [facility]

## 2024-10-28 NOTE — Telephone Encounter (Signed)
 Called pt at [phone]. Reports she could not stand on L leg when she went to stand up this morning out of bed. This is new for her. Has full feeling of leg.  SABRA Has to hold onto something to stand up. Denies any other sx. Recommended she proceed to ED for immediate evaluation/treatment.   She was last seen here 2023. She did not have appt scheduled 11/01/24 at 3pm as documented.  I scheduled appt with Dr. Onita for follow up 11/01/24 at 830am.

## 2024-10-28 NOTE — ED Notes (Signed)
 Patient transported to X-ray

## 2024-10-28 NOTE — ED Notes (Signed)
 Pt states her brother will be at the house to let PTAR in the home.

## 2024-10-28 NOTE — ED Triage Notes (Signed)
 Pt. BIB [facility] from home with c/o a fall and L leg weakness. Per [facility] pt. Fell this morning heading to the BF. Per [facility] pt. Presents with a swollen L ankle but denies any pain. Pt. Has a Hx of MS.   VSS per [facility]

## 2024-10-28 NOTE — ED Provider Notes (Signed)
 [place] EMERGENCY DEPARTMENT AT [facility] Provider Note   CSN: [id number] Arrival date & time: 10/28/24  1719     Patient presents with: Fall and Weakness   Mary Villanueva is a 68 y.o. female.  She has a history of MS and is mostly in the wheelchair unable to ambulate.  She said she was transferring from recliner to wheelchair today when her left leg gave out and she fell to the floor.  She has new swelling of that leg.  She said she has had MS for 40 years and it was stable for a while but it has been getting progressively worse over last 10 years.  She follows with neurology Dr. Onita.  She is on baclofen  for spasticity but no other immune modulators.  She denies hitting her head or loss of consciousness.  No fevers chills nausea vomiting or urinary symptoms.  She does not think anything is broken and she still has sensation in her leg.   The history is provided by the patient.  Fall This is a new problem. The problem has not changed since onset.The symptoms are aggravated by standing. Nothing relieves the symptoms. She has tried nothing for the symptoms. The treatment provided no relief.       Prior to Admission medications   Medication Sig Start Date End Date Taking? Authorizing Provider  baclofen  (LIORESAL ) 10 MG tablet Take 1 tablet (10 mg total) by mouth 3 (three) times daily. 04/29/22   Onita Duos, MD  baclofen  (LIORESAL ) 10 MG tablet TAKE 1 TABLET BY MOUTH 3 TIMES  DAILY 03/18/24   Onita Duos, MD  Biotin  10 MG CAPS Take 10 mg by mouth every morning.     [provider]  buPROPion  (WELLBUTRIN  SR) 150 MG 12 hr tablet Take 150 mg by mouth daily.  11/10/14   [provider]  calcium  carbonate (OS-CAL) 600 MG TABS Take 600 mg by mouth 2 (two) times daily with a meal.    [provider]  Cholecalciferol  (VITAMIN D -3) 1000 UNITS CAPS Take 1,000 Units by mouth 2 (two) times daily.    [provider]  FLUoxetine  (PROZAC ) 40 MG capsule  TAKE 1 CAPSULE BY MOUTH 1  TIME DAILY WITH 20 MG  CAPSULE Patient taking differently: Take 40 mg by mouth daily. 10/30/18   Onita Duos, MD  Multiple Vitamins-Minerals (CENTRUM SILVER 50+WOMEN PO) Take 1 capsule by mouth daily.    [provider]  Omega-3 Fatty Acids (FISH OIL) 1200 MG CAPS Take 1,200 mg by mouth daily.    [provider]  oxybutynin  (DITROPAN -XL) 10 MG 24 hr tablet Take 10 mg by mouth at bedtime.  07/14/14   [provider]  Probiotic Product (PROBIOTIC DAILY PO) Take 1 capsule by mouth daily.    [provider]  rosuvastatin  (CRESTOR ) 20 MG tablet Take 20 mg by mouth at bedtime. 03/19/22   [provider]  terbinafine (LAMISIL) 250 MG tablet Take 250 mg by mouth daily. 03/19/22   [provider]  tiZANidine  (ZANAFLEX ) 4 MG tablet Take 1 tablet (4 mg total) by mouth at bedtime. 04/29/22   Onita Duos, MD  vitamin B-12 (CYANOCOBALAMIN ) 500 MCG tablet Take 500 mcg by mouth daily.    [provider]    Allergies: Patient has no known allergies.    Review of Systems  Updated Vital Signs BP (!) 120/53   Pulse 82   Temp 98.9 F (37.2 C) (Oral)   Resp 18  Ht 5' (1.524 m)   Wt 65.8 kg   SpO2 100%   BMI 28.32 kg/m   Physical Exam Vitals and nursing note reviewed.  Constitutional:      General: She is not in acute distress.    Appearance: Normal appearance. She is well-developed.  HENT:     Head: Normocephalic and atraumatic.  Eyes:     Conjunctiva/sclera: Conjunctivae normal.  Cardiovascular:     Rate and Rhythm: Normal rate and regular rhythm.     Heart sounds: No murmur heard. Pulmonary:     Effort: Pulmonary effort is normal. No respiratory distress.     Breath sounds: Normal breath sounds. No stridor. No wheezing.  Abdominal:     Palpations: Abdomen is soft.     Tenderness: There is no abdominal tenderness. There is no guarding or rebound.  Musculoskeletal:        General: No tenderness or deformity.      Cervical back: Neck supple.     Left lower leg: Edema present.  Skin:    General: Skin is warm and dry.  Neurological:     Mental Status: She is alert.     GCS: GCS eye subscore is 4. GCS verbal subscore is 5. GCS motor subscore is 6.     Motor: Weakness present.     Comments: She is awake and alert.  She has fairly good function of her upper extremities.  She has minimal use of her lower extremities.  They are hypertonic.  She has some swelling of her left lower leg.  There is no cords appreciated.  Distal pulses are intact.     (all labs ordered are listed, but only abnormal results are displayed) Labs Reviewed  BASIC METABOLIC PANEL WITH GFR - Abnormal; Notable for the following components:      Result Value   Creatinine, Ser 1.18 (*)    Calcium  8.1 (*)    GFR, Estimated 50 (*)    All other components within normal limits  URINALYSIS, ROUTINE W REFLEX MICROSCOPIC - Abnormal; Notable for the following components:   APPearance CLOUDY (*)    Nitrite POSITIVE (*)    Leukocytes,Ua TRACE (*)    Bacteria, UA MANY (*)    All other components within normal limits  URINE CULTURE  CBC WITH DIFFERENTIAL/PLATELET  PROTIME-INR    EKG: None  Radiology: DG Knee Complete 4 Views Left Result Date: 10/28/2024 EXAM: 4 VIEW(S) XRAY OF THE LEFT KNEE 10/28/2024 07:20:00 PM COMPARISON: None available. CLINICAL HISTORY: fall injury FINDINGS: BONES AND JOINTS: No acute fracture. No malalignment. No significant joint effusion. Mild degenerative changes of the medial compartment with osteophyte formation. SOFT TISSUES: The soft tissues are unremarkable. IMPRESSION: 1. No acute fracture or dislocation. Electronically signed by: Norman Gatlin MD 10/28/2024 07:49 PM EST RP Workstation: [id number]   DG Ankle Complete Left Result Date: 10/28/2024 EXAM: 3 OR MORE VIEW(S) XRAY OF THE LEFT ANKLE 10/28/2024 07:20:00 PM CLINICAL HISTORY: fall injury COMPARISON: None available. FINDINGS: BONES AND JOINTS:  No acute fracture. No dislocation. Cavovarus deformity of the left foot. Hammertoes. SOFT TISSUES: Subcutaneous soft tissue edema. IMPRESSION: 1. No acute fracture or dislocation. Electronically signed by: Norman Gatlin MD 10/28/2024 07:49 PM EST RP Workstation: [id number]   DG Hip Unilat With Pelvis 2-3 Views Left Result Date: 10/28/2024 EXAM: 2 OR MORE VIEW(S) XRAY OF THE HIP 10/28/2024 07:20:00 PM COMPARISON: None available. CLINICAL HISTORY: fall injury FINDINGS: BONES AND JOINTS: No acute fracture. No malalignment. SOFT TISSUES: Vascular calcification.  IMPRESSION: 1. No acute fracture or dislocation. Electronically signed by: Norman Gatlin MD 10/28/2024 07:46 PM EST RP Workstation: [id number]   DG Chest 1 View Result Date: 10/28/2024 EXAM: 1 VIEW(S) XRAY OF THE CHEST 10/28/2024 07:20:00 PM COMPARISON: None available. CLINICAL HISTORY: fall injury FINDINGS: LUNGS AND PLEURA: Elevated right hemidiaphragm. Right basilar atelectasis. Otherwise no focal pulmonary opacity. No pleural effusion. No pneumothorax. HEART AND MEDIASTINUM: Aortic arch atherosclerosis. No acute abnormality of the cardiac silhouette. BONES AND SOFT TISSUES: No acute osseous abnormality. IMPRESSION: 1. No acute process. Electronically signed by: Norman Gatlin MD 10/28/2024 07:45 PM EST RP Workstation: [id number]     Procedures   Medications Ordered in the ED  cephALEXin  (KEFLEX ) capsule 500 mg (500 mg Oral Given 10/28/24 2114)    Clinical Course as of 10/29/24 1020  Thu Oct 28, 2024  2031 Patient's workup has been fairly negative.  Urine is equivocal.  Will cover with antibiotics.  She would like to go home.  She is mostly nonambulatory so we will send her home by PT [facility].  She said her brother is there and can help her. [MB]    Clinical Course User Index [MB] Towana Ozell BROCKS, MD                                 Medical Decision Making Amount and/or Complexity of Data Reviewed Labs: ordered. Radiology:  ordered.  Risk Prescription drug management.   This patient complains of fall, general weakness; this involves an extensive number of treatment Options and is a complaint that carries with it a high risk of complications and morbidity. The differential includes MS exacerbation, deconditioning, fracture, contusion, infection  I ordered, reviewed and interpreted labs, which included CBC normal chemistries at baseline, urinalysis possible signs of infection with bacteria and nitrite positive I ordered medication oral antibiotics and reviewed PMP when indicated. I ordered imaging studies which included x-rays of chest hip ankle knee and I independently    visualized and interpreted imaging which showed no acute findings Previous records obtained and reviewed in epic including prior neurology notes Cardiac monitoring reviewed, sinus rhythm Social determinants considered, no significant barriers Critical Interventions: None  After the interventions stated above, I reevaluated the patient and found clinically to have no sign of fracture.  Would like to get a DVT study but unfortunately they have left for the day.  Will order her outpatient study. Admission and further testing considered, I feel with her chronic difficulties and transfers that she may benefit from admission for further evaluation and possibly PT consult.  Patient declines and would rather be transported home.  She does not have her wheelchair with her so we will send her back by ambulance.  Her brother helps care for her at home.  Recommended close follow-up with her treatment team.  Return instructions discussed      Final diagnoses:  Left leg weakness  Fall, initial encounter  Multiple sclerosis  Lower urinary tract infectious disease    ED Discharge Orders          Ordered    cephALEXin  (KEFLEX ) 500 MG capsule  2 times daily        10/28/24 2036    LE Venous       Comments: IMPORTANT PATIENT INSTRUCTIONS: You have  been scheduled for an Outpatient Vascular Study at [facility].  If tomorrow is a Saturday, Sunday or holiday, please go to the [facility] Registration Desk at 11 am tomorrow morning and tell them you are there for a vascular study.  If tomorrow is a weekday (Monday-Friday), please go to the [facility] (address [address], [place]) at 8 am and report to the 4th floor registration Zone A.  Inform registration that you are there for a vascular study.   10/28/24 2037               Towana Ozell BROCKS, MD 10/29/24 [phone]

## 2024-10-28 NOTE — ED Notes (Signed)
Ptar called pt is next on the list

## 2024-10-29 ENCOUNTER — Encounter (HOSPITAL_COMMUNITY): Payer: Self-pay

## 2024-10-29 ENCOUNTER — Observation Stay (HOSPITAL_COMMUNITY)
Admission: EM | Admit: 2024-10-29 | Discharge: 2024-11-07 | Disposition: A | Discharge status: Skilled Nursing Facility | Attending: Family Medicine | Admitting: Family Medicine

## 2024-10-29 ENCOUNTER — Emergency Department (HOSPITAL_COMMUNITY)

## 2024-10-29 DIAGNOSIS — E039 Hypothyroidism, unspecified: Secondary | ICD-10-CM | POA: Diagnosis present

## 2024-10-29 DIAGNOSIS — E860 Dehydration: Secondary | ICD-10-CM | POA: Diagnosis not present

## 2024-10-29 DIAGNOSIS — N390 Urinary tract infection, site not specified: Principal | ICD-10-CM

## 2024-10-29 DIAGNOSIS — N1832 Chronic kidney disease, stage 3b: Secondary | ICD-10-CM

## 2024-10-29 DIAGNOSIS — R29898 Other symptoms and signs involving the musculoskeletal system: Secondary | ICD-10-CM | POA: Diagnosis not present

## 2024-10-29 DIAGNOSIS — N3000 Acute cystitis without hematuria: Secondary | ICD-10-CM | POA: Diagnosis not present

## 2024-10-29 DIAGNOSIS — N183 Chronic kidney disease, stage 3 unspecified: Secondary | ICD-10-CM | POA: Insufficient documentation

## 2024-10-29 DIAGNOSIS — Z993 Dependence on wheelchair: Secondary | ICD-10-CM

## 2024-10-29 DIAGNOSIS — R269 Unspecified abnormalities of gait and mobility: Secondary | ICD-10-CM

## 2024-10-29 DIAGNOSIS — N1831 Chronic kidney disease, stage 3a: Secondary | ICD-10-CM | POA: Diagnosis not present

## 2024-10-29 DIAGNOSIS — I129 Hypertensive chronic kidney disease with stage 1 through stage 4 chronic kidney disease, or unspecified chronic kidney disease: Secondary | ICD-10-CM | POA: Diagnosis not present

## 2024-10-29 DIAGNOSIS — F32A Depression, unspecified: Secondary | ICD-10-CM | POA: Diagnosis not present

## 2024-10-29 DIAGNOSIS — N3281 Overactive bladder: Secondary | ICD-10-CM | POA: Insufficient documentation

## 2024-10-29 DIAGNOSIS — K5900 Constipation, unspecified: Secondary | ICD-10-CM | POA: Diagnosis not present

## 2024-10-29 DIAGNOSIS — E038 Other specified hypothyroidism: Secondary | ICD-10-CM

## 2024-10-29 DIAGNOSIS — I10 Essential (primary) hypertension: Secondary | ICD-10-CM | POA: Diagnosis not present

## 2024-10-29 DIAGNOSIS — M62831 Muscle spasm of calf: Secondary | ICD-10-CM | POA: Diagnosis not present

## 2024-10-29 DIAGNOSIS — R531 Weakness: Secondary | ICD-10-CM | POA: Diagnosis present

## 2024-10-29 DIAGNOSIS — R2689 Other abnormalities of gait and mobility: Secondary | ICD-10-CM | POA: Diagnosis not present

## 2024-10-29 DIAGNOSIS — Z8659 Personal history of other mental and behavioral disorders: Secondary | ICD-10-CM

## 2024-10-29 DIAGNOSIS — G35D Multiple sclerosis, unspecified: Secondary | ICD-10-CM | POA: Diagnosis present

## 2024-10-29 LAB — CBC WITH DIFFERENTIAL/PLATELET
Abs Immature Granulocytes: 0.01 K/uL (ref 0.00–0.07)
Basophils Absolute: 0 K/uL (ref 0.0–0.1)
Basophils Relative: 0 %
Eosinophils Absolute: 0.1 K/uL (ref 0.0–0.5)
Eosinophils Relative: 1 %
HCT: 36.3 % (ref 36.0–46.0)
Hemoglobin: 12 g/dL (ref 12.0–15.0)
Immature Granulocytes: 0 %
Lymphocytes Relative: 12 %
Lymphs Abs: 0.8 K/uL (ref 0.7–4.0)
MCH: 31.3 pg (ref 26.0–34.0)
MCHC: 33.1 g/dL (ref 30.0–36.0)
MCV: 94.5 fL (ref 80.0–100.0)
Monocytes Absolute: 0.7 K/uL (ref 0.1–1.0)
Monocytes Relative: 12 %
Neutro Abs: 4.7 K/uL (ref 1.7–7.7)
Neutrophils Relative %: 75 %
Platelets: 225 K/uL (ref 150–400)
RBC: 3.84 MIL/uL — ABNORMAL LOW (ref 3.87–5.11)
RDW: 12.6 % (ref 11.5–15.5)
WBC: 6.3 K/uL (ref 4.0–10.5)
nRBC: 0 % (ref 0.0–0.2)

## 2024-10-29 LAB — COMPREHENSIVE METABOLIC PANEL WITH GFR
ALT: 41 U/L (ref 0–44)
AST: 30 U/L (ref 15–41)
Albumin: 3.1 g/dL — ABNORMAL LOW (ref 3.5–5.0)
Alkaline Phosphatase: 58 U/L (ref 38–126)
Anion gap: 13 (ref 5–15)
BUN: 22 mg/dL (ref 8–23)
CO2: 21 mmol/L — ABNORMAL LOW (ref 22–32)
Calcium: 8.5 mg/dL — ABNORMAL LOW (ref 8.9–10.3)
Chloride: 106 mmol/L (ref 98–111)
Creatinine, Ser: 1.23 mg/dL — ABNORMAL HIGH (ref 0.44–1.00)
GFR, Estimated: 48 mL/min — ABNORMAL LOW (ref >60)
Glucose, Bld: 86 mg/dL (ref 70–99)
Potassium: 4.4 mmol/L (ref 3.5–5.1)
Sodium: 140 mmol/L (ref 135–145)
Total Bilirubin: 0.3 mg/dL (ref 0.0–1.2)
Total Protein: 5.9 g/dL — ABNORMAL LOW (ref 6.5–8.1)

## 2024-10-29 LAB — I-STAT CG4 LACTIC ACID, ED: Lactic Acid, Venous: 0.8 mmol/L (ref 0.5–1.9)

## 2024-10-29 LAB — URINALYSIS, ROUTINE W REFLEX MICROSCOPIC
Bilirubin Urine: NEGATIVE
Glucose, UA: NEGATIVE mg/dL
Hgb urine dipstick: NEGATIVE
Ketones, ur: NEGATIVE mg/dL
Leukocytes,Ua: NEGATIVE
Nitrite: POSITIVE — AB
Protein, ur: NEGATIVE mg/dL
Specific Gravity, Urine: 1.015 (ref 1.005–1.030)
pH: 7 (ref 5.0–8.0)

## 2024-10-29 MED ORDER — ONDANSETRON HCL 4 MG/2ML IJ SOLN: 4.0000 mg | Freq: Four times a day (QID) | INTRAMUSCULAR | Status: DC | PRN

## 2024-10-29 MED ORDER — GADOBUTROL 1 MMOL/ML IV SOLN
7.0000 mL | Freq: Once | INTRAVENOUS | Status: AC | PRN
  Administered 2024-10-29: 7 mL via INTRAVENOUS

## 2024-10-29 MED ORDER — RISAQUAD PO CAPS
1.0000 | ORAL_CAPSULE | Freq: Every day | ORAL | Status: DC
  Administered 2024-10-30: 1 via ORAL
  Filled 2024-10-29 (×3): qty 1

## 2024-10-29 MED ORDER — ACETAMINOPHEN 325 MG PO TABS
650.0000 mg | ORAL_TABLET | Freq: Four times a day (QID) | ORAL | Status: DC | PRN
  Administered 2024-11-04 (×2): 650 mg via ORAL
  Filled 2024-10-29 (×2): qty 2

## 2024-10-29 MED ORDER — TIZANIDINE HCL 4 MG PO TABS
4.0000 mg | ORAL_TABLET | Freq: Every day | ORAL | Status: DC
  Administered 2024-10-29 – 2024-11-03 (×6): 4 mg via ORAL
  Filled 2024-10-29 (×6): qty 1

## 2024-10-29 MED ORDER — BIOTIN 10 MG PO CAPS: 10.0000 mg | ORAL_CAPSULE | Freq: Every morning | ORAL | Status: DC

## 2024-10-29 MED ORDER — VITAMIN D 25 MCG (1000 UNIT) PO TABS
1000.0000 [IU] | ORAL_TABLET | Freq: Two times a day (BID) | ORAL | Status: DC
  Administered 2024-10-29 – 2024-10-30 (×2): 1000 [IU] via ORAL
  Filled 2024-10-29 (×4): qty 1

## 2024-10-29 MED ORDER — BACLOFEN 10 MG PO TABS
10.0000 mg | ORAL_TABLET | Freq: Three times a day (TID) | ORAL | Status: DC
  Administered 2024-10-29 – 2024-11-01 (×8): 10 mg via ORAL
  Filled 2024-10-29 (×7): qty 1

## 2024-10-29 MED ORDER — SODIUM CHLORIDE 0.9% FLUSH
3.0000 mL | Freq: Two times a day (BID) | INTRAVENOUS | Status: DC
  Administered 2024-10-30 – 2024-11-07 (×18): 3 mL via INTRAVENOUS

## 2024-10-29 MED ORDER — ADULT MULTIVITAMIN W/MINERALS CH
1.0000 | ORAL_TABLET | Freq: Every day | ORAL | Status: DC
  Filled 2024-10-29 (×2): qty 1

## 2024-10-29 MED ORDER — ONDANSETRON HCL 4 MG PO TABS: 4.0000 mg | ORAL_TABLET | Freq: Four times a day (QID) | ORAL | Status: DC | PRN

## 2024-10-29 MED ORDER — BUPROPION HCL ER (SR) 150 MG PO TB12
150.0000 mg | ORAL_TABLET | Freq: Every day | ORAL | Status: DC
  Administered 2024-10-30: 150 mg via ORAL
  Filled 2024-10-29 (×2): qty 1

## 2024-10-29 MED ORDER — OXYBUTYNIN CHLORIDE ER 10 MG PO TB24
10.0000 mg | ORAL_TABLET | Freq: Every day | ORAL | Status: DC
  Administered 2024-10-29 – 2024-11-06 (×9): 10 mg via ORAL
  Filled 2024-10-29 (×10): qty 1

## 2024-10-29 MED ORDER — FLUOXETINE HCL 20 MG PO CAPS
40.0000 mg | ORAL_CAPSULE | Freq: Every day | ORAL | Status: DC
  Administered 2024-10-30 – 2024-10-31 (×2): 40 mg via ORAL
  Filled 2024-10-29 (×2): qty 2

## 2024-10-29 MED ORDER — SODIUM CHLORIDE 0.9 % IV SOLN: 250.0000 mL | INTRAVENOUS | Status: AC | PRN

## 2024-10-29 MED ORDER — SODIUM CHLORIDE 0.9 % IV SOLN
2.0000 g | Freq: Once | INTRAVENOUS | Status: AC
  Administered 2024-10-29: 2 g via INTRAVENOUS
  Filled 2024-10-29: qty 20

## 2024-10-29 MED ORDER — SODIUM CHLORIDE 0.9% FLUSH
3.0000 mL | Freq: Two times a day (BID) | INTRAVENOUS | Status: DC
  Administered 2024-10-30 – 2024-11-07 (×14): 3 mL via INTRAVENOUS

## 2024-10-29 MED ORDER — ROSUVASTATIN CALCIUM 20 MG PO TABS
20.0000 mg | ORAL_TABLET | Freq: Every day | ORAL | Status: DC
  Administered 2024-10-29 – 2024-11-06 (×9): 20 mg via ORAL
  Filled 2024-10-29 (×9): qty 1

## 2024-10-29 MED ORDER — ACETAMINOPHEN 650 MG RE SUPP: 650.0000 mg | Freq: Four times a day (QID) | RECTAL | Status: DC | PRN

## 2024-10-29 MED ORDER — ENOXAPARIN SODIUM 40 MG/0.4ML IJ SOSY
40.0000 mg | PREFILLED_SYRINGE | INTRAMUSCULAR | Status: DC
  Administered 2024-10-29 – 2024-11-06 (×9): 40 mg via SUBCUTANEOUS
  Filled 2024-10-29 (×9): qty 0.4

## 2024-10-29 MED ORDER — SODIUM CHLORIDE 0.9% FLUSH: 3.0000 mL | INTRAVENOUS | Status: DC | PRN

## 2024-10-29 MED ORDER — VITAMIN B-12 100 MCG PO TABS
500.0000 ug | ORAL_TABLET | Freq: Every day | ORAL | Status: DC
  Filled 2024-10-29 (×2): qty 5

## 2024-10-29 MED ORDER — SODIUM CHLORIDE 0.9 % IV SOLN
1.0000 g | INTRAVENOUS | Status: DC
  Administered 2024-10-30 – 2024-11-03 (×5): 1 g via INTRAVENOUS
  Filled 2024-10-29 (×5): qty 10

## 2024-10-29 NOTE — H&P (Addendum)
 History and Physical    Mary Villanueva FMW:[id number] DOB: 1956-03-27 DOA: 10/29/2024  PCP: Larnell Hamilton, MD   Patient coming from: Home   Chief Complaint:  Chief Complaint  Patient presents with   Weakness   ED TRIAGE note:  BIB EMS from home r/t increased generalized weakness x3days. Fall 3days ago. Diagnosed with UTI x1 week ago and concerned ABX not working. A&Ox4.     HPI:  Mary Villanueva is a 68 y.o. female with medical history significant of multiple sclerosis wheelchair-bound, history of MS for 40 years initially stable however progressively getting worse over last 10 years, chronic weakness of the bilateral lower extremities, gait abnormality, impaired mobility, spasticity, depression, anemia, anxiety, overactive bladder, CKD stage IIIa, hypothyroidism, and essential hypertension presented to emergency department complaining of increased generalized weakness for 3 days, fall from the wheelchair 3 days ago and UTI 1 week ago concern for progressively worsening UTI as antibiotic is not working.  Patient reported that since the fall from the wheelchair her mobility is more restricted and unable to bear any weight specifically on the left lower extremity.  Have some left ankle swelling and pain.  Patient denies any headache, blurry vision, paresthesia, vertigo, sensory change and hearing change.   ED Course:  At presentation to ED patient is hemodynamically stable. Lab work, CMP showing low bicarb 21, creatinine 1.21 and GFR 48.  Renal function at baseline. CBC unremarkable. Normal lactic acid level. UA showing evidence of UTI. Blood cultures are in process.    Pending MRI of the lumbar spine, thoracic spine, cervical spine and brain. Pending left-sided lower extremity ultrasound to rule out DVT secondary to pain.  Chest x-ray no active disease process. X-ray hip no evidence of fracture or dislocation. X-ray of the left knee no evidence of fracture or  dislocation. X-ray of the left ankle no evidence of fracture or dislocation.  In the ED patient received ceftriaxone  1 g.  Hospitalist consulted for further evaluation management of acute cystitis and generalized weakness.  Need to follow-up with MRI results if he shows any evidence of MS flare need to consult neurology and patient will need treatment with high-dose steroid.  Significant labs in the ED: Lab Orders         Blood culture (routine x 2)         Urine Culture (for pregnant, neutropenic or urologic patients or patients with an indwelling urinary catheter)         CBC with Differential         Urinalysis, Routine w reflex microscopic -Urine, Clean Catch         Comprehensive metabolic panel with GFR         HIV Antibody (routine testing w rflx)         Comprehensive metabolic panel         CBC       Review of Systems:  Review of Systems  Constitutional:  Negative for chills and fever.  Respiratory:  Negative for cough, sputum production and shortness of breath.   Cardiovascular:  Negative for chest pain, palpitations and leg swelling.  Gastrointestinal:  Negative for heartburn and nausea.  Genitourinary:  Positive for dysuria, frequency and urgency. Negative for flank pain and hematuria.  Musculoskeletal:  Negative for back pain, falls, joint pain, myalgias and neck pain.  Neurological:  Positive for weakness. Negative for dizziness, tingling, tremors, sensory change, speech change, focal weakness, seizures, loss of consciousness and headaches.  Generalized weakness  Psychiatric/Behavioral:  The patient is not nervous/anxious.     Past Medical History:  Diagnosis Date   Abdominal pain 05/2018   Depression    High cholesterol    Hypercholesteremia    Hypertension    MS (multiple sclerosis)     Past Surgical History:  Procedure Laterality Date   BREAST REDUCTION SURGERY  1998     reports that she has quit smoking. Her smoking use included cigarettes. She  has never used smokeless tobacco. She reports current alcohol use of about 1.0 standard drink of alcohol per week. She reports that she does not use drugs.  No Known Allergies  Family History  Problem Relation Age of Onset   Stroke Mother    Heart disease Father    Diabetes Sister    Diabetes Brother     Prior to Admission medications   Medication Sig Start Date End Date Taking? Authorizing Provider  baclofen  (LIORESAL ) 10 MG tablet Take 1 tablet (10 mg total) by mouth 3 (three) times daily. 04/29/22   Onita Duos, MD  baclofen  (LIORESAL ) 10 MG tablet TAKE 1 TABLET BY MOUTH 3 TIMES  DAILY 03/18/24   Onita Duos, MD  Biotin  10 MG CAPS Take 10 mg by mouth every morning.     [provider]  buPROPion  (WELLBUTRIN  SR) 150 MG 12 hr tablet Take 150 mg by mouth daily.  11/10/14   [provider]  calcium  carbonate (OS-CAL) 600 MG TABS Take 600 mg by mouth 2 (two) times daily with a meal.    [provider]  cephALEXin  (KEFLEX ) 500 MG capsule Take 1 capsule (500 mg total) by mouth 2 (two) times daily. 10/28/24   Towana Ozell BROCKS, MD  Cholecalciferol  (VITAMIN D -3) 1000 UNITS CAPS Take 1,000 Units by mouth 2 (two) times daily.    [provider]  FLUoxetine  (PROZAC ) 40 MG capsule TAKE 1 CAPSULE BY MOUTH 1  TIME DAILY WITH 20 MG  CAPSULE Patient taking differently: Take 40 mg by mouth daily. 10/30/18   Onita Duos, MD  Multiple Vitamins-Minerals (CENTRUM SILVER 50+WOMEN PO) Take 1 capsule by mouth daily.    [provider]  Omega-3 Fatty Acids (FISH OIL) 1200 MG CAPS Take 1,200 mg by mouth daily.    [provider]  oxybutynin  (DITROPAN -XL) 10 MG 24 hr tablet Take 10 mg by mouth at bedtime.  07/14/14   [provider]  Probiotic Product (PROBIOTIC DAILY PO) Take 1 capsule by mouth daily.    [provider]  rosuvastatin  (CRESTOR ) 20 MG tablet Take 20 mg by mouth at bedtime. 03/19/22   [provider]  terbinafine (LAMISIL) 250  MG tablet Take 250 mg by mouth daily. 03/19/22   [provider]  tiZANidine  (ZANAFLEX ) 4 MG tablet Take 1 tablet (4 mg total) by mouth at bedtime. 04/29/22   Onita Duos, MD  vitamin B-12 (CYANOCOBALAMIN ) 500 MCG tablet Take 500 mcg by mouth daily.    [provider]     Physical Exam: Vitals:   10/29/24 1727 10/29/24 1728 10/29/24 1730  BP:   (!) 116/50  Pulse:   79  Resp:   18  Temp: 98.6 F (37 C)    TempSrc: Oral    SpO2:   99%  Weight:  65.8 kg     Physical Exam Vitals and nursing note reviewed.  Constitutional:      General: She is not in acute distress.    Appearance: She is not ill-appearing.  HENT:     Head: Normocephalic.     Nose: Nose normal.     Mouth/Throat:     Mouth: Mucous membranes are moist.  Eyes:     Pupils: Pupils are equal, round, and reactive to light.  Cardiovascular:     Rate and Rhythm: Normal rate and regular rhythm.     Pulses: Normal pulses.     Heart sounds: Normal heart sounds.  Pulmonary:     Effort: Pulmonary effort is normal.     Breath sounds: Normal breath sounds.  Abdominal:     General: Bowel sounds are normal. There is no distension.  Musculoskeletal:        General: Normal range of motion.     Cervical back: Neck supple.  Skin:    Capillary Refill: Capillary refill takes less than 2 seconds.  Neurological:     Mental Status: She is oriented to person, place, and time. Mental status is at baseline.     Cranial Nerves: No cranial nerve deficit.     Sensory: No sensory deficit.     Motor: Weakness present.     Coordination: Coordination normal.     Gait: Gait abnormal.     Deep Tendon Reflexes: Reflexes normal.    Labs on Admission: I have personally reviewed following labs and imaging studies.  CBC: Recent Labs  Lab 10/28/24 1830 10/29/24 1807  WBC 5.5 6.3  NEUTROABS 4.0 4.7  HGB 12.1 12.0  HCT 36.4 36.3  MCV 93.6 94.5  PLT 220 225   Basic Metabolic Panel: Recent Labs  Lab 10/28/24 1830  10/29/24 1953  NA 137 140  K 4.6 4.4  CL 105 106  CO2 25 21*  GLUCOSE 91 86  BUN 19 22  CREATININE 1.18* 1.23*  CALCIUM  8.1* 8.5*   GFR: Estimated Creatinine Clearance: 36.5 mL/min (A) (by C-G formula based on SCr of 1.23 mg/dL (H)). Liver Function Tests: Recent Labs  Lab 10/29/24 1953  AST 30  ALT 41  ALKPHOS 58  BILITOT 0.3  PROT 5.9*  ALBUMIN 3.1*   No results for input(s): LIPASE, AMYLASE in the last 168 hours. No results for input(s): AMMONIA in the last 168 hours. Coagulation Profile: Recent Labs  Lab 10/28/24 1830  INR 1.0   Cardiac Enzymes: No results for input(s): CKTOTAL, CKMB, CKMBINDEX, TROPONINI, TROPONINIHS in the last 168 hours. BNP (last 3 results) No results for input(s): BNP in the last 8760 hours. HbA1C: No results for input(s): HGBA1C in the last 72 hours. CBG: No results for input(s): GLUCAP in the last 168 hours. Lipid Profile: No results for input(s): CHOL, HDL, LDLCALC, TRIG, CHOLHDL, LDLDIRECT in the last 72 hours. Thyroid Function Tests: No results for input(s): TSH, T4TOTAL, FREET4, T3FREE, THYROIDAB in the last 72 hours. Anemia Panel: No results for input(s): VITAMINB12, FOLATE, FERRITIN, TIBC, IRON, RETICCTPCT in the last 72 hours. Urine analysis:    Component Value Date/Time   COLORURINE YELLOW 10/29/2024 1728   APPEARANCEUR CLEAR 10/29/2024 1728   APPEARANCEUR Clear 04/29/2022 1259   LABSPEC 1.015 10/29/2024 1728   PHURINE 7.0 10/29/2024 1728   GLUCOSEU NEGATIVE 10/29/2024 1728   HGBUR NEGATIVE 10/29/2024 1728   BILIRUBINUR NEGATIVE 10/29/2024 1728   BILIRUBINUR Negative 04/29/2022 1259   KETONESUR NEGATIVE 10/29/2024 1728   PROTEINUR NEGATIVE 10/29/2024 1728   NITRITE POSITIVE (A) 10/29/2024 1728   LEUKOCYTESUR NEGATIVE 10/29/2024 1728    Radiological Exams on Admission: I have personally reviewed images MR Cervical Spine W and Wo Contrast Result  Date:  10/29/2024 EXAM: MRI CERVICAL SPINE WITH AND WITHOUT CONTRAST 10/29/2024 09:36:00 PM TECHNIQUE: Multiplanar multisequence MRI of the cervical spine was performed without and with the administration of intravenous contrast. COMPARISON: MRI cervical spine 05/16/2014 CLINICAL HISTORY: Multiple sclerosis (MS) FINDINGS: Motion limited study. Within this limitation: BONES AND ALIGNMENT: Grade 1 anterolisthesis of C4 on C5. Normal vertebral body heights. Marrow signal is unremarkable. No abnormal enhancement. SPINAL CORD: No substantial change in multiple short segment T2 hyperintensities in the cervical cord. No convincing new lesions or enhancing lesions. SOFT TISSUES: Approximately 2.1 cm left thyroid nodule. C2-C3: No significant disc herniation. No spinal canal stenosis or neural foraminal narrowing. C3-C4: Disc osteophyte complex with ligamentum flavum thickening. Resulting progressive moderate to severe canal stenosis. Left greater than right facet and uncovertebral hypertrophy. Resulting progressive moderate to severe left and moderate right foraminal stenosis. C4-C5: Small posterior disc osteophyte complex with bilateral facet and uncovertebral hypertrophy. Resulting moderate to severe left and moderate right foraminal stenosis. Patent canal. C5-C6: Left greater than right facet and uncovertebral hypertrophy. Moderate left foraminal stenosis. Patent canal and right foramen. C6-C7: Left greater than right facet and uncovertebral hypertrophy. Mild left foraminal stenosis. Patent canal and right foramen. C7-T1: No significant disc herniation. No spinal canal stenosis or neural foraminal narrowing. IMPRESSION: 1. No substantial change in multiple short segment T2 hyperintensities in the cervical cord. No convincing new or enhancing lesions. 2. At C3-C4, progressive moderate to severe canal and left foraminal stenosis. Moderate right foraminal stenosis. 3. At C4-C5, moderate to severe left and moderate right foraminal  stenosis. 4. At C5-C6, moderate left foraminal stenosis. 5. Approximately 2.1 cm left thyroid nodule. Recommend thyroid ultrasound. Electronically signed by: Gilmore Molt MD 10/29/2024 10:22 PM EST RP Workstation: [id number]   MR Lumbar Spine W Wo Contrast Result Date: 10/29/2024 EXAM: MRI LUMBAR SPINE 10/29/2024 09:37:00 PM TECHNIQUE: Multiplanar multisequence MRI of the lumbar spine was performed without or with the administration of intravenous contrast. COMPARISON: None available. CLINICAL HISTORY: Multiple sclerosis (MS) FINDINGS: Motion limited study. BONES AND ALIGNMENT: Normal alignment. Normal vertebral body heights. Bone marrow signal is unremarkable. SPINAL CORD: The conus terminates normally. Normal appearance of the conus. No abnormal enhancement. SOFT TISSUES: No paraspinal mass. L1-L2: No significant disc herniation. No spinal canal stenosis or neural foraminal narrowing. L2-L3: Mild disc bulge with prominent dorsal epidural fat. Bilateral facet arthropathy. Resulting mild canal stenosis. Patent foramina. L3-L4: Small bilateral foraminal disc protrusions. Prominent dorsal epidural fat. Bilateral facet arthropathy. No significant canal or foraminal stenosis. L4-L5: Mild disc bulging. Bilateral facet arthropathy. No significant canal or foraminal stenosis. L5-S1: Mild disc bulging. Bilateral facet arthropathy. No significant canal or foraminal stenosis. IMPRESSION: 1. Normal appearance of the conus. No abnormal enhancement. 2. Mild canal stenosis at L2-L3. Electronically signed by: Gilmore Molt MD 10/29/2024 10:07 PM EST RP Workstation: [id number]   MR THORACIC SPINE W WO CONTRAST Result Date: 10/29/2024 EXAM: MRI THORACIC SPINE WITH AND WITHOUT INTRAVENOUS CONTRAST 10/29/2024 09:37:00 PM TECHNIQUE: Multiplanar multisequence MRI of the thoracic spine was performed with and without the administration of intravenous contrast. COMPARISON: None available. CLINICAL HISTORY: Multiple sclerosis  (MS) Multiple sclerosis (MS) FINDINGS: BONES AND ALIGNMENT: Exaggerated kyphosis. No substantial sagittal subluxation. Normal vertebral body heights. Bone marrow signal is unremarkable. No abnormal enhancement. SPINAL CORD: Short segment T2 hyperintense lesion at the cervicothoracic junction, compatible with prior demyelination. Suspect a few other more subtle lesions in the thoracic cord, including on the left at T4-T5 and centrally at T8-T9 .motion limits assessment  of the cord. No abnormal enhancement. SOFT TISSUES: Approximately 2.1 cm left thyroid nodule. DEGENERATIVE CHANGES: No significant canal stenosis. At T9-T10 , moderate to severe right and mild left foraminal stenosis. At T10-T11, moderate right foraminal stenosis tune At T7-T8 and T8-T9, mild to moderate bilateral foraminal stenosis. IMPRESSION: 1. Multiple short-segment T2 hyperintensities in the cord compatible with chronic demyelination. No enhancement to suggest active demyelination. No priors of the thoracic spine. 2. At T9-T10, moderate to severe right foraminal stenosis. 3. At T10-T11, moderate right foraminal stenosis. 4. No significant canal stenosis. 5. Approximately 2.1 cm left thyroid nodule. Recommend thyroid ultrasound. Electronically signed by: Gilmore Molt MD 10/29/2024 10:02 PM EST RP Workstation: [id number]   MR Brain W and Wo Contrast Result Date: 10/29/2024 EXAM: MRI BRAIN WITH AND WITHOUT CONTRAST 10/29/2024 09:36:00 PM TECHNIQUE: Multiplanar multisequence MRI of the head/brain was performed with and without the administration of intravenous contrast. COMPARISON: MRI head 11/04/2018 CLINICAL HISTORY: Multiple sclerosis (MS) FINDINGS: BRAIN AND VENTRICLES: No acute infarct. No acute intracranial hemorrhage. No mass effect or midline shift. No hydrocephalus. No substantial change in age-advanced T2 hyperintensities in the white matter, many of which are oriented perpendicular to the lateral ventricles. No new or enhancing  lesions identified. Normal flow voids. No mass or abnormal enhancement. ORBITS: No acute abnormality. SINUSES: No acute abnormality. BONES AND SOFT TISSUES: No acute soft tissue abnormality. IMPRESSION: 1. Similar age-advanced white matter T2 hyperintensities, compatible with known multiple sclerosis. No new or enhancing lesions identified. 2. No acute intracranial abnormality. Electronically signed by: Gilmore Molt MD 10/29/2024 09:47 PM EST RP Workstation: [id number]   DG Knee Complete 4 Views Left Result Date: 10/28/2024 EXAM: 4 VIEW(S) XRAY OF THE LEFT KNEE 10/28/2024 07:20:00 PM COMPARISON: None available. CLINICAL HISTORY: fall injury FINDINGS: BONES AND JOINTS: No acute fracture. No malalignment. No significant joint effusion. Mild degenerative changes of the medial compartment with osteophyte formation. SOFT TISSUES: The soft tissues are unremarkable. IMPRESSION: 1. No acute fracture or dislocation. Electronically signed by: Norman Gatlin MD 10/28/2024 07:49 PM EST RP Workstation: [id number]   DG Ankle Complete Left Result Date: 10/28/2024 EXAM: 3 OR MORE VIEW(S) XRAY OF THE LEFT ANKLE 10/28/2024 07:20:00 PM CLINICAL HISTORY: fall injury COMPARISON: None available. FINDINGS: BONES AND JOINTS: No acute fracture. No dislocation. Cavovarus deformity of the left foot. Hammertoes. SOFT TISSUES: Subcutaneous soft tissue edema. IMPRESSION: 1. No acute fracture or dislocation. Electronically signed by: Norman Gatlin MD 10/28/2024 07:49 PM EST RP Workstation: [id number]   DG Hip Unilat With Pelvis 2-3 Views Left Result Date: 10/28/2024 EXAM: 2 OR MORE VIEW(S) XRAY OF THE HIP 10/28/2024 07:20:00 PM COMPARISON: None available. CLINICAL HISTORY: fall injury FINDINGS: BONES AND JOINTS: No acute fracture. No malalignment. SOFT TISSUES: Vascular calcification. IMPRESSION: 1. No acute fracture or dislocation. Electronically signed by: Norman Gatlin MD 10/28/2024 07:46 PM EST RP Workstation: [id number]   DG  Chest 1 View Result Date: 10/28/2024 EXAM: 1 VIEW(S) XRAY OF THE CHEST 10/28/2024 07:20:00 PM COMPARISON: None available. CLINICAL HISTORY: fall injury FINDINGS: LUNGS AND PLEURA: Elevated right hemidiaphragm. Right basilar atelectasis. Otherwise no focal pulmonary opacity. No pleural effusion. No pneumothorax. HEART AND MEDIASTINUM: Aortic arch atherosclerosis. No acute abnormality of the cardiac silhouette. BONES AND SOFT TISSUES: No acute osseous abnormality. IMPRESSION: 1. No acute process. Electronically signed by: Norman Gatlin MD 10/28/2024 07:45 PM EST RP Workstation: [id number]    Assessment/Plan: Principal Problem:   Acute cystitis Active Problems:   Lower extremity weakness   History of multiple sclerosis (HCC)  Essential hypertension   Gait abnormality   Hypothyroidism   Wheelchair dependence   History of depression   Overactive bladder   CKD (chronic kidney disease) stage 3, GFR 30-59 ml/min (HCC)    Assessment and Plan: Acute cystitis -Presented to emergency department complaining of recurrent fall, worsening bilateral lower extremity weakness in the setting of recent UTI and no improvement of UTI symptoms with oral antibiotics.  Presentation to ED patient is hemodynamically stable.  Afebrile. -Lab work, CMP showing low bicarb 21, creatinine 1.21 and GFR 48.  Renal function at baseline. CBC unremarkable. Normal lactic acid level. UA showing evidence of UTI. Blood cultures are in process.  -In the ED patient received ceftriaxone  1 g. -Continue treat with IV ceftriaxone  1 g daily and obtaining urine cultures.  Change antibiotic accordingly based on culture result.   History multiple sclerosis History of chronic lower extremity weakness wheelchair-bound at baseline Recent history of recurrent fall due to bilateral lower extremity weakness History of gait abnormality Wheelchair dependence Chronic muscle spasticity -Presenting with complaining of worsening bilateral  extremities weakness, frequent fall while transferring wheelchair to bed.  Patient does not have any blurry vision, headache, ataxia, limb paresthesia, chest pain, abdominal pain, shortness of breath, ophthalmoplegia, vertigo, hearing loss, sensory change hyperreflexia. -In the ED extensive imaging has been ordered by EDP. -Pending MRI of the lumbar spine, thoracic spine, cervical spine and brain. -Pending left-sided lower extremity ultrasound to rule out DVT secondary to pain. - Chest x-ray no active disease process. X-ray hip no evidence of fracture or dislocation. X-ray of the left knee no evidence of fracture or dislocation. X-ray of the left ankle no evidence of fracture or dislocation. -If MRI shows any abnormality or MS flare we will start high-dose steroid and need to consult neurology. -Continue baclofen  10 mg 3 times daily and tizanidine  at bedtime. -If MRI shows no evidence of MS flare will de-escalate progressive unit to telemetry bed. Addendum - MRI brain no acute intra-abdominal team, no new or enhancing lesion.Similar age-advanced white matter T2 hyperintensities, compatible with known multiple sclerosis. -MRI cervical spine, thoracic spine and lumbar spine no evidence of abnormal enhancement. -At this time there is no concern for MS flare. -Consulted PT and OT for evaluation of weakness and balance. De-escalating level of care progress to telemetry bed.   History of hypothyroidism -Pharmacy verification of levothyroxine  dose  History of depression -Continue Prozac   History of overactive bladder -Continue In-N-Out cath as needed and oxybutynin .  History of CKD stage IIIa -Stable renal function.  History of essential hypertension -Pending pharmacy verification of home blood pressure regimen.  DVT prophylaxis:  SCDs.  And Lovenox .   Code Status:  Full Code Diet: Heart healthy diet Family Communication:  Family was present at bedside, at the time of interview.  Opportunity was given to ask question and all questions were answered satisfactorily.  Disposition Plan:  Consults:   Admission status:   Inpatient, progressive unit  Severity of Illness: The appropriate patient status for this patient is INPATIENT. Inpatient status is judged to be reasonable and necessary in order to provide the required intensity of service to ensure the patient's safety. The patient's presenting symptoms, physical exam findings, and initial radiographic and laboratory data in the context of their chronic comorbidities is felt to place them at high risk for further clinical deterioration. Furthermore, it is not anticipated that the patient will be medically stable for discharge from the hospital within 2 midnights of admission.   * I  certify that at the point of admission it is my clinical judgment that the patient will require inpatient hospital care spanning beyond 2 midnights from the point of admission due to high intensity of service, high risk for further deterioration and high frequency of surveillance required.DEWAINE   Maple Odaniel, MD [facility]  How to contact the [facility] Attending or Consulting provider 7A - 7P or covering provider during after hours 7P -7A, for this patient.  Check the care team in [facility] and look for a) attending/consulting [facility] provider listed and b) the [facility] team listed Log into [email or web address] and use [facility] universal password to access. If you do not have the password, please contact the hospital operator. Locate the [facility] provider you are looking for under [facility] and page to a number that you can be directly reached. If you still have difficulty reaching the provider, please page the [facility] (Director on Call) for the Hospitalists listed on amion for assistance.  10/29/2024, 10:28 PM

## 2024-10-29 NOTE — ED Provider Notes (Signed)
 [place] EMERGENCY DEPARTMENT AT [facility] Provider Note   CSN: [id number] Arrival date & time: 10/29/24  1719     Patient presents with: Weakness   Mary Villanueva is a 68 y.o. female.   68 yo F with a chief complaints of leg weakness.  She tells me that she has had leg weakness for some time usually left worse than right but is gotten the point where she cannot stand up.  This caused her to fall yesterday morning.  She had called her neurologist because she has a history of MS and they encouraged her to come to the ED for evaluation.  She was found to have a urinary tract infection and was offered further evaluation in the emergency department but the patient at that time would want to try and go home.  Unfortunately she has been unable to stand and she is worried that she will do well at home and came back to the ED for evaluation.  She is having some edema to the left lower extremity which she says is new for her as well.  She denies cough congestion or fever.  Denies abdominal pain.  Denies nausea vomiting or diarrhea.  Feels like she has been eating and drinking normally.   Weakness      Prior to Admission medications   Medication Sig Start Date End Date Taking? Authorizing Provider  baclofen  (LIORESAL ) 10 MG tablet Take 1 tablet (10 mg total) by mouth 3 (three) times daily. 04/29/22   Onita Duos, MD  baclofen  (LIORESAL ) 10 MG tablet TAKE 1 TABLET BY MOUTH 3 TIMES  DAILY 03/18/24   Onita Duos, MD  Biotin  10 MG CAPS Take 10 mg by mouth every morning.     [provider]  buPROPion  (WELLBUTRIN  SR) 150 MG 12 hr tablet Take 150 mg by mouth daily.  11/10/14   [provider]  calcium  carbonate (OS-CAL) 600 MG TABS Take 600 mg by mouth 2 (two) times daily with a meal.    [provider]  cephALEXin  (KEFLEX ) 500 MG capsule Take 1 capsule (500 mg total) by mouth 2 (two) times daily. 10/28/24   Towana Ozell BROCKS, MD  Cholecalciferol  (VITAMIN D -3)  1000 UNITS CAPS Take 1,000 Units by mouth 2 (two) times daily.    [provider]  FLUoxetine  (PROZAC ) 40 MG capsule TAKE 1 CAPSULE BY MOUTH 1  TIME DAILY WITH 20 MG  CAPSULE Patient taking differently: Take 40 mg by mouth daily. 10/30/18   Onita Duos, MD  Multiple Vitamins-Minerals (CENTRUM SILVER 50+WOMEN PO) Take 1 capsule by mouth daily.    [provider]  Omega-3 Fatty Acids (FISH OIL) 1200 MG CAPS Take 1,200 mg by mouth daily.    [provider]  oxybutynin  (DITROPAN -XL) 10 MG 24 hr tablet Take 10 mg by mouth at bedtime.  07/14/14   [provider]  Probiotic Product (PROBIOTIC DAILY PO) Take 1 capsule by mouth daily.    [provider]  rosuvastatin  (CRESTOR ) 20 MG tablet Take 20 mg by mouth at bedtime. 03/19/22   [provider]  terbinafine (LAMISIL) 250 MG tablet Take 250 mg by mouth daily. 03/19/22   [provider]  tiZANidine  (ZANAFLEX ) 4 MG tablet Take 1 tablet (4 mg total) by mouth at bedtime. 04/29/22   Onita Duos, MD  vitamin B-12 (CYANOCOBALAMIN ) 500 MCG tablet Take 500 mcg by mouth daily.    [provider]    Allergies: Patient has no known  allergies.    Review of Systems  Neurological:  Positive for weakness.    Updated Vital Signs BP (!) 116/50   Pulse 79   Temp 98.6 F (37 C) (Oral)   Resp 18   Wt 65.8 kg   SpO2 99%   BMI 28.32 kg/m   Physical Exam Vitals and nursing note reviewed.  Constitutional:      General: She is not in acute distress.    Appearance: She is well-developed. She is not diaphoretic.  HENT:     Head: Normocephalic and atraumatic.  Eyes:     Pupils: Pupils are equal, round, and reactive to light.  Cardiovascular:     Rate and Rhythm: Normal rate and regular rhythm.     Heart sounds: No murmur heard.    No friction rub. No gallop.  Pulmonary:     Effort: Pulmonary effort is normal.     Breath sounds: No wheezing or rales.  Abdominal:     General: There is no  distension.     Palpations: Abdomen is soft.     Tenderness: There is no abdominal tenderness.  Musculoskeletal:        General: No tenderness.     Cervical back: Normal range of motion and neck supple.     Left lower leg: Edema present.     Comments: 2-3+ edema to the left lower extremity up to the thigh.  Skin:    General: Skin is warm and dry.  Neurological:     Mental Status: She is alert and oriented to person, place, and time.  Psychiatric:        Behavior: Behavior normal.     (all labs ordered are listed, but only abnormal results are displayed) Labs Reviewed  CBC WITH DIFFERENTIAL/PLATELET - Abnormal; Notable for the following components:      Result Value   RBC 3.84 (*)    All other components within normal limits  URINALYSIS, ROUTINE W REFLEX MICROSCOPIC - Abnormal; Notable for the following components:   Nitrite POSITIVE (*)    Bacteria, UA RARE (*)    All other components within normal limits  COMPREHENSIVE METABOLIC PANEL WITH GFR - Abnormal; Notable for the following components:   CO2 21 (*)    Creatinine, Ser 1.23 (*)    Calcium  8.5 (*)    Total Protein 5.9 (*)    Albumin 3.1 (*)    GFR, Estimated 48 (*)    All other components within normal limits  CULTURE, BLOOD (ROUTINE X 2)  CULTURE, BLOOD (ROUTINE X 2)  URINE CULTURE  HIV ANTIBODY (ROUTINE TESTING W REFLEX)  COMPREHENSIVE METABOLIC PANEL WITH GFR  CBC  I-STAT CG4 LACTIC ACID, ED    EKG: None  Radiology: MR Cervical Spine W and Wo Contrast Result Date: 10/29/2024 EXAM: MRI CERVICAL SPINE WITH AND WITHOUT CONTRAST 10/29/2024 09:36:00 PM TECHNIQUE: Multiplanar multisequence MRI of the cervical spine was performed without and with the administration of intravenous contrast. COMPARISON: MRI cervical spine 05/16/2014 CLINICAL HISTORY: Multiple sclerosis (MS) FINDINGS: Motion limited study. Within this limitation: BONES AND ALIGNMENT: Grade 1 anterolisthesis of C4 on C5. Normal vertebral body heights.  Marrow signal is unremarkable. No abnormal enhancement. SPINAL CORD: No substantial change in multiple short segment T2 hyperintensities in the cervical cord. No convincing new lesions or enhancing lesions. SOFT TISSUES: Approximately 2.1 cm left thyroid nodule. C2-C3: No significant disc herniation. No spinal canal stenosis or neural foraminal narrowing. C3-C4: Disc osteophyte complex with ligamentum flavum thickening. Resulting  progressive moderate to severe canal stenosis. Left greater than right facet and uncovertebral hypertrophy. Resulting progressive moderate to severe left and moderate right foraminal stenosis. C4-C5: Small posterior disc osteophyte complex with bilateral facet and uncovertebral hypertrophy. Resulting moderate to severe left and moderate right foraminal stenosis. Patent canal. C5-C6: Left greater than right facet and uncovertebral hypertrophy. Moderate left foraminal stenosis. Patent canal and right foramen. C6-C7: Left greater than right facet and uncovertebral hypertrophy. Mild left foraminal stenosis. Patent canal and right foramen. C7-T1: No significant disc herniation. No spinal canal stenosis or neural foraminal narrowing. IMPRESSION: 1. No substantial change in multiple short segment T2 hyperintensities in the cervical cord. No convincing new or enhancing lesions. 2. At C3-C4, progressive moderate to severe canal and left foraminal stenosis. Moderate right foraminal stenosis. 3. At C4-C5, moderate to severe left and moderate right foraminal stenosis. 4. At C5-C6, moderate left foraminal stenosis. 5. Approximately 2.1 cm left thyroid nodule. Recommend thyroid ultrasound. Electronically signed by: Gilmore Molt MD 10/29/2024 10:22 PM EST RP Workstation: [id number]   MR Lumbar Spine W Wo Contrast Result Date: 10/29/2024 EXAM: MRI LUMBAR SPINE 10/29/2024 09:37:00 PM TECHNIQUE: Multiplanar multisequence MRI of the lumbar spine was performed without or with the administration of  intravenous contrast. COMPARISON: None available. CLINICAL HISTORY: Multiple sclerosis (MS) FINDINGS: Motion limited study. BONES AND ALIGNMENT: Normal alignment. Normal vertebral body heights. Bone marrow signal is unremarkable. SPINAL CORD: The conus terminates normally. Normal appearance of the conus. No abnormal enhancement. SOFT TISSUES: No paraspinal mass. L1-L2: No significant disc herniation. No spinal canal stenosis or neural foraminal narrowing. L2-L3: Mild disc bulge with prominent dorsal epidural fat. Bilateral facet arthropathy. Resulting mild canal stenosis. Patent foramina. L3-L4: Small bilateral foraminal disc protrusions. Prominent dorsal epidural fat. Bilateral facet arthropathy. No significant canal or foraminal stenosis. L4-L5: Mild disc bulging. Bilateral facet arthropathy. No significant canal or foraminal stenosis. L5-S1: Mild disc bulging. Bilateral facet arthropathy. No significant canal or foraminal stenosis. IMPRESSION: 1. Normal appearance of the conus. No abnormal enhancement. 2. Mild canal stenosis at L2-L3. Electronically signed by: Gilmore Molt MD 10/29/2024 10:07 PM EST RP Workstation: [id number]   MR THORACIC SPINE W WO CONTRAST Result Date: 10/29/2024 EXAM: MRI THORACIC SPINE WITH AND WITHOUT INTRAVENOUS CONTRAST 10/29/2024 09:37:00 PM TECHNIQUE: Multiplanar multisequence MRI of the thoracic spine was performed with and without the administration of intravenous contrast. COMPARISON: None available. CLINICAL HISTORY: Multiple sclerosis (MS) Multiple sclerosis (MS) FINDINGS: BONES AND ALIGNMENT: Exaggerated kyphosis. No substantial sagittal subluxation. Normal vertebral body heights. Bone marrow signal is unremarkable. No abnormal enhancement. SPINAL CORD: Short segment T2 hyperintense lesion at the cervicothoracic junction, compatible with prior demyelination. Suspect a few other more subtle lesions in the thoracic cord, including on the left at T4-T5 and centrally at T8-T9  .motion limits assessment of the cord. No abnormal enhancement. SOFT TISSUES: Approximately 2.1 cm left thyroid nodule. DEGENERATIVE CHANGES: No significant canal stenosis. At T9-T10 , moderate to severe right and mild left foraminal stenosis. At T10-T11, moderate right foraminal stenosis tune At T7-T8 and T8-T9, mild to moderate bilateral foraminal stenosis. IMPRESSION: 1. Multiple short-segment T2 hyperintensities in the cord compatible with chronic demyelination. No enhancement to suggest active demyelination. No priors of the thoracic spine. 2. At T9-T10, moderate to severe right foraminal stenosis. 3. At T10-T11, moderate right foraminal stenosis. 4. No significant canal stenosis. 5. Approximately 2.1 cm left thyroid nodule. Recommend thyroid ultrasound. Electronically signed by: Gilmore Molt MD 10/29/2024 10:02 PM EST RP Workstation: [id number]   MR Brain  W and Wo Contrast Result Date: 10/29/2024 EXAM: MRI BRAIN WITH AND WITHOUT CONTRAST 10/29/2024 09:36:00 PM TECHNIQUE: Multiplanar multisequence MRI of the head/brain was performed with and without the administration of intravenous contrast. COMPARISON: MRI head 11/04/2018 CLINICAL HISTORY: Multiple sclerosis (MS) FINDINGS: BRAIN AND VENTRICLES: No acute infarct. No acute intracranial hemorrhage. No mass effect or midline shift. No hydrocephalus. No substantial change in age-advanced T2 hyperintensities in the white matter, many of which are oriented perpendicular to the lateral ventricles. No new or enhancing lesions identified. Normal flow voids. No mass or abnormal enhancement. ORBITS: No acute abnormality. SINUSES: No acute abnormality. BONES AND SOFT TISSUES: No acute soft tissue abnormality. IMPRESSION: 1. Similar age-advanced white matter T2 hyperintensities, compatible with known multiple sclerosis. No new or enhancing lesions identified. 2. No acute intracranial abnormality. Electronically signed by: Gilmore Molt MD 10/29/2024 09:47 PM EST RP  Workstation: [id number]   DG Knee Complete 4 Views Left Result Date: 10/28/2024 EXAM: 4 VIEW(S) XRAY OF THE LEFT KNEE 10/28/2024 07:20:00 PM COMPARISON: None available. CLINICAL HISTORY: fall injury FINDINGS: BONES AND JOINTS: No acute fracture. No malalignment. No significant joint effusion. Mild degenerative changes of the medial compartment with osteophyte formation. SOFT TISSUES: The soft tissues are unremarkable. IMPRESSION: 1. No acute fracture or dislocation. Electronically signed by: Norman Gatlin MD 10/28/2024 07:49 PM EST RP Workstation: [id number]   DG Ankle Complete Left Result Date: 10/28/2024 EXAM: 3 OR MORE VIEW(S) XRAY OF THE LEFT ANKLE 10/28/2024 07:20:00 PM CLINICAL HISTORY: fall injury COMPARISON: None available. FINDINGS: BONES AND JOINTS: No acute fracture. No dislocation. Cavovarus deformity of the left foot. Hammertoes. SOFT TISSUES: Subcutaneous soft tissue edema. IMPRESSION: 1. No acute fracture or dislocation. Electronically signed by: Norman Gatlin MD 10/28/2024 07:49 PM EST RP Workstation: [id number]   DG Hip Unilat With Pelvis 2-3 Views Left Result Date: 10/28/2024 EXAM: 2 OR MORE VIEW(S) XRAY OF THE HIP 10/28/2024 07:20:00 PM COMPARISON: None available. CLINICAL HISTORY: fall injury FINDINGS: BONES AND JOINTS: No acute fracture. No malalignment. SOFT TISSUES: Vascular calcification. IMPRESSION: 1. No acute fracture or dislocation. Electronically signed by: Norman Gatlin MD 10/28/2024 07:46 PM EST RP Workstation: [id number]   DG Chest 1 View Result Date: 10/28/2024 EXAM: 1 VIEW(S) XRAY OF THE CHEST 10/28/2024 07:20:00 PM COMPARISON: None available. CLINICAL HISTORY: fall injury FINDINGS: LUNGS AND PLEURA: Elevated right hemidiaphragm. Right basilar atelectasis. Otherwise no focal pulmonary opacity. No pleural effusion. No pneumothorax. HEART AND MEDIASTINUM: Aortic arch atherosclerosis. No acute abnormality of the cardiac silhouette. BONES AND SOFT TISSUES: No acute  osseous abnormality. IMPRESSION: 1. No acute process. Electronically signed by: Norman Gatlin MD 10/28/2024 07:45 PM EST RP Workstation: [id number]     Procedures   Medications Ordered in the ED  multivitamin with minerals tablet 1 tablet (has no administration in time range)  vitamin B-12 (CYANOCOBALAMIN ) tablet 500 mcg (has no administration in time range)  rosuvastatin  (CRESTOR ) tablet 20 mg (20 mg Oral Given 10/29/24 2238)  buPROPion  (WELLBUTRIN  SR) 12 hr tablet 150 mg (has no administration in time range)  FLUoxetine  (PROZAC ) capsule 40 mg (has no administration in time range)  Probiotic Daily CAPS (has no administration in time range)  oxybutynin  (DITROPAN -XL) 24 hr tablet 10 mg (10 mg Oral Given 10/29/24 2238)  baclofen  (LIORESAL ) tablet 10 mg (10 mg Oral Given 10/29/24 2238)  tiZANidine  (ZANAFLEX ) tablet 4 mg (4 mg Oral Given 10/29/24 2238)  cholecalciferol  (VITAMIN D3) 25 MCG (1000 UNIT) tablet 1,000 Units (1,000 Units Oral Given 10/29/24 2238)  sodium chloride  flush (NS)  0.9 % injection 3 mL (has no administration in time range)  sodium chloride  flush (NS) 0.9 % injection 3 mL (has no administration in time range)  sodium chloride  flush (NS) 0.9 % injection 3 mL (has no administration in time range)  0.9 %  sodium chloride  infusion (has no administration in time range)  acetaminophen  (TYLENOL ) tablet 650 mg (has no administration in time range)    Or  acetaminophen  (TYLENOL ) suppository 650 mg (has no administration in time range)  ondansetron  (ZOFRAN ) tablet 4 mg (has no administration in time range)    Or  ondansetron  (ZOFRAN ) injection 4 mg (has no administration in time range)  cefTRIAXone  (ROCEPHIN ) 1 g in sodium chloride  0.9 % 100 mL IVPB (has no administration in time range)  enoxaparin  (LOVENOX ) injection 40 mg (40 mg Subcutaneous Given 10/29/24 2239)  cefTRIAXone  (ROCEPHIN ) 2 g in sodium chloride  0.9 % 100 mL IVPB (0 g Intravenous Stopped 10/29/24 1837)  gadobutrol   (GADAVIST ) 1 MMOL/ML injection 7 mL (7 mLs Intravenous Contrast Given 10/29/24 2121)                                    Medical Decision Making Amount and/or Complexity of Data Reviewed Labs: ordered. Radiology: ordered.  Risk Prescription drug management. Decision regarding hospitalization.   68 yo F with a chief complaints of leg weakness.  Patient has a history of MS and has lower extremity weakness at baseline.  Normally she is able to stand and transfer but has been unable to due to worsening left leg weakness.  She ended up falling yesterday morning.  Came to the emergency department for evaluation.  On record review the patient had imaging blood work UA.  The urine did look concerning for urinary tract infection positive nitrites leukocyte esterase and too numerous to count bacteria.  Will recheck labs here today.  IV fluid bolus.  IV antibiotics.  DVT study of the left lower extremity if able.  I did discuss case with Dr. Vanessa, neuro. to evaluate this for possible MS flare he recommended MRI with and without contrast.  As its mostly left leg he recommended MRI brain through the spine with and without contrast.  If positive thought she would benefit from steroids.  If negative could be weakness just related to her urinary tract infection.  Will discuss with medicine.  The patients results and plan were reviewed and discussed.   Any x-rays performed were independently reviewed by myself.   Differential diagnosis were considered with the presenting HPI.  Medications  multivitamin with minerals tablet 1 tablet (has no administration in time range)  vitamin B-12 (CYANOCOBALAMIN ) tablet 500 mcg (has no administration in time range)  rosuvastatin  (CRESTOR ) tablet 20 mg (20 mg Oral Given 10/29/24 2238)  buPROPion  (WELLBUTRIN  SR) 12 hr tablet 150 mg (has no administration in time range)  FLUoxetine  (PROZAC ) capsule 40 mg (has no administration in time range)  Probiotic Daily  CAPS (has no administration in time range)  oxybutynin  (DITROPAN -XL) 24 hr tablet 10 mg (10 mg Oral Given 10/29/24 2238)  baclofen  (LIORESAL ) tablet 10 mg (10 mg Oral Given 10/29/24 2238)  tiZANidine  (ZANAFLEX ) tablet 4 mg (4 mg Oral Given 10/29/24 2238)  cholecalciferol  (VITAMIN D3) 25 MCG (1000 UNIT) tablet 1,000 Units (1,000 Units Oral Given 10/29/24 2238)  sodium chloride  flush (NS) 0.9 % injection 3 mL (has no administration in time range)  sodium chloride  flush (NS)  0.9 % injection 3 mL (has no administration in time range)  sodium chloride  flush (NS) 0.9 % injection 3 mL (has no administration in time range)  0.9 %  sodium chloride  infusion (has no administration in time range)  acetaminophen  (TYLENOL ) tablet 650 mg (has no administration in time range)    Or  acetaminophen  (TYLENOL ) suppository 650 mg (has no administration in time range)  ondansetron  (ZOFRAN ) tablet 4 mg (has no administration in time range)    Or  ondansetron  (ZOFRAN ) injection 4 mg (has no administration in time range)  cefTRIAXone  (ROCEPHIN ) 1 g in sodium chloride  0.9 % 100 mL IVPB (has no administration in time range)  enoxaparin  (LOVENOX ) injection 40 mg (40 mg Subcutaneous Given 10/29/24 2239)  cefTRIAXone  (ROCEPHIN ) 2 g in sodium chloride  0.9 % 100 mL IVPB (0 g Intravenous Stopped 10/29/24 1837)  gadobutrol  (GADAVIST ) 1 MMOL/ML injection 7 mL (7 mLs Intravenous Contrast Given 10/29/24 2121)    Vitals:   10/29/24 1727 10/29/24 1728 10/29/24 1730  BP:   (!) 116/50  Pulse:   79  Resp:   18  Temp: 98.6 F (37 C)    TempSrc: Oral    SpO2:   99%  Weight:  65.8 kg     Final diagnoses:  Lower urinary tract infectious disease    Admission/ observation were discussed with the admitting physician, patient and/or family and they are comfortable with the plan.       Final diagnoses:  Lower urinary tract infectious disease    ED Discharge Orders     None          Emil Share, DO 10/29/24 2304

## 2024-10-29 NOTE — ED Triage Notes (Signed)
 BIB EMS from home r/t increased generalized weakness x3days. Fall 3days ago. Diagnosed with UTI x1 week ago and concerned ABX not working. A&Ox4.

## 2024-10-30 ENCOUNTER — Inpatient Hospital Stay (HOSPITAL_COMMUNITY)

## 2024-10-30 LAB — COMPREHENSIVE METABOLIC PANEL WITH GFR
ALT: 35 U/L (ref 0–44)
AST: 31 U/L (ref 15–41)
Albumin: 3 g/dL — ABNORMAL LOW (ref 3.5–5.0)
Alkaline Phosphatase: 52 U/L (ref 38–126)
Anion gap: 10 (ref 5–15)
BUN: 17 mg/dL (ref 8–23)
CO2: 20 mmol/L — ABNORMAL LOW (ref 22–32)
Calcium: 7.7 mg/dL — ABNORMAL LOW (ref 8.9–10.3)
Chloride: 107 mmol/L (ref 98–111)
Creatinine, Ser: 1.03 mg/dL — ABNORMAL HIGH (ref 0.44–1.00)
GFR, Estimated: 59 mL/min — ABNORMAL LOW (ref >60)
Glucose, Bld: 80 mg/dL (ref 70–99)
Potassium: 4.1 mmol/L (ref 3.5–5.1)
Sodium: 137 mmol/L (ref 135–145)
Total Bilirubin: 0.5 mg/dL (ref 0.0–1.2)
Total Protein: 5.7 g/dL — ABNORMAL LOW (ref 6.5–8.1)

## 2024-10-30 LAB — CBC
HCT: 37.5 % (ref 36.0–46.0)
Hemoglobin: 12 g/dL (ref 12.0–15.0)
MCH: 31.5 pg (ref 26.0–34.0)
MCHC: 32 g/dL (ref 30.0–36.0)
MCV: 98.4 fL (ref 80.0–100.0)
Platelets: 200 K/uL (ref 150–400)
RBC: 3.81 MIL/uL — ABNORMAL LOW (ref 3.87–5.11)
RDW: 12.7 % (ref 11.5–15.5)
WBC: 5.1 K/uL (ref 4.0–10.5)
nRBC: 0 % (ref 0.0–0.2)

## 2024-10-30 LAB — URINE CULTURE

## 2024-10-30 LAB — HIV ANTIBODY (ROUTINE TESTING W REFLEX): HIV Screen 4th Generation wRfx: NONREACTIVE

## 2024-10-30 MED ORDER — LEVOTHYROXINE SODIUM 50 MCG PO TABS
50.0000 ug | ORAL_TABLET | Freq: Every day | ORAL | Status: DC
  Administered 2024-10-30 – 2024-11-07 (×9): 50 ug via ORAL
  Filled 2024-10-30 (×9): qty 1

## 2024-10-30 MED ORDER — IRBESARTAN 75 MG PO TABS
37.5000 mg | ORAL_TABLET | Freq: Every day | ORAL | Status: DC
  Administered 2024-10-30 – 2024-10-31 (×2): 37.5 mg via ORAL
  Filled 2024-10-30 (×2): qty 0.5

## 2024-10-30 NOTE — Progress Notes (Addendum)
 PROGRESS NOTE    Mary Villanueva  FMW:[id number] DOB: Oct 22, 1956 DOA: 10/29/2024 PCP: Larnell Hamilton, MD    Brief Narrative:  This 68 y.o. female with PMH significant for Multiple sclerosis,  wheelchair-bound, history of MS for 40 years initially stable however progressively getting worse over last 10 years, chronic weakness of the bilateral lower extremities, gait abnormality, impaired mobility, spasticity, depression, anemia, anxiety, overactive bladder, CKD stage IIIa, hypothyroidism, and essential hypertension presented to the ED with progressively worsening generalized weakness for 3 days.  Patient had a fall from the wheelchair 3 days ago and had UTI 1 week ago.  She has been taking antibiotics prescribed by PCP without any improvement.  Patient reports since the fall her mobility is more restricted and She is unable to bear weight specifically on the left lower extremity.  She has developed left ankle swelling and pain.  She was hemodynamically stable in the ED.  UA consistent with UTI.  Patient had MRI, C-spine T-spine and lumbar spine.  MRI > No new or enhancing lesions.  Similar age advanced white matter T2 hyperintensities compatible with known multiple sclerosis.  Patient was admitted for further evaluation and was continued on IV antibiotics.   Assessment & Plan:   Principal Problem:   Acute cystitis Active Problems:   Lower extremity weakness   History of multiple sclerosis (HCC)   Essential hypertension   Gait abnormality   Hypothyroidism   Wheelchair dependence   History of depression   Overactive bladder   CKD (chronic kidney disease) stage 3, GFR 30-59 ml/min (HCC)   Acute cystitis: Patient presented to the ED with recurrent falls, worsening bilateral lower extremity weakness. She was diagnosed with UTI and started on oral antibiotics last week. UA consistent with UTI. Continue IV ceftriaxone . Follow-up blood and urine cultures.  Change antibiotics according  Cultures results.  History multiple sclerosis: History of chronic lower extremity weakness,  wheelchair-bound at baseline. Recent history of recurrent falls due to bilateral lower extremity weakness. History of gait abnormality. Wheelchair dependence Chronic muscle spasticity. She presented with worsening bilateral extremities weakness, frequent falls while transferring  from wheelchair to bed.   Patient denies any blurry vision, headache, ataxia, limb paresthesia, chest pain, abdominal pain, shortness of breath, ophthalmoplegia, vertigo, hearing loss, sensory change hyperreflexia. In the ED extensive imaging has been completed. Chest x-ray >  No active disease process. X-ray hip  > No evidence of fracture or dislocation. X-ray of the left knee >  No evidence of fracture or dislocation. MRI brain > no acute intra cranial abnormality.  No new or enhancing lesion. MRI C-spine ,  thoracic spine and lumbar spine >  No evidence of abnormal enhancement. Continue baclofen  10 mg 3 times daily and tizanidine  at bedtime. At this time there is no concern for MS flare. Obtain PT and OT for evaluation of weakness and balance.  History of hypothyroidism: Continue levothyroxine .  History of depression: Continue Prozac .   History of overactive bladder: Continue In-N-Out cath as needed and oxybutynin .   History of CKD stage IIIa: Stable renal function.  Avoid nephrotoxic medications.   History of essential hypertension Continue irbesartan .  Left ankle swelling: Follow-up venous duplex left lower extremity.  DVT prophylaxis: Lovenox  Code Status: Full code Family Communication: No family at bedside Disposition Plan:    Status is: Inpatient Remains inpatient appropriate because: Admitted for acute cystitis, worsening bilateral lower extremity weakness in the setting of MS. Imaging consistent with no new MS flare.  Consultants:  None  Procedures:  MRI  Antimicrobials:  Anti-infectives  (From admission, onward)    Start     Dose/Rate Route Frequency Ordered Stop   10/30/24 0600  cefTRIAXone  (ROCEPHIN ) 1 g in sodium chloride  0.9 % 100 mL IVPB        1 g 200 mL/hr over 30 Minutes Intravenous Every 24 hours 10/29/24 2056 11/05/24 0559   10/29/24 1730  cefTRIAXone  (ROCEPHIN ) 2 g in sodium chloride  0.9 % 100 mL IVPB        2 g 200 mL/hr over 30 Minutes Intravenous  Once 10/29/24 1728 10/29/24 1837      Subjective: Patient was seen and examined at bedside. Overnight events noted. Patient reports still having urinary symptoms.  She reports weakness is at her baseline.  Objective: Vitals:   10/30/24 0715 10/30/24 0958 10/30/24 1010 10/30/24 1015  BP: (!) 121/58  128/62 (!) 128/57  Pulse: 70  74 74  Resp:   15   Temp:   98.8 F (37.1 C)   TempSrc:   Oral   SpO2: 100%  100% 100%  Weight:      Height:  5' (1.524 m)      Intake/Output Summary (Last 24 hours) at 10/30/2024 1204 Last data filed at 10/30/2024 0733 Gross per 24 hour  Intake 200 ml  Output --  Net 200 ml   Filed Weights   10/29/24 1728  Weight: 65.8 kg    Examination:  General exam: Appears calm and comfortable, not in any acute distress. Respiratory system: CTA Bilaterally. Respiratory effort normal.  RR 16 Cardiovascular system: S1 & S2 heard, RRR. No JVD, murmurs, rubs, gallops or clicks.  Gastrointestinal system: Abdomen is non distended, soft and non tender.  Normal bowel sounds heard. Central nervous system: Alert and oriented x 3. No focal neurological deficits. Extremities: Symmetric 5 x 5 power. Skin: No rashes, lesions or ulcers Psychiatry: Judgement and insight appear normal. Mood & affect appropriate.     Data Reviewed: I have personally reviewed following labs and imaging studies  CBC: Recent Labs  Lab 10/28/24 1830 10/29/24 1807 10/30/24 0500  WBC 5.5 6.3 5.1  NEUTROABS 4.0 4.7  --   HGB 12.1 12.0 12.0  HCT 36.4 36.3 37.5  MCV 93.6 94.5 98.4  PLT 220 225 200   Basic  Metabolic Panel: Recent Labs  Lab 10/28/24 1830 10/29/24 1953 10/30/24 0500  NA 137 140 137  K 4.6 4.4 4.1  CL 105 106 107  CO2 25 21* 20*  GLUCOSE 91 86 80  BUN 19 22 17   CREATININE 1.18* 1.23* 1.03*  CALCIUM  8.1* 8.5* 7.7*   GFR: Estimated Creatinine Clearance: 43.6 mL/min (A) (by C-G formula based on SCr of 1.03 mg/dL (H)). Liver Function Tests: Recent Labs  Lab 10/29/24 1953 10/30/24 0500  AST 30 31  ALT 41 35  ALKPHOS 58 52  BILITOT 0.3 0.5  PROT 5.9* 5.7*  ALBUMIN 3.1* 3.0*   No results for input(s): LIPASE, AMYLASE in the last 168 hours. No results for input(s): AMMONIA in the last 168 hours. Coagulation Profile: Recent Labs  Lab 10/28/24 1830  INR 1.0   Cardiac Enzymes: No results for input(s): CKTOTAL, CKMB, CKMBINDEX, TROPONINI in the last 168 hours. BNP (last 3 results) No results for input(s): PROBNP in the last 8760 hours. HbA1C: No results for input(s): HGBA1C in the last 72 hours. CBG: No results for input(s): GLUCAP in the last 168 hours. Lipid Profile: No results for input(s): CHOL, HDL, LDLCALC, TRIG, CHOLHDL,  LDLDIRECT in the last 72 hours. Thyroid Function Tests: No results for input(s): TSH, T4TOTAL, FREET4, T3FREE, THYROIDAB in the last 72 hours. Anemia Panel: No results for input(s): VITAMINB12, FOLATE, FERRITIN, TIBC, IRON, RETICCTPCT in the last 72 hours. Sepsis Labs: Recent Labs  Lab 10/29/24 1804  LATICACIDVEN 0.8    Recent Results (from the past 240 hours)  Blood culture (routine x 2)     Status: None (Preliminary result)   Collection Time: 10/29/24  5:14 PM   Specimen: BLOOD  Result Value Ref Range Status   Specimen Description BLOOD SITE NOT SPECIFIED  Final   Special Requests   Final    BOTTLES DRAWN AEROBIC AND ANAEROBIC Blood Culture results may not be optimal due to an inadequate volume of blood received in culture bottles   Culture   Final    NO GROWTH < 24  HOURS Performed at [facility], [address], [place], [place] [address]    Report Status PENDING  Incomplete  Blood culture (routine x 2)     Status: None (Preliminary result)   Collection Time: 10/29/24  5:14 PM   Specimen: BLOOD  Result Value Ref Range Status   Specimen Description BLOOD SITE NOT SPECIFIED  Final   Special Requests   Final    BOTTLES DRAWN AEROBIC AND ANAEROBIC Blood Culture results may not be optimal due to an inadequate volume of blood received in culture bottles   Culture   Final    NO GROWTH < 24 HOURS Performed at [facility], [address] [place], [place] [address]    Report Status PENDING  Incomplete    Radiology Studies: MR Cervical Spine W and Wo Contrast Result Date: 10/29/2024 EXAM: MRI CERVICAL SPINE WITH AND WITHOUT CONTRAST 10/29/2024 09:36:00 PM TECHNIQUE: Multiplanar multisequence MRI of the cervical spine was performed without and with the administration of intravenous contrast. COMPARISON: MRI cervical spine 05/16/2014 CLINICAL HISTORY: Multiple sclerosis (MS) FINDINGS: Motion limited study. Within this limitation: BONES AND ALIGNMENT: Grade 1 anterolisthesis of C4 on C5. Normal vertebral body heights. Marrow signal is unremarkable. No abnormal enhancement. SPINAL CORD: No substantial change in multiple short segment T2 hyperintensities in the cervical cord. No convincing new lesions or enhancing lesions. SOFT TISSUES: Approximately 2.1 cm left thyroid nodule. C2-C3: No significant disc herniation. No spinal canal stenosis or neural foraminal narrowing. C3-C4: Disc osteophyte complex with ligamentum flavum thickening. Resulting progressive moderate to severe canal stenosis. Left greater than right facet and uncovertebral hypertrophy. Resulting progressive moderate to severe left and moderate right foraminal stenosis. C4-C5: Small posterior disc osteophyte complex with bilateral facet and uncovertebral hypertrophy. Resulting moderate to  severe left and moderate right foraminal stenosis. Patent canal. C5-C6: Left greater than right facet and uncovertebral hypertrophy. Moderate left foraminal stenosis. Patent canal and right foramen. C6-C7: Left greater than right facet and uncovertebral hypertrophy. Mild left foraminal stenosis. Patent canal and right foramen. C7-T1: No significant disc herniation. No spinal canal stenosis or neural foraminal narrowing. IMPRESSION: 1. No substantial change in multiple short segment T2 hyperintensities in the cervical cord. No convincing new or enhancing lesions. 2. At C3-C4, progressive moderate to severe canal and left foraminal stenosis. Moderate right foraminal stenosis. 3. At C4-C5, moderate to severe left and moderate right foraminal stenosis. 4. At C5-C6, moderate left foraminal stenosis. 5. Approximately 2.1 cm left thyroid nodule. Recommend thyroid ultrasound. Electronically signed by: Gilmore Molt MD 10/29/2024 10:22 PM EST RP Workstation: [id number]   MR Lumbar Spine W  Wo Contrast Result Date: 10/29/2024 EXAM: MRI LUMBAR SPINE 10/29/2024 09:37:00 PM TECHNIQUE: Multiplanar multisequence MRI of the lumbar spine was performed without or with the administration of intravenous contrast. COMPARISON: None available. CLINICAL HISTORY: Multiple sclerosis (MS) FINDINGS: Motion limited study. BONES AND ALIGNMENT: Normal alignment. Normal vertebral body heights. Bone marrow signal is unremarkable. SPINAL CORD: The conus terminates normally. Normal appearance of the conus. No abnormal enhancement. SOFT TISSUES: No paraspinal mass. L1-L2: No significant disc herniation. No spinal canal stenosis or neural foraminal narrowing. L2-L3: Mild disc bulge with prominent dorsal epidural fat. Bilateral facet arthropathy. Resulting mild canal stenosis. Patent foramina. L3-L4: Small bilateral foraminal disc protrusions. Prominent dorsal epidural fat. Bilateral facet arthropathy. No significant canal or foraminal stenosis.  L4-L5: Mild disc bulging. Bilateral facet arthropathy. No significant canal or foraminal stenosis. L5-S1: Mild disc bulging. Bilateral facet arthropathy. No significant canal or foraminal stenosis. IMPRESSION: 1. Normal appearance of the conus. No abnormal enhancement. 2. Mild canal stenosis at L2-L3. Electronically signed by: Gilmore Molt MD 10/29/2024 10:07 PM EST RP Workstation: [id number]   MR THORACIC SPINE W WO CONTRAST Result Date: 10/29/2024 EXAM: MRI THORACIC SPINE WITH AND WITHOUT INTRAVENOUS CONTRAST 10/29/2024 09:37:00 PM TECHNIQUE: Multiplanar multisequence MRI of the thoracic spine was performed with and without the administration of intravenous contrast. COMPARISON: None available. CLINICAL HISTORY: Multiple sclerosis (MS) Multiple sclerosis (MS) FINDINGS: BONES AND ALIGNMENT: Exaggerated kyphosis. No substantial sagittal subluxation. Normal vertebral body heights. Bone marrow signal is unremarkable. No abnormal enhancement. SPINAL CORD: Short segment T2 hyperintense lesion at the cervicothoracic junction, compatible with prior demyelination. Suspect a few other more subtle lesions in the thoracic cord, including on the left at T4-T5 and centrally at T8-T9 .motion limits assessment of the cord. No abnormal enhancement. SOFT TISSUES: Approximately 2.1 cm left thyroid nodule. DEGENERATIVE CHANGES: No significant canal stenosis. At T9-T10 , moderate to severe right and mild left foraminal stenosis. At T10-T11, moderate right foraminal stenosis tune At T7-T8 and T8-T9, mild to moderate bilateral foraminal stenosis. IMPRESSION: 1. Multiple short-segment T2 hyperintensities in the cord compatible with chronic demyelination. No enhancement to suggest active demyelination. No priors of the thoracic spine. 2. At T9-T10, moderate to severe right foraminal stenosis. 3. At T10-T11, moderate right foraminal stenosis. 4. No significant canal stenosis. 5. Approximately 2.1 cm left thyroid nodule. Recommend  thyroid ultrasound. Electronically signed by: Gilmore Molt MD 10/29/2024 10:02 PM EST RP Workstation: [id number]   MR Brain W and Wo Contrast Result Date: 10/29/2024 EXAM: MRI BRAIN WITH AND WITHOUT CONTRAST 10/29/2024 09:36:00 PM TECHNIQUE: Multiplanar multisequence MRI of the head/brain was performed with and without the administration of intravenous contrast. COMPARISON: MRI head 11/04/2018 CLINICAL HISTORY: Multiple sclerosis (MS) FINDINGS: BRAIN AND VENTRICLES: No acute infarct. No acute intracranial hemorrhage. No mass effect or midline shift. No hydrocephalus. No substantial change in age-advanced T2 hyperintensities in the white matter, many of which are oriented perpendicular to the lateral ventricles. No new or enhancing lesions identified. Normal flow voids. No mass or abnormal enhancement. ORBITS: No acute abnormality. SINUSES: No acute abnormality. BONES AND SOFT TISSUES: No acute soft tissue abnormality. IMPRESSION: 1. Similar age-advanced white matter T2 hyperintensities, compatible with known multiple sclerosis. No new or enhancing lesions identified. 2. No acute intracranial abnormality. Electronically signed by: Gilmore Molt MD 10/29/2024 09:47 PM EST RP Workstation: [id number]   DG Knee Complete 4 Views Left Result Date: 10/28/2024 EXAM: 4 VIEW(S) XRAY OF THE LEFT KNEE 10/28/2024 07:20:00 PM COMPARISON: None available. CLINICAL HISTORY: fall injury FINDINGS: BONES AND  JOINTS: No acute fracture. No malalignment. No significant joint effusion. Mild degenerative changes of the medial compartment with osteophyte formation. SOFT TISSUES: The soft tissues are unremarkable. IMPRESSION: 1. No acute fracture or dislocation. Electronically signed by: Norman Gatlin MD 10/28/2024 07:49 PM EST RP Workstation: [id number]   DG Ankle Complete Left Result Date: 10/28/2024 EXAM: 3 OR MORE VIEW(S) XRAY OF THE LEFT ANKLE 10/28/2024 07:20:00 PM CLINICAL HISTORY: fall injury COMPARISON: None available.  FINDINGS: BONES AND JOINTS: No acute fracture. No dislocation. Cavovarus deformity of the left foot. Hammertoes. SOFT TISSUES: Subcutaneous soft tissue edema. IMPRESSION: 1. No acute fracture or dislocation. Electronically signed by: Norman Gatlin MD 10/28/2024 07:49 PM EST RP Workstation: [id number]   DG Hip Unilat With Pelvis 2-3 Views Left Result Date: 10/28/2024 EXAM: 2 OR MORE VIEW(S) XRAY OF THE HIP 10/28/2024 07:20:00 PM COMPARISON: None available. CLINICAL HISTORY: fall injury FINDINGS: BONES AND JOINTS: No acute fracture. No malalignment. SOFT TISSUES: Vascular calcification. IMPRESSION: 1. No acute fracture or dislocation. Electronically signed by: Norman Gatlin MD 10/28/2024 07:46 PM EST RP Workstation: [id number]   DG Chest 1 View Result Date: 10/28/2024 EXAM: 1 VIEW(S) XRAY OF THE CHEST 10/28/2024 07:20:00 PM COMPARISON: None available. CLINICAL HISTORY: fall injury FINDINGS: LUNGS AND PLEURA: Elevated right hemidiaphragm. Right basilar atelectasis. Otherwise no focal pulmonary opacity. No pleural effusion. No pneumothorax. HEART AND MEDIASTINUM: Aortic arch atherosclerosis. No acute abnormality of the cardiac silhouette. BONES AND SOFT TISSUES: No acute osseous abnormality. IMPRESSION: 1. No acute process. Electronically signed by: Norman Gatlin MD 10/28/2024 07:45 PM EST RP Workstation: [id number]   Scheduled Meds:  acidophilus  1 capsule Oral Daily   baclofen   10 mg Oral TID   buPROPion   150 mg Oral Daily   cholecalciferol   1,000 Units Oral BID   enoxaparin  (LOVENOX ) injection  40 mg Subcutaneous Q24H   FLUoxetine   40 mg Oral Daily   irbesartan   37.5 mg Oral Daily   levothyroxine   50 mcg Oral Q0600   multivitamin with minerals  1 tablet Oral Daily   oxybutynin   10 mg Oral QHS   rosuvastatin   20 mg Oral QHS   sodium chloride  flush  3 mL Intravenous Q12H   sodium chloride  flush  3 mL Intravenous Q12H   tiZANidine   4 mg Oral QHS   vitamin B-12  500 mcg Oral Daily   Continuous  Infusions:  sodium chloride      cefTRIAXone  (ROCEPHIN )  IV Stopped (10/30/24 0733)     LOS: 1 day    Time spent: 50 mins    Darcel Dawley, MD [facility]   If 7PM-7AM, please contact night-coverage

## 2024-10-30 NOTE — Care Management Obs Status (Signed)
 MEDICARE OBSERVATION STATUS NOTIFICATION   Patient Details  Name: Mary Villanueva MRN: [id number] Date of Birth: Jan 05, 1956   Medicare Observation Status Notification Given:  Yes    Tom-Johnson, Harvest Muskrat, RN 10/30/2024, 2:23 PM

## 2024-10-30 NOTE — Care Management CC44 (Signed)
 Condition Code 44 Documentation Completed  Patient Details  Name: Mary Villanueva MRN: [id number] Date of Birth: 1956-08-29   Condition Code 44 given:  Yes Patient signature on Condition Code 44 notice:  Yes Documentation of 2 MD's agreement:  Yes Code 44 added to claim:  Yes    Tom-Johnson, Harvest Muskrat, RN 10/30/2024, 2:23 PM

## 2024-10-30 NOTE — Evaluation (Signed)
 Physical Therapy Evaluation Patient Details Name: Mary Villanueva MRN: [id number] DOB: 12/23/1955 Today's Date: 10/30/2024  History of Present Illness  68 y.o. female admitted 10/29/24 with increased weakness and falls; of note, recent dx UTI. LLE imaging negative for acute injury. Workup for acute cystitis. Brain and lumbar MRI compatible with known MS, no new or enhancing lesions identified. PMH includes multiple sclerosis, spasticity, overactive bladder, CKD, essential HTN, anemia, depression.   Clinical Impression  Pt presents with an overall decrease in functional mobility secondary to above. PTA, pt mod indep with wheelchair use, stand pivot transfers; lives with brother who assists with iADLs as needed. Today, pt with significant BLE instability and weakness requiring modA and stedy to stand; limited by weakness, decreased activity tolerance, impaired balance strategies. Pt motivated to participate and regain mod indep PLOF. Pt would benefit from intensive, post-acute rehab services (>/3 hrs/day) to maximize functional mobility and independence prior to d/c home.       If plan is discharge home, recommend the following: A lot of help with walking and/or transfers;A lot of help with bathing/dressing/bathroom;Assistance with cooking/housework;Help with stairs or ramp for entrance;Assist for transportation   Can travel by private vehicle   No    Equipment Recommendations  (TBD next venue)  Recommendations for Other Services       Functional Status Assessment Patient has had a recent decline in their functional status and demonstrates the ability to make significant improvements in function in a reasonable and predictable amount of time.     Precautions / Restrictions Precautions Precautions: Fall Recall of Precautions/Restrictions: Intact Restrictions Weight Bearing Restrictions Per Provider Order: No      Mobility  Bed Mobility Overal bed mobility: Needs Assistance Bed  Mobility: Supine to Sit, Sit to Supine     Supine to sit: Min assist, HOB elevated, Used rails Sit to supine: Min assist, Used rails   General bed mobility comments: minA for LE management supine<>sit, pt assisting well with BUE support, increased time to scoot hips to EOB with 1x posterior LOB requiring minA to correct    Transfers Overall transfer level: Needs assistance Equipment used: 1 person hand held assist, Ambulation equipment used Transfers: Sit to/from Stand Sit to Stand: Max assist, Mod assist           General transfer comment: initial sit>stand from EOB with HHA, pt able to offload buttocks but notable BLE instability with maxA. additional trial with stedy frame, modA for trunk elevation, heavy reliance on BUE to pull to standing; additional 2x sit<>stands from stedy seat with modA, poor eccentric lowering to sit EOB with knees buckling blocked by frame Transfer via Lift Equipment: Stedy  Ambulation/Gait                  Stairs            Wheelchair Mobility     Tilt Bed    Modified Rankin (Stroke Patients Only)       Balance Overall balance assessment: Needs assistance Sitting-balance support: No upper extremity supported, Feet unsupported Sitting balance-Leahy Scale: Fair     Standing balance support: Bilateral upper extremity supported, During functional activity Standing balance-Leahy Scale: Poor Standing balance comment: reliant on BUE support, external assist and knees blocked                             Pertinent Vitals/Pain Pain Assessment Pain Assessment: No/denies pain    Home Living  Family/patient expects to be discharged to:: Private residence Living Arrangements: Other relatives (brother) Available Help at Discharge: Family;Friend(s);Available PRN/intermittently Type of Home: House Home Access: Level entry       Home Layout: One level Home Equipment: Agricultural Consultant (2 wheels);Wheelchair - manual;Shower  seat;Electric scooter;Grab bars - tub/shower;Grab bars - toilet;Toilet riser Additional Comments: brother moved in with her a couple years ago to provide support    Prior Function Prior Level of Function : Independent/Modified Independent;Needs assist             Mobility Comments: retired [employer]. typically mod indep w/c-level; sleeps in lift recliner; mod indep stand pivot transfers by holding onto objects/bars/counter/etc.; reports she has not been able to stand by herself the past week due to worsening BLE weakness ADLs Comments: typically mod indep ADLs (some assist for set-up); does bird baths. brother assists with cooking, household tasks     Extremity/Trunk Assessment   Upper Extremity Assessment Upper Extremity Assessment: Generalized weakness    Lower Extremity Assessment Lower Extremity Assessment: RLE deficits/detail;LLE deficits/detail RLE Deficits / Details: functional observed strength </ 3/5 LLE Deficits / Details: functional observed strength </ 3/5; chronic L tibial IR/ankle inv. notable L lower leg edema; pt reports chronic LLE swelling; reports she never had LLE pain (she is unsure why she had workup for potential LLE fx)       Communication   Communication Communication: No apparent difficulties    Cognition Arousal: Alert Behavior During Therapy: WFL for tasks assessed/performed   PT - Cognitive impairments: No apparent impairments                         Following commands: Intact       Cueing Cueing Techniques: Verbal cues     General Comments General comments (skin integrity, edema, etc.): HR 80s, BP 134/65, Spo2 98% on RA. educ re: role of acute PT, POC, activity recommendations, importance of mobility, OOB transfer options with nursing staff, potential d/c needs. pt reports she has been to [facility] in the past for rehab    Exercises     Assessment/Plan    PT Assessment Patient needs continued PT services  PT Problem List  Decreased strength;Decreased activity tolerance;Decreased balance;Decreased mobility;Decreased knowledge of use of DME       PT Treatment Interventions DME instruction;Functional mobility training;Therapeutic activities;Therapeutic exercise;Balance training;Neuromuscular re-education;Patient/family education;Wheelchair mobility training    PT Goals (Current goals can be found in the Care Plan section)  Acute Rehab PT Goals Patient Stated Goal: regain strength so she can return home PT Goal Formulation: With patient Time For Goal Achievement: 11/13/24 Potential to Achieve Goals: Good    Frequency Min 2X/week     Co-evaluation               AM-PAC PT 6 Clicks Mobility  Outcome Measure Help needed turning from your back to your side while in a flat bed without using bedrails?: A Little Help needed moving from lying on your back to sitting on the side of a flat bed without using bedrails?: A Lot Help needed moving to and from a bed to a chair (including a wheelchair)?: A Lot Help needed standing up from a chair using your arms (e.g., wheelchair or bedside chair)?: Total Help needed to walk in hospital room?: Total Help needed climbing 3-5 steps with a railing? : Total 6 Click Score: 10    End of Session Equipment Utilized During Treatment: Gait belt Activity Tolerance: Patient  tolerated treatment well Patient left: in bed;with call bell/phone within reach;with bed alarm set;with nursing/sitter in room Nurse Communication: Mobility status;Need for lift equipment PT Visit Diagnosis: Other abnormalities of gait and mobility (R26.89);Muscle weakness (generalized) (M62.81)    Time: 1330-1409 PT Time Calculation (min) (ACUTE ONLY): 39 min   Charges:   PT Evaluation $PT Eval Moderate Complexity: 1 Mod PT Treatments $Therapeutic Activity: 23-37 mins PT General Charges $$ ACUTE PT VISIT: 1 Visit       Darice Almas, PT, DPT Acute Rehabilitation Services  Personal:  [facility] Office: [phone]  Darice LITTIE Almas 10/30/2024, 4:05 PM

## 2024-10-31 DIAGNOSIS — N3000 Acute cystitis without hematuria: Secondary | ICD-10-CM | POA: Diagnosis not present

## 2024-10-31 LAB — CBC
HCT: 36.2 % (ref 36.0–46.0)
Hemoglobin: 12.3 g/dL (ref 12.0–15.0)
MCH: 31.3 pg (ref 26.0–34.0)
MCHC: 34 g/dL (ref 30.0–36.0)
MCV: 92.1 fL (ref 80.0–100.0)
Platelets: 210 K/uL (ref 150–400)
RBC: 3.93 MIL/uL (ref 3.87–5.11)
RDW: 12.4 % (ref 11.5–15.5)
WBC: 5.5 K/uL (ref 4.0–10.5)
nRBC: 0 % (ref 0.0–0.2)

## 2024-10-31 LAB — BASIC METABOLIC PANEL WITH GFR
Anion gap: 7 (ref 5–15)
BUN: 15 mg/dL (ref 8–23)
CO2: 26 mmol/L (ref 22–32)
Calcium: 8.5 mg/dL — ABNORMAL LOW (ref 8.9–10.3)
Chloride: 106 mmol/L (ref 98–111)
Creatinine, Ser: 1.1 mg/dL — ABNORMAL HIGH (ref 0.44–1.00)
GFR, Estimated: 54 mL/min — ABNORMAL LOW (ref >60)
Glucose, Bld: 91 mg/dL (ref 70–99)
Potassium: 4.2 mmol/L (ref 3.5–5.1)
Sodium: 139 mmol/L (ref 135–145)

## 2024-10-31 LAB — PHOSPHORUS: Phosphorus: 3.9 mg/dL (ref 2.5–4.6)

## 2024-10-31 LAB — MAGNESIUM: Magnesium: 2.1 mg/dL (ref 1.7–2.4)

## 2024-10-31 MED ORDER — FLUOXETINE HCL 20 MG PO CAPS
60.0000 mg | ORAL_CAPSULE | Freq: Every day | ORAL | Status: DC
  Administered 2024-11-01 – 2024-11-07 (×7): 60 mg via ORAL
  Filled 2024-10-31 (×8): qty 3

## 2024-10-31 MED ORDER — FLUOXETINE HCL 20 MG PO CAPS
20.0000 mg | ORAL_CAPSULE | Freq: Once | ORAL | Status: AC
  Administered 2024-10-31: 20 mg via ORAL
  Filled 2024-10-31: qty 1

## 2024-10-31 MED ORDER — LACTATED RINGERS IV SOLN: INTRAVENOUS | Status: AC

## 2024-10-31 NOTE — Evaluation (Signed)
 Occupational Therapy Evaluation Patient Details Name: Mary Villanueva MRN: [id number] DOB: 13-Jul-1956 Today's Date: 10/31/2024   History of Present Illness   68 y.o. female admitted 10/29/24 with increased weakness and falls; of note, recent dx UTI. LLE imaging negative for acute injury. Workup for acute cystitis. Brain and lumbar MRI compatible with known MS, no new or enhancing lesions identified. PMH includes multiple sclerosis, spasticity, overactive bladder, CKD, essential HTN, anemia, depression.     Clinical Impressions PTA Pt was Mod I at W/c level for functional mobility and Mod I for ADLs, requires light assistance with IADLs from brother. Pt currently requires up to Mod A +2 and use of stedy for functional transfers, as well us  up to Max A for ADL engagement. Pt is primarily limited by generalized weakness, decreased activity tolerance, and unsteadiness on feet. OT to continue to follow Pt acutely to facilitate progress towards goals. CIR screened Pt and deemed she did not have medical necessity to justify in hospital rehab. Current recommendation based on current level of function is continued inpatient follow up therapy, <3 hours/day to maximize occupational independence and decrease burden of care for safe return home.       If plan is discharge home, recommend the following:   Two people to help with walking and/or transfers;A lot of help with bathing/dressing/bathroom;Assistance with cooking/housework;Assist for transportation;Help with stairs or ramp for entrance     Functional Status Assessment   Patient has had a recent decline in their functional status and demonstrates the ability to make significant improvements in function in a reasonable and predictable amount of time.     Equipment Recommendations   Other (comment) (defer to next venue)     Recommendations for Other Services         Precautions/Restrictions   Precautions Precautions:  Fall Recall of Precautions/Restrictions: Intact Restrictions Weight Bearing Restrictions Per Provider Order: No     Mobility Bed Mobility Overal bed mobility: Needs Assistance Bed Mobility: Supine to Sit     Supine to sit: Min assist, HOB elevated     General bed mobility comments: Min HHA to facilitate trunk elevation. Pt was able to manage BLE off of bed.    Transfers Overall transfer level: Needs assistance Equipment used: Ambulation equipment used Transfers: Sit to/from Stand, Bed to chair/wheelchair/BSC Sit to Stand: Mod assist, +2 physical assistance, +2 safety/equipment, Via lift equipment           General transfer comment: Mod A to rise from bed with use of stedy. Pt initially good standing endurance with BUE use. Able to maintain standing balance with BUE and Min exernal support for ~45 seconds. On second stand attempt, Pt with decreased endurance resulting in uncontrolled sit onto recliner. Transfer via Lift Equipment: Stedy    Balance Overall balance assessment: Needs assistance Sitting-balance support: No upper extremity supported, Feet supported Sitting balance-Leahy Scale: Fair     Standing balance support: Bilateral upper extremity supported, During functional activity, Reliant on assistive device for balance Standing balance-Leahy Scale: Poor Standing balance comment: Dependent on BUE support and use of transfer equipment.                           ADL either performed or assessed with clinical judgement   ADL Overall ADL's : Needs assistance/impaired Eating/Feeding: Independent;Sitting   Grooming: Set up;Sitting   Upper Body Bathing: Minimal assistance;Sitting   Lower Body Bathing: Maximal assistance;Sitting/lateral leans   Upper Body Dressing :  Set up;Sitting   Lower Body Dressing: Maximal assistance;Sitting/lateral leans   Toilet Transfer: Moderate assistance;+2 for physical assistance;+2 for safety/equipment;BSC/3in1 Toilet  Transfer Details (indicate cue type and reason): use of stedy Toileting- Clothing Manipulation and Hygiene: Total assistance Toileting - Clothing Manipulation Details (indicate cue type and reason): purewick             Vision Patient Visual Report: No change from baseline       Perception         Praxis         Pertinent Vitals/Pain Pain Assessment Pain Assessment: No/denies pain     Extremity/Trunk Assessment Upper Extremity Assessment Upper Extremity Assessment: Generalized weakness   Lower Extremity Assessment Lower Extremity Assessment: Defer to PT evaluation   Cervical / Trunk Assessment Cervical / Trunk Assessment: Normal   Communication Communication Communication: No apparent difficulties   Cognition Arousal: Alert Behavior During Therapy: WFL for tasks assessed/performed Cognition: No apparent impairments                               Following commands: Intact       Cueing  General Comments   Cueing Techniques: Verbal cues  Pt tolerate session well.   Exercises     Shoulder Instructions      Home Living Family/patient expects to be discharged to:: Private residence Living Arrangements: Other relatives (brother) Available Help at Discharge: Family;Friend(s);Available PRN/intermittently Type of Home: House Home Access: Level entry     Home Layout: One level     Bathroom Shower/Tub: Producer, Television/film/video: Handicapped height     Home Equipment: Agricultural Consultant (2 wheels);Wheelchair - manual;Shower seat;Grab bars - tub/shower;Grab bars - toilet;Toilet riser   Additional Comments: brother moved in with her a couple years ago to provide support      Prior Functioning/Environment Prior Level of Function : Independent/Modified Independent;Needs assist             Mobility Comments: retired [employer]. typically mod indep w/c-level; sleeps in lift recliner; mod indep stand pivot transfers by holding onto  objects/bars/counter/etc.; reports she has not been able to stand by herself the past week due to worsening BLE weakness ADLs Comments: typically mod indep ADLs (some assist for set-up); does bird baths. brother assists with cooking, household tasks    OT Problem List: Decreased strength;Decreased activity tolerance;Impaired balance (sitting and/or standing);Decreased knowledge of use of DME or AE   OT Treatment/Interventions: Self-care/ADL training;Therapeutic exercise;Energy conservation;DME and/or AE instruction;Therapeutic activities;Patient/family education;Balance training      OT Goals(Current goals can be found in the care plan section)   Acute Rehab OT Goals Patient Stated Goal: To get better OT Goal Formulation: With patient Time For Goal Achievement: 11/14/24 Potential to Achieve Goals: Good ADL Goals Pt Will Perform Upper Body Dressing: with modified independence;sitting Pt Will Perform Lower Body Dressing: with min assist;with adaptive equipment;sitting/lateral leans Pt Will Transfer to Toilet: stand pivot transfer;with min assist;bedside commode Pt Will Perform Toileting - Clothing Manipulation and hygiene: with contact guard assist;sitting/lateral leans Pt/caregiver will Perform Home Exercise Program: Both right and left upper extremity;Increased strength;With written HEP provided   OT Frequency:  Min 2X/week    Co-evaluation              AM-PAC OT 6 Clicks Daily Activity     Outcome Measure Help from another person eating meals?: None Help from another person taking care of personal grooming?: A  Little Help from another person toileting, which includes using toliet, bedpan, or urinal?: A Lot Help from another person bathing (including washing, rinsing, drying)?: A Lot Help from another person to put on and taking off regular upper body clothing?: A Little Help from another person to put on and taking off regular lower body clothing?: A Lot 6 Click Score:  16   End of Session Equipment Utilized During Treatment: Gait belt Nurse Communication: Mobility status  Activity Tolerance: Patient tolerated treatment well Patient left: in chair;with call bell/phone within reach;with chair alarm set  OT Visit Diagnosis: Muscle weakness (generalized) (M62.81);Unsteadiness on feet (R26.81);Other symptoms and signs involving the nervous system (R29.898)                Time: 8785-8759 OT Time Calculation (min): 26 min Charges:  OT General Charges $OT Visit: 1 Visit OT Evaluation $OT Eval Low Complexity: 1 Low OT Treatments $Therapeutic Activity: 8-22 mins  Maurilio CROME, OTR/L.  [facility] Acute Rehabilitation  Office: [phone]   Maurilio PARAS Forney Kleinpeter 10/31/2024, 3:49 PM

## 2024-10-31 NOTE — Progress Notes (Signed)
 PROGRESS NOTE    Mary Villanueva  FMW:[id number] DOB: 1956/10/30 DOA: 10/29/2024 PCP: Larnell Hamilton, MD    Brief Narrative:   This 68 y.o. female with PMH significant for Multiple sclerosis,  wheelchair-bound, history of MS for 40 years initially stable however progressively getting worse over last 10 years, chronic weakness of the bilateral lower extremities, gait abnormality, impaired mobility, spasticity, depression, anemia, anxiety, overactive bladder, CKD stage IIIa, hypothyroidism, and essential hypertension presented to the ED with progressively worsening generalized weakness for 3 days.  Patient had a fall from the wheelchair 3 days ago and had UTI 1 week ago.  She has been taking antibiotics prescribed by PCP without any improvement.  Patient reports since the fall her mobility is more restricted and She is unable to bear weight specifically on the left lower extremity.  She has developed left ankle swelling and pain.  She was hemodynamically stable in the ED.  UA consistent with UTI.  Patient had MRI, C-spine T-spine and lumbar spine.  MRI > No new or enhancing lesions.  Similar age advanced white matter T2 hyperintensities compatible with known multiple sclerosis.  Patient was admitted for further evaluation and was continued on IV antibiotics.   Assessment & Plan:   Acute cystitis: Patient presented to the ED with recurrent falls, worsening bilateral lower extremity weakness. She was diagnosed with UTI and started on oral antibiotics last week. UA consistent with UTI. Continue IV ceftriaxone . Follow-up blood and urine cultures.  Change antibiotics according Cultures results.  History multiple sclerosis: History of chronic lower extremity weakness,  wheelchair-bound at baseline. Recent history of recurrent falls due to bilateral lower extremity weakness. History of gait abnormality. Wheelchair dependence Chronic muscle spasticity. She presented with worsening bilateral  extremities weakness, frequent falls while transferring  from wheelchair to bed.   Patient denies any blurry vision, headache, ataxia, limb paresthesia, chest pain, abdominal pain, shortness of breath, ophthalmoplegia, vertigo, hearing loss, sensory change hyperreflexia. In the ED extensive imaging has been completed. Chest x-ray >  No active disease process. X-ray hip  > No evidence of fracture or dislocation. X-ray of the left knee >  No evidence of fracture or dislocation. MRI brain > no acute intra cranial abnormality.  No new or enhancing lesion. MRI C-spine ,  thoracic spine and lumbar spine >  No evidence of abnormal enhancement. Continue baclofen  10 mg 3 times daily and tizanidine  at bedtime. At this time there is no concern for MS flare. Obtain PT and OT for evaluation of weakness and balance.  History of hypothyroidism: Continue levothyroxine .  History of depression: Continue Prozac .   History of overactive bladder: Continue In-N-Out cath as needed and oxybutynin .   History of CKD stage IIIa: Stable renal function.  Avoid nephrotoxic medications.   History of essential hypertension Continue irbesartan .  Left ankle swelling: Follow-up venous duplex left lower extremity.  DVT prophylaxis: Lovenox  Code Status: Full code Family Communication: No family at bedside Disposition Plan:    Status is: Inpatient Remains inpatient appropriate because: Admitted for acute cystitis, worsening bilateral lower extremity weakness in the setting of MS. Imaging consistent with no new MS flare.  Consultants:  None  Procedures:  MRI  Antimicrobials:  Anti-infectives (From admission, onward)    Start     Dose/Rate Route Frequency Ordered Stop   10/30/24 0600  cefTRIAXone  (ROCEPHIN ) 1 g in sodium chloride  0.9 % 100 mL IVPB        1 g 200 mL/hr over 30 Minutes Intravenous Every  24 hours 10/29/24 2056 11/05/24 0559   10/29/24 1730  cefTRIAXone  (ROCEPHIN ) 2 g in sodium chloride  0.9 %  100 mL IVPB        2 g 200 mL/hr over 30 Minutes Intravenous  Once 10/29/24 1728 10/29/24 1837      Subjective: Patient was seen and examined at bedside. Overnight events noted. Patient reports still having urinary symptoms.  She reports weakness is at her baseline.  Objective: Vitals:   10/30/24 2009 10/31/24 0006 10/31/24 0519 10/31/24 0849  BP: (!) 130/30 (!) 119/55 138/70 (!) 127/52  Pulse:  85 83 80  Resp:  19 20 15   Temp: 98.3 F (36.8 C) 98.4 F (36.9 C) 98 F (36.7 C) 98.1 F (36.7 C)  TempSrc: Oral Oral Oral Oral  SpO2:  94% 94% 95%  Weight:      Height:        Intake/Output Summary (Last 24 hours) at 10/31/2024 0944 Last data filed at 10/31/2024 0532 Gross per 24 hour  Intake --  Output 900 ml  Net -900 ml   Filed Weights   10/29/24 1728  Weight: 65.8 kg    Examination:  Awake Alert, No new F.N deficits, Normal affect Joy.AT,PERRAL Supple Neck, No JVD,   Symmetrical Chest wall movement, Good air movement bilaterally, CTAB RRR,No Gallops, Rubs or new Murmurs,  +ve B.Sounds, Abd Soft, No tenderness,   No Cyanosis, Clubbing or edema      Data Review:   Patient Lines/Drains/Airways Status     Active Line/Drains/Airways     Name Placement date Placement time Site Days   Peripheral IV 10/29/24 Anterior;Right Forearm 10/29/24  1807  Forearm  2   External Urinary Catheter 10/30/24  1230  --  1             Inpatient Medications  Scheduled Meds:  baclofen   10 mg Oral TID   enoxaparin  (LOVENOX ) injection  40 mg Subcutaneous Q24H   FLUoxetine   20 mg Oral Once   [START ON 11/01/2024] FLUoxetine   60 mg Oral Daily   irbesartan   37.5 mg Oral Daily   levothyroxine   50 mcg Oral Q0600   oxybutynin   10 mg Oral QHS   rosuvastatin   20 mg Oral QHS   sodium chloride  flush  3 mL Intravenous Q12H   sodium chloride  flush  3 mL Intravenous Q12H   tiZANidine   4 mg Oral QHS   Continuous Infusions:  cefTRIAXone  (ROCEPHIN )  IV 1 g (10/31/24 0532)   PRN  Meds:.acetaminophen  **OR** acetaminophen , ondansetron  **OR** ondansetron  (ZOFRAN ) IV, sodium chloride  flush  DVT Prophylaxis  enoxaparin  (LOVENOX ) injection 40 mg Start: 10/29/24 2200 SCDs Start: 10/29/24 2057 Place TED hose Start: 10/29/24 2057    Recent Labs  Lab 10/28/24 1830 10/29/24 1807 10/30/24 0500 10/31/24 0701  WBC 5.5 6.3 5.1 5.5  HGB 12.1 12.0 12.0 12.3  HCT 36.4 36.3 37.5 36.2  PLT 220 225 200 210  MCV 93.6 94.5 98.4 92.1  MCH 31.1 31.3 31.5 31.3  MCHC 33.2 33.1 32.0 34.0  RDW 12.5 12.6 12.7 12.4  LYMPHSABS 0.8 0.8  --   --   MONOABS 0.6 0.7  --   --   EOSABS 0.0 0.1  --   --   BASOSABS 0.0 0.0  --   --     Recent Labs  Lab 10/28/24 1830 10/29/24 1804 10/29/24 1953 10/30/24 0500 10/31/24 0701  NA 137  --  140 137 139  K 4.6  --  4.4 4.1 4.2  CL 105  --  106 107 106  CO2 25  --  21* 20* 26  ANIONGAP 7  --  13 10 7   GLUCOSE 91  --  86 80 91  BUN 19  --  22 17 15   CREATININE 1.18*  --  1.23* 1.03* 1.10*  AST  --   --  30 31  --   ALT  --   --  41 35  --   ALKPHOS  --   --  58 52  --   BILITOT  --   --  0.3 0.5  --   ALBUMIN  --   --  3.1* 3.0*  --   LATICACIDVEN  --  0.8  --   --   --   INR 1.0  --   --   --   --   MG  --   --   --   --  2.1  PHOS  --   --   --   --  3.9  CALCIUM  8.1*  --  8.5* 7.7* 8.5*      Recent Labs  Lab 10/28/24 1830 10/29/24 1804 10/29/24 1953 10/30/24 0500 10/31/24 0701  LATICACIDVEN  --  0.8  --   --   --   INR 1.0  --   --   --   --   MG  --   --   --   --  2.1  CALCIUM  8.1*  --  8.5* 7.7* 8.5*    --------------------------------------------------------------------------------------------------------------- No results found for: CHOL, HDL, LDLCALC, LDLDIRECT, TRIG, CHOLHDL  Lab Results  Component Value Date   HGBA1C 5.6 08/02/2020   No results for input(s): TSH, T4TOTAL, FREET4, T3FREE, THYROIDAB in the last 72 hours. No results for input(s): VITAMINB12, FOLATE, FERRITIN,  TIBC, IRON, RETICCTPCT in the last 72 hours. ------------------------------------------------------------------------------------------------------------------ Cardiac Enzymes No results for input(s): CKMB, TROPONINI, MYOGLOBIN in the last 168 hours.  Invalid input(s): CK  Micro Results Recent Results (from the past 240 hours)  Urine Culture     Status: Abnormal   Collection Time: 10/28/24  6:30 PM   Specimen: Urine, Clean Catch  Result Value Ref Range Status   Specimen Description URINE, CLEAN CATCH  Final   Special Requests   Final    NONE Performed at [facility], [address], [place], [place] [address]    Culture MULTIPLE SPECIES PRESENT, SUGGEST RECOLLECTION (A)  Final   Report Status 10/30/2024 FINAL  Final  Blood culture (routine x 2)     Status: None (Preliminary result)   Collection Time: 10/29/24  5:14 PM   Specimen: BLOOD  Result Value Ref Range Status   Specimen Description BLOOD SITE NOT SPECIFIED  Final   Special Requests   Final    BOTTLES DRAWN AEROBIC AND ANAEROBIC Blood Culture results may not be optimal due to an inadequate volume of blood received in culture bottles   Culture   Final    NO GROWTH < 24 HOURS Performed at [facility], [address] [place], [place] [address]    Report Status PENDING  Incomplete  Blood culture (routine x 2)     Status: None (Preliminary result)   Collection Time: 10/29/24  5:14 PM   Specimen: BLOOD  Result Value Ref Range Status   Specimen Description BLOOD SITE NOT SPECIFIED  Final   Special Requests   Final    BOTTLES DRAWN AEROBIC AND ANAEROBIC Blood Culture results may  not be optimal due to an inadequate volume of blood received in culture bottles   Culture   Final    NO GROWTH < 24 HOURS Performed at [facility], [address] [place], [place] [address]    Report Status PENDING  Incomplete    Radiology Reports  VAS US  LOWER EXTREMITY VENOUS (DVT) (ONLY [facility] & [facility]) Result  Date: 10/30/2024  Lower Venous DVT Study Patient Name:  Ammy M Clewis  Date of Exam:   10/30/2024 Medical Rec #: [id number]            Accession #:    [id number] Date of Birth: 1956-01-01            Patient Gender: F Patient Age:   29 years Exam Location:  [facility] Procedure:      VAS US  LOWER EXTREMITY VENOUS (DVT) Referring Phys: MICAELA SUNDIL --------------------------------------------------------------------------------  Indications: Pain, and weakness, recent fall.  Limitations: Unable to properly position. Comparison Study: Previous study of the bilateral lower extremities on                   1.14.2025. Performing Technologist: Edilia Elden Appl  Examination Guidelines: A complete evaluation includes B-mode imaging, spectral Doppler, color Doppler, and power Doppler as needed of all accessible portions of each vessel. Bilateral testing is considered an integral part of a complete examination. Limited examinations for reoccurring indications may be performed as noted. The reflux portion of the exam is performed with the patient in reverse Trendelenburg.  +-----+---------------+---------+-----------+----------+--------------+ RIGHTCompressibilityPhasicitySpontaneityPropertiesThrombus Aging +-----+---------------+---------+-----------+----------+--------------+ CFV  Full           Yes      Yes                                 +-----+---------------+---------+-----------+----------+--------------+ SFJ  Full           Yes      Yes                                 +-----+---------------+---------+-----------+----------+--------------+   +---------+---------------+---------+-----------+----------+-------------------+ LEFT     CompressibilityPhasicitySpontaneityPropertiesThrombus Aging      +---------+---------------+---------+-----------+----------+-------------------+ CFV      Full           Yes      Yes                                       +---------+---------------+---------+-----------+----------+-------------------+ SFJ      Full           Yes      Yes                                      +---------+---------------+---------+-----------+----------+-------------------+ FV Prox  Full                                                             +---------+---------------+---------+-----------+----------+-------------------+ FV Mid   Full                                                             +---------+---------------+---------+-----------+----------+-------------------+  FV DistalFull                                                             +---------+---------------+---------+-----------+----------+-------------------+ PFV      Full                                                             +---------+---------------+---------+-----------+----------+-------------------+ POP      Full           Yes      Yes                                      +---------+---------------+---------+-----------+----------+-------------------+ PTV      Full                                                             +---------+---------------+---------+-----------+----------+-------------------+ PERO                                                  Not well visualized +---------+---------------+---------+-----------+----------+-------------------+    Summary: RIGHT: - No evidence of common femoral vein obstruction.   LEFT: - There is no evidence of deep vein thrombosis in the lower extremity. However, portions of this examination were limited- see technologist comments above.  - No cystic structure found in the popliteal fossa.  *See table(s) above for measurements and observations. Electronically signed by Debby Robertson on 10/30/2024 at 4:36:32 PM.    Final    MR Cervical Spine W and Wo Contrast Result Date: 10/29/2024 EXAM: MRI CERVICAL SPINE WITH AND WITHOUT CONTRAST 10/29/2024 09:36:00 PM TECHNIQUE:  Multiplanar multisequence MRI of the cervical spine was performed without and with the administration of intravenous contrast. COMPARISON: MRI cervical spine 05/16/2014 CLINICAL HISTORY: Multiple sclerosis (MS) FINDINGS: Motion limited study. Within this limitation: BONES AND ALIGNMENT: Grade 1 anterolisthesis of C4 on C5. Normal vertebral body heights. Marrow signal is unremarkable. No abnormal enhancement. SPINAL CORD: No substantial change in multiple short segment T2 hyperintensities in the cervical cord. No convincing new lesions or enhancing lesions. SOFT TISSUES: Approximately 2.1 cm left thyroid nodule. C2-C3: No significant disc herniation. No spinal canal stenosis or neural foraminal narrowing. C3-C4: Disc osteophyte complex with ligamentum flavum thickening. Resulting progressive moderate to severe canal stenosis. Left greater than right facet and uncovertebral hypertrophy. Resulting progressive moderate to severe left and moderate right foraminal stenosis. C4-C5: Small posterior disc osteophyte complex with bilateral facet and uncovertebral hypertrophy. Resulting moderate to severe left and moderate right foraminal stenosis. Patent canal. C5-C6: Left greater than right facet and uncovertebral hypertrophy. Moderate left foraminal stenosis. Patent canal and right foramen. C6-C7: Left greater than right facet and uncovertebral hypertrophy. Mild left foraminal stenosis.  Patent canal and right foramen. C7-T1: No significant disc herniation. No spinal canal stenosis or neural foraminal narrowing. IMPRESSION: 1. No substantial change in multiple short segment T2 hyperintensities in the cervical cord. No convincing new or enhancing lesions. 2. At C3-C4, progressive moderate to severe canal and left foraminal stenosis. Moderate right foraminal stenosis. 3. At C4-C5, moderate to severe left and moderate right foraminal stenosis. 4. At C5-C6, moderate left foraminal stenosis. 5. Approximately 2.1 cm left thyroid  nodule. Recommend thyroid ultrasound. Electronically signed by: Gilmore Molt MD 10/29/2024 10:22 PM EST RP Workstation: [id number]   MR Lumbar Spine W Wo Contrast Result Date: 10/29/2024 EXAM: MRI LUMBAR SPINE 10/29/2024 09:37:00 PM TECHNIQUE: Multiplanar multisequence MRI of the lumbar spine was performed without or with the administration of intravenous contrast. COMPARISON: None available. CLINICAL HISTORY: Multiple sclerosis (MS) FINDINGS: Motion limited study. BONES AND ALIGNMENT: Normal alignment. Normal vertebral body heights. Bone marrow signal is unremarkable. SPINAL CORD: The conus terminates normally. Normal appearance of the conus. No abnormal enhancement. SOFT TISSUES: No paraspinal mass. L1-L2: No significant disc herniation. No spinal canal stenosis or neural foraminal narrowing. L2-L3: Mild disc bulge with prominent dorsal epidural fat. Bilateral facet arthropathy. Resulting mild canal stenosis. Patent foramina. L3-L4: Small bilateral foraminal disc protrusions. Prominent dorsal epidural fat. Bilateral facet arthropathy. No significant canal or foraminal stenosis. L4-L5: Mild disc bulging. Bilateral facet arthropathy. No significant canal or foraminal stenosis. L5-S1: Mild disc bulging. Bilateral facet arthropathy. No significant canal or foraminal stenosis. IMPRESSION: 1. Normal appearance of the conus. No abnormal enhancement. 2. Mild canal stenosis at L2-L3. Electronically signed by: Gilmore Molt MD 10/29/2024 10:07 PM EST RP Workstation: [id number]   MR THORACIC SPINE W WO CONTRAST Result Date: 10/29/2024 EXAM: MRI THORACIC SPINE WITH AND WITHOUT INTRAVENOUS CONTRAST 10/29/2024 09:37:00 PM TECHNIQUE: Multiplanar multisequence MRI of the thoracic spine was performed with and without the administration of intravenous contrast. COMPARISON: None available. CLINICAL HISTORY: Multiple sclerosis (MS) Multiple sclerosis (MS) FINDINGS: BONES AND ALIGNMENT: Exaggerated kyphosis. No substantial  sagittal subluxation. Normal vertebral body heights. Bone marrow signal is unremarkable. No abnormal enhancement. SPINAL CORD: Short segment T2 hyperintense lesion at the cervicothoracic junction, compatible with prior demyelination. Suspect a few other more subtle lesions in the thoracic cord, including on the left at T4-T5 and centrally at T8-T9 .motion limits assessment of the cord. No abnormal enhancement. SOFT TISSUES: Approximately 2.1 cm left thyroid nodule. DEGENERATIVE CHANGES: No significant canal stenosis. At T9-T10 , moderate to severe right and mild left foraminal stenosis. At T10-T11, moderate right foraminal stenosis tune At T7-T8 and T8-T9, mild to moderate bilateral foraminal stenosis. IMPRESSION: 1. Multiple short-segment T2 hyperintensities in the cord compatible with chronic demyelination. No enhancement to suggest active demyelination. No priors of the thoracic spine. 2. At T9-T10, moderate to severe right foraminal stenosis. 3. At T10-T11, moderate right foraminal stenosis. 4. No significant canal stenosis. 5. Approximately 2.1 cm left thyroid nodule. Recommend thyroid ultrasound. Electronically signed by: Gilmore Molt MD 10/29/2024 10:02 PM EST RP Workstation: [id number]   MR Brain W and Wo Contrast Result Date: 10/29/2024 EXAM: MRI BRAIN WITH AND WITHOUT CONTRAST 10/29/2024 09:36:00 PM TECHNIQUE: Multiplanar multisequence MRI of the head/brain was performed with and without the administration of intravenous contrast. COMPARISON: MRI head 11/04/2018 CLINICAL HISTORY: Multiple sclerosis (MS) FINDINGS: BRAIN AND VENTRICLES: No acute infarct. No acute intracranial hemorrhage. No mass effect or midline shift. No hydrocephalus. No substantial change in age-advanced T2 hyperintensities in the white matter, many of which are oriented perpendicular to  the lateral ventricles. No new or enhancing lesions identified. Normal flow voids. No mass or abnormal enhancement. ORBITS: No acute abnormality.  SINUSES: No acute abnormality. BONES AND SOFT TISSUES: No acute soft tissue abnormality. IMPRESSION: 1. Similar age-advanced white matter T2 hyperintensities, compatible with known multiple sclerosis. No new or enhancing lesions identified. 2. No acute intracranial abnormality. Electronically signed by: Gilmore Molt MD 10/29/2024 09:47 PM EST RP Workstation: [id number]      Signature  -   Lavada Stank M.D on 10/31/2024 at 9:44 AM   -  To page go to [email or web address]

## 2024-10-31 NOTE — Progress Notes (Signed)
 Inpatient Rehab Admissions Coordinator:   Per therapy recommendations, patient was screened for CIR candidacy by Leita Kleine, MS, CCC-SLP. At this time, Pt. does not appear to demonstrate medical necessity to justify in hospital rehabilitation/CIR; however, workup is ongoing. I will not pursue a rehab consult for this Pt. At this time, but CIR can rescreen once work up is complete.  Recommend other rehab venues to be considered if Pt. Is ready for d/c.  Please contact me with any questions.  Leita Kleine, MS, CCC-SLP Rehab Admissions Coordinator  [phone] (celll) [phone] (office)

## 2024-11-01 ENCOUNTER — Ambulatory Visit: Admitting: Neurology

## 2024-11-01 ENCOUNTER — Encounter: Payer: Self-pay | Admitting: Neurology

## 2024-11-01 DIAGNOSIS — N3000 Acute cystitis without hematuria: Secondary | ICD-10-CM | POA: Diagnosis not present

## 2024-11-01 MED ORDER — BACLOFEN 10 MG PO TABS
20.0000 mg | ORAL_TABLET | Freq: Three times a day (TID) | ORAL | Status: DC | PRN
  Administered 2024-11-01 – 2024-11-07 (×12): 20 mg via ORAL
  Filled 2024-11-01 (×13): qty 2

## 2024-11-01 NOTE — Progress Notes (Signed)
 PROGRESS NOTE    Mary Villanueva  FMW:[id number] DOB: [birth year] DOA: 10/29/2024 PCP: Larnell Hamilton, MD    Brief Narrative:   This 68 y.o. female with PMH significant for Multiple sclerosis,  wheelchair-bound, history of MS for 40 years initially stable however progressively getting worse over last 10 years, chronic weakness of the bilateral lower extremities, gait abnormality, impaired mobility, spasticity, depression, anemia, anxiety, overactive bladder, CKD stage IIIa, hypothyroidism, and essential hypertension presented to the ED with progressively worsening generalized weakness for 3 days.  Patient had a fall from the wheelchair 3 days ago and had UTI 1 week ago.  She has been taking antibiotics prescribed by PCP without any improvement.  Patient reports since the fall her mobility is more restricted and She is unable to bear weight specifically on the left lower extremity.  She has developed left ankle swelling and pain.  She was hemodynamically stable in the ED.  UA consistent with UTI.  Patient had MRI, C-spine T-spine and lumbar spine.  MRI > No new or enhancing lesions.  Similar age advanced white matter T2 hyperintensities compatible with known multiple sclerosis.  Patient was admitted for further evaluation and was continued on IV antibiotics.   Assessment & Plan:   Acute cystitis: Patient presented to the ED with recurrent falls, worsening bilateral lower extremity weakness. She was diagnosed with UTI and started on oral antibiotics last week. UA consistent with UTI. Continue IV ceftriaxone . Follow-up blood cultures, UC not sent in the ER,  Change antibiotics according Cultures results.  History multiple sclerosis: History of chronic lower extremity weakness,  wheelchair-bound at baseline. Recent history of recurrent falls due to bilateral lower extremity weakness. History of gait abnormality. Wheelchair dependence Chronic muscle spasticity. She presented with worsening  bilateral extremities weakness, frequent falls while transferring  from wheelchair to bed.   Patient denies any blurry vision, headache, ataxia, limb paresthesia, chest pain, abdominal pain, shortness of breath, ophthalmoplegia, vertigo, hearing loss, sensory change hyperreflexia. In the ED extensive imaging has been completed. Chest x-ray >  No active disease process. X-ray hip  > No evidence of fracture or dislocation. X-ray of the left knee >  No evidence of fracture or dislocation. MRI brain > no acute intra cranial abnormality.  No new or enhancing lesion. MRI C-spine ,  thoracic spine and lumbar spine >  No evidence of abnormal enhancement. Continue baclofen  10 mg 3 times daily and tizanidine  at bedtime. At this time there is no concern for MS flare. Obtain PT and OT for evaluation of weakness and balance.  History of hypothyroidism: Continue levothyroxine .  History of depression: Continue Prozac .   History of overactive bladder: Continue In-N-Out cath as needed and oxybutynin .   History of CKD stage IIIa: Stable renal function.  Avoid nephrotoxic medications.   History of essential hypertension Continue irbesartan .  Left ankle swelling: Follow-up venous duplex left lower extremity.  DVT prophylaxis: Lovenox  Code Status: Full code Family Communication: No family at bedside Disposition Plan:   Status is: Inpatient    Consultants:  None  Procedures:  MRI  Antimicrobials:  Anti-infectives (From admission, onward)    Start     Dose/Rate Route Frequency Ordered Stop   10/30/24 0600  cefTRIAXone  (ROCEPHIN ) 1 g in sodium chloride  0.9 % 100 mL IVPB        1 g 200 mL/hr over 30 Minutes Intravenous Every 24 hours 10/29/24 2056 11/05/24 0559   10/29/24 1730  cefTRIAXone  (ROCEPHIN ) 2 g in sodium chloride  0.9 %  100 mL IVPB        2 g 200 mL/hr over 30 Minutes Intravenous  Once 10/29/24 1728 10/29/24 1837      Subjective:  Patient in bed, appears comfortable, denies  any headache, no fever, no chest pain or pressure, no shortness of breath , no abdominal pain. No new focal weakness.  Continues to have generalized weakness eagerly waiting for PT to show up.   Objective: Vitals:   10/31/24 1640 10/31/24 2044 11/01/24 0030 11/01/24 0454  BP: (!) 116/59 (!) 124/58 105/61 119/67  Pulse: 76 85 97 87  Resp: 20 19 (!) 22 (!) 21  Temp: 98.2 F (36.8 C)  98.6 F (37 C)   TempSrc: Oral  Oral   SpO2: 96% 98% 98% 94%  Weight:      Height:        Intake/Output Summary (Last 24 hours) at 11/01/2024 0724 Last data filed at 10/31/2024 2044 Gross per 24 hour  Intake --  Output 450 ml  Net -450 ml   Filed Weights   10/29/24 1728  Weight: 65.8 kg    Examination:  Awake Alert, No new F.N deficits, Normal affect [place].AT,PERRAL Supple Neck, No JVD,   Symmetrical Chest wall movement, Good air movement bilaterally, CTAB RRR,No Gallops, Rubs or new Murmurs,  +ve B.Sounds, Abd Soft, No tenderness,   No Cyanosis, Clubbing or edema      Data Review:   Patient Lines/Drains/Airways Status     Active Line/Drains/Airways     Name Placement date Placement time Site Days   Peripheral IV 10/29/24 Anterior;Right Forearm 10/29/24  1807  Forearm  3   External Urinary Catheter 10/30/24  1230  --  2             Inpatient Medications  Scheduled Meds:  baclofen   10 mg Oral TID   enoxaparin  (LOVENOX ) injection  40 mg Subcutaneous Q24H   FLUoxetine   60 mg Oral Daily   levothyroxine   50 mcg Oral Q0600   oxybutynin   10 mg Oral QHS   rosuvastatin   20 mg Oral QHS   sodium chloride  flush  3 mL Intravenous Q12H   sodium chloride  flush  3 mL Intravenous Q12H   tiZANidine   4 mg Oral QHS   Continuous Infusions:  cefTRIAXone  (ROCEPHIN )  IV 1 g (11/01/24 0501)   PRN Meds:.acetaminophen  **OR** acetaminophen , ondansetron  **OR** ondansetron  (ZOFRAN ) IV, sodium chloride  flush  DVT Prophylaxis  enoxaparin  (LOVENOX ) injection 40 mg Start: 10/29/24 2200 SCDs Start:  10/29/24 2057 Place TED hose Start: 10/29/24 2057    Recent Labs  Lab 10/28/24 1830 10/29/24 1807 10/30/24 0500 10/31/24 0701  WBC 5.5 6.3 5.1 5.5  HGB 12.1 12.0 12.0 12.3  HCT 36.4 36.3 37.5 36.2  PLT 220 225 200 210  MCV 93.6 94.5 98.4 92.1  MCH 31.1 31.3 31.5 31.3  MCHC 33.2 33.1 32.0 34.0  RDW 12.5 12.6 12.7 12.4  LYMPHSABS 0.8 0.8  --   --   MONOABS 0.6 0.7  --   --   EOSABS 0.0 0.1  --   --   BASOSABS 0.0 0.0  --   --     Recent Labs  Lab 10/28/24 1830 10/29/24 1804 10/29/24 1953 10/30/24 0500 10/31/24 0701  NA 137  --  140 137 139  K 4.6  --  4.4 4.1 4.2  CL 105  --  106 107 106  CO2 25  --  21* 20* 26  ANIONGAP 7  --  13 10  7  GLUCOSE 91  --  86 80 91  BUN 19  --  22 17 15   CREATININE 1.18*  --  1.23* 1.03* 1.10*  AST  --   --  30 31  --   ALT  --   --  41 35  --   ALKPHOS  --   --  58 52  --   BILITOT  --   --  0.3 0.5  --   ALBUMIN  --   --  3.1* 3.0*  --   LATICACIDVEN  --  0.8  --   --   --   INR 1.0  --   --   --   --   MG  --   --   --   --  2.1  PHOS  --   --   --   --  3.9  CALCIUM  8.1*  --  8.5* 7.7* 8.5*      Recent Labs  Lab 10/28/24 1830 10/29/24 1804 10/29/24 1953 10/30/24 0500 10/31/24 0701  LATICACIDVEN  --  0.8  --   --   --   INR 1.0  --   --   --   --   MG  --   --   --   --  2.1  CALCIUM  8.1*  --  8.5* 7.7* 8.5*    --------------------------------------------------------------------------------------------------------------- No results found for: CHOL, HDL, LDLCALC, LDLDIRECT, TRIG, CHOLHDL  Lab Results  Component Value Date   HGBA1C 5.6 08/02/2020   No results for input(s): TSH, T4TOTAL, FREET4, T3FREE, THYROIDAB in the last 72 hours. No results for input(s): VITAMINB12, FOLATE, FERRITIN, TIBC, IRON, RETICCTPCT in the last 72 hours. ------------------------------------------------------------------------------------------------------------------ Cardiac Enzymes No results for  input(s): CKMB, TROPONINI, MYOGLOBIN in the last 168 hours.  Invalid input(s): CK  Micro Results Recent Results (from the past 240 hours)  Urine Culture     Status: Abnormal   Collection Time: 10/28/24  6:30 PM   Specimen: Urine, Clean Catch  Result Value Ref Range Status   Specimen Description URINE, CLEAN CATCH  Final   Special Requests   Final    NONE Performed at [facility], [address] [place], [place] [address]    Culture MULTIPLE SPECIES PRESENT, SUGGEST RECOLLECTION (A)  Final   Report Status 10/30/2024 FINAL  Final  Blood culture (routine x 2)     Status: None (Preliminary result)   Collection Time: 10/29/24  5:14 PM   Specimen: BLOOD  Result Value Ref Range Status   Specimen Description BLOOD SITE NOT SPECIFIED  Final   Special Requests   Final    BOTTLES DRAWN AEROBIC AND ANAEROBIC Blood Culture results may not be optimal due to an inadequate volume of blood received in culture bottles   Culture   Final    NO GROWTH 2 DAYS Performed at [facility], [address] [place], [place] [address]    Report Status PENDING  Incomplete  Blood culture (routine x 2)     Status: None (Preliminary result)   Collection Time: 10/29/24  5:14 PM   Specimen: BLOOD  Result Value Ref Range Status   Specimen Description BLOOD SITE NOT SPECIFIED  Final   Special Requests   Final    BOTTLES DRAWN AEROBIC AND ANAEROBIC Blood Culture results may not be optimal due to an inadequate volume of blood received in culture bottles   Culture   Final    NO GROWTH 2  DAYS Performed at [facility], [address] [place], [place] [address]    Report Status PENDING  Incomplete    Radiology Reports  VAS US  LOWER EXTREMITY VENOUS (DVT) (ONLY [facility] & [facility]) Result Date: 10/30/2024  Lower Venous DVT Study Patient Name:  Oneta M Vogelgesang  Date of Exam:   10/30/2024 Medical Rec #: [id number]            Accession #:    [id number] Date of Birth: 01-Jul-1956            Patient  Gender: F Patient Age:   88 years Exam Location:  [facility] Procedure:      VAS US  LOWER EXTREMITY VENOUS (DVT) Referring Phys: MICAELA SUNDIL --------------------------------------------------------------------------------  Indications: Pain, and weakness, recent fall.  Limitations: Unable to properly position. Comparison Study: Previous study of the bilateral lower extremities on                   1.14.2025. Performing Technologist: Edilia Elden Appl  Examination Guidelines: A complete evaluation includes B-mode imaging, spectral Doppler, color Doppler, and power Doppler as needed of all accessible portions of each vessel. Bilateral testing is considered an integral part of a complete examination. Limited examinations for reoccurring indications may be performed as noted. The reflux portion of the exam is performed with the patient in reverse Trendelenburg.  +-----+---------------+---------+-----------+----------+--------------+ RIGHTCompressibilityPhasicitySpontaneityPropertiesThrombus Aging +-----+---------------+---------+-----------+----------+--------------+ CFV  Full           Yes      Yes                                 +-----+---------------+---------+-----------+----------+--------------+ SFJ  Full           Yes      Yes                                 +-----+---------------+---------+-----------+----------+--------------+   +---------+---------------+---------+-----------+----------+-------------------+ LEFT     CompressibilityPhasicitySpontaneityPropertiesThrombus Aging      +---------+---------------+---------+-----------+----------+-------------------+ CFV      Full           Yes      Yes                                      +---------+---------------+---------+-----------+----------+-------------------+ SFJ      Full           Yes      Yes                                       +---------+---------------+---------+-----------+----------+-------------------+ FV Prox  Full                                                             +---------+---------------+---------+-----------+----------+-------------------+ FV Mid   Full                                                             +---------+---------------+---------+-----------+----------+-------------------+  FV DistalFull                                                             +---------+---------------+---------+-----------+----------+-------------------+ PFV      Full                                                             +---------+---------------+---------+-----------+----------+-------------------+ POP      Full           Yes      Yes                                      +---------+---------------+---------+-----------+----------+-------------------+ PTV      Full                                                             +---------+---------------+---------+-----------+----------+-------------------+ PERO                                                  Not well visualized +---------+---------------+---------+-----------+----------+-------------------+    Summary: RIGHT: - No evidence of common femoral vein obstruction.   LEFT: - There is no evidence of deep vein thrombosis in the lower extremity. However, portions of this examination were limited- see technologist comments above.  - No cystic structure found in the popliteal fossa.  *See table(s) above for measurements and observations. Electronically signed by Debby Robertson on 10/30/2024 at 4:36:32 PM.    Final       Signature  -   Lavada Stank M.D on 11/01/2024 at 7:24 AM   -  To page go to [email or web address]

## 2024-11-01 NOTE — Progress Notes (Signed)
 Inpatient Rehab Admissions Coordinator:   Per therapy recommendations, patient was screened for CIR candidacy by Leita Kleine, MS, CCC-SLP. At this time, Pt. does not appear to demonstrate medical necessity to justify in hospital rehabilitation/CIR. I also do not believe her insurance would approve AIR level care with current diagnoses, based on recent auth/denial trends. I will not pursue a rehab consult for this Pt.   Recommend other rehab venues to be pursued.  Please contact me with any questions.  Leita Kleine, MS, CCC-SLP Rehab Admissions Coordinator  [phone] (celll) [phone] (office)

## 2024-11-01 NOTE — Plan of Care (Signed)
   Problem: Elimination: Goal: Will not experience complications related to bowel motility Outcome: Progressing   Problem: Skin Integrity: Goal: Risk for impaired skin integrity will decrease Outcome: Progressing

## 2024-11-01 NOTE — Plan of Care (Signed)

## 2024-11-02 MED ORDER — LACTATED RINGERS IV SOLN: INTRAVENOUS | Status: AC

## 2024-11-02 NOTE — NC FL2 (Signed)
 [place]  MEDICAID FL2 LEVEL OF CARE FORM     IDENTIFICATION  Patient Name: Mary Villanueva Birthdate: 1956-03-11 Sex: female Admission Date (Current Location): 10/29/2024  [facility] and [place] Number:  Producer, Television/film/video and Address:  [facility] [place], [address], [place], [place] [address]      Provider Number: [id number]  Attending Physician Name and Address:  Dennise Lavada POUR, MD  Relative Name and Phone Number:  Orchard,Larry Brother   [phone]    Current Level of Care: Hospital Recommended Level of Care: Skilled Nursing Facility Prior Approval Number:    Date Approved/Denied:   PASRR Number: [id number]  Discharge Plan: SNF    Current Diagnoses: Patient Active Problem List   Diagnosis Date Noted   Acute cystitis 10/29/2024   Lower extremity weakness 10/29/2024   Wheelchair dependence 10/29/2024   History of depression 10/29/2024   Overactive bladder 10/29/2024   CKD (chronic kidney disease) stage 3, GFR 30-59 ml/min (HCC) 10/29/2024   Urinary tract infection without hematuria 04/29/2022   Steroid-induced hyperglycemia    AKI (acute kidney injury) 08/01/2020   Hypothyroidism 08/01/2020   Swelling of both lower extremities 09/09/2019   Gait abnormality 10/14/2018   Abdominal pain 06/16/2018   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   Essential hypertension    Depression    High cholesterol    Multiple sclerosis exacerbation (HCC)    Hypercholesteremia    History of multiple sclerosis (HCC) 03/22/2013    Orientation RESPIRATION BLADDER Height & Weight     Self, Time, Situation, Place  Normal Incontinent Weight: 65.8 kg Height:  5' (152.4 cm)  BEHAVIORAL SYMPTOMS/MOOD NEUROLOGICAL BOWEL NUTRITION STATUS      Continent Diet (heart healthy diet, thin liquids)  AMBULATORY STATUS COMMUNICATION OF NEEDS Skin   Extensive Assist Verbally Skin abrasions (leg)                       Personal Care  Assistance Level of Assistance  Bathing, Feeding, Dressing Bathing Assistance: Limited assistance Feeding assistance: Independent Dressing Assistance: Maximum assistance     Functional Limitations Info  Sight, Hearing, Speech Sight Info: Impaired (wears glasses) Hearing Info: Adequate Speech Info: Adequate    SPECIAL CARE FACTORS FREQUENCY  PT (By licensed PT), OT (By licensed OT)     PT Frequency: 5x week OT Frequency: 5x week            Contractures Contractures Info: Not present    Additional Factors Info  Code Status, Allergies, Psychotropic Code Status Info: full Allergies Info: none Psychotropic Info: prozac  60 mg daily         Current Medications (11/02/2024):  This is the current hospital active medication list Current Facility-Administered Medications  Medication Dose Route Frequency Provider Last Rate Last Admin   acetaminophen  (TYLENOL ) tablet 650 mg  650 mg Oral Q6H PRN Sundil, Subrina, MD       Or   acetaminophen  (TYLENOL ) suppository 650 mg  650 mg Rectal Q6H PRN Sundil, Subrina, MD       baclofen  (LIORESAL ) tablet 20 mg  20 mg Oral TID PRN Singh, Prashant K, MD   20 mg at 11/02/24 0557   cefTRIAXone  (ROCEPHIN ) 1 g in sodium chloride  0.9 % 100 mL IVPB  1 g Intravenous Q24H Sundil, Subrina, MD 200 mL/hr at 11/02/24 0602 1 g at 11/02/24 0602   enoxaparin  (LOVENOX ) injection 40 mg  40 mg Subcutaneous Q24H Sundil, Subrina, MD  40 mg at 11/01/24 2054   FLUoxetine  (PROZAC ) capsule 60 mg  60 mg Oral Daily Singh, Prashant K, MD   60 mg at 11/02/24 1009   lactated ringers  infusion   Intravenous Continuous Singh, Prashant K, MD 75 mL/hr at 11/02/24 1013 New Bag at 11/02/24 1013   levothyroxine  (SYNTHROID ) tablet 50 mcg  50 mcg Oral Q0600 Leotis Bogus, MD   50 mcg at 11/02/24 0557   ondansetron  (ZOFRAN ) tablet 4 mg  4 mg Oral Q6H PRN Sundil, Subrina, MD       Or   ondansetron  (ZOFRAN ) injection 4 mg  4 mg Intravenous Q6H PRN Sundil, Subrina, MD       oxybutynin   (DITROPAN -XL) 24 hr tablet 10 mg  10 mg Oral QHS Sundil, Subrina, MD   10 mg at 11/01/24 2054   rosuvastatin  (CRESTOR ) tablet 20 mg  20 mg Oral QHS Sundil, Subrina, MD   20 mg at 11/01/24 2054   sodium chloride  flush (NS) 0.9 % injection 3 mL  3 mL Intravenous Q12H Sundil, Subrina, MD   3 mL at 11/02/24 1009   sodium chloride  flush (NS) 0.9 % injection 3 mL  3 mL Intravenous Q12H Sundil, Subrina, MD   3 mL at 11/02/24 1009   sodium chloride  flush (NS) 0.9 % injection 3 mL  3 mL Intravenous PRN Sundil, Subrina, MD       tiZANidine  (ZANAFLEX ) tablet 4 mg  4 mg Oral QHS Sundil, Subrina, MD   4 mg at 11/01/24 2054     Discharge Medications: Please see discharge summary for a list of discharge medications.  Relevant Imaging Results:  Relevant Lab Results:   Additional Information SSN#[id number]  Landry DELENA Senters, RN

## 2024-11-02 NOTE — Progress Notes (Signed)
 PROGRESS NOTE    Mary Villanueva  FMW:[id number] DOB: 03-13-1956 DOA: 10/29/2024 PCP: Larnell Hamilton, MD    Brief Narrative:   This 68 y.o. female with PMH significant for Multiple sclerosis,  wheelchair-bound, history of MS for 40 years initially stable however progressively getting worse over last 10 years, chronic weakness of the bilateral lower extremities, gait abnormality, impaired mobility, spasticity, depression, anemia, anxiety, overactive bladder, CKD stage IIIa, hypothyroidism, and essential hypertension presented to the ED with progressively worsening generalized weakness for 3 days.  Patient had a fall from the wheelchair 3 days ago and had UTI 1 week ago.  She has been taking antibiotics prescribed by PCP without any improvement.  Patient reports since the fall her mobility is more restricted and She is unable to bear weight specifically on the left lower extremity.  She has developed left ankle swelling and pain.  She was hemodynamically stable in the ED.  UA consistent with UTI.  Patient had MRI, C-spine T-spine and lumbar spine.  MRI > No new or enhancing lesions.  Similar age advanced white matter T2 hyperintensities compatible with known multiple sclerosis.  Patient was admitted for further evaluation and was continued on IV antibiotics.   Assessment & Plan:   Acute cystitis along with dehydration: Patient presented to the ED with recurrent falls, worsening bilateral lower extremity weakness. She was diagnosed with UTI and started on oral antibiotics last week. Continue IV ceftriaxone .  Gentle hydration with IV fluids as dehydration might be playing a bigger role, Follow-up blood cultures, UC not sent in the ER,  Change antibiotics according Cultures results.  History multiple sclerosis: History of chronic lower extremity weakness,  wheelchair-bound at baseline. Recent history of recurrent falls due to bilateral lower extremity weakness. History of gait  abnormality. Wheelchair dependence Chronic muscle spasticity. She presented with worsening bilateral extremities weakness, frequent falls while transferring  from wheelchair to bed.   Patient denies any blurry vision, headache, ataxia, limb paresthesia, chest pain, abdominal pain, shortness of breath, ophthalmoplegia, vertigo, hearing loss, sensory change hyperreflexia. In the ED extensive imaging has been completed. Chest x-ray >  No active disease process. X-ray hip  > No evidence of fracture or dislocation. X-ray of the left knee >  No evidence of fracture or dislocation. MRI brain > no acute intra cranial abnormality.  No new or enhancing lesion. MRI C-spine ,  thoracic spine and lumbar spine >  No evidence of abnormal enhancement. Continue baclofen  10 mg 3 times daily and tizanidine  at bedtime. At this time there is no concern for MS flare. Obtain PT and OT for evaluation of weakness and balance.  She is agreeable for SNF if she qualifies.  Gradual improvement in weakness.  History of hypothyroidism: Continue levothyroxine .  History of depression: Continue Prozac .   History of overactive bladder: Continue In-N-Out cath as needed and oxybutynin .   History of CKD stage IIIa: Stable renal function.  Avoid nephrotoxic medications.   History of essential hypertension Continue irbesartan .  Left ankle swelling: Follow-up venous duplex left lower extremity.  DVT prophylaxis: Lovenox  Code Status: Full code Family Communication: No family at bedside Disposition Plan:   Status is: Inpatient    Consultants:  None  Procedures:  MRI  Antimicrobials:  Anti-infectives (From admission, onward)    Start     Dose/Rate Route Frequency Ordered Stop   10/30/24 0600  cefTRIAXone  (ROCEPHIN ) 1 g in sodium chloride  0.9 % 100 mL IVPB        1 g  200 mL/hr over 30 Minutes Intravenous Every 24 hours 10/29/24 2056 11/05/24 0559   10/29/24 1730  cefTRIAXone  (ROCEPHIN ) 2 g in sodium chloride   0.9 % 100 mL IVPB        2 g 200 mL/hr over 30 Minutes Intravenous  Once 10/29/24 1728 10/29/24 1837      Subjective: Patient in bed, appears comfortable, denies any headache, no fever, no chest pain or pressure, no shortness of breath , no abdominal pain. No focal weakness.   Objective: Vitals:   11/01/24 0454 11/01/24 1200 11/01/24 2252 11/02/24 0754  BP: 119/67 (!) 134/49 (!) 118/57 116/60  Pulse: 87 73 75   Resp: (!) 21 17 13    Temp:  99 F (37.2 C) 98.5 F (36.9 C) 98.5 F (36.9 C)  TempSrc:  Oral Oral Oral  SpO2: 94%     Weight:      Height:        Intake/Output Summary (Last 24 hours) at 11/02/2024 0900 Last data filed at 11/02/2024 0550 Gross per 24 hour  Intake --  Output 1200 ml  Net -1200 ml   Filed Weights   10/29/24 1728  Weight: 65.8 kg    Examination:  Awake Alert, No new F.N deficits, Normal affect Newman.AT,PERRAL Supple Neck, No JVD,   Symmetrical Chest wall movement, Good air movement bilaterally, CTAB RRR,No Gallops, Rubs or new Murmurs,  +ve B.Sounds, Abd Soft, No tenderness,   No Cyanosis, Clubbing or edema    Data Review:   Patient Lines/Drains/Airways Status     Active Line/Drains/Airways     Name Placement date Placement time Site Days   Peripheral IV 10/29/24 Anterior;Right Forearm 10/29/24  1807  Forearm  4   External Urinary Catheter 10/30/24  1230  --  3             Inpatient Medications  Scheduled Meds:  enoxaparin  (LOVENOX ) injection  40 mg Subcutaneous Q24H   FLUoxetine   60 mg Oral Daily   levothyroxine   50 mcg Oral Q0600   oxybutynin   10 mg Oral QHS   rosuvastatin   20 mg Oral QHS   sodium chloride  flush  3 mL Intravenous Q12H   sodium chloride  flush  3 mL Intravenous Q12H   tiZANidine   4 mg Oral QHS   Continuous Infusions:  cefTRIAXone  (ROCEPHIN )  IV 1 g (11/02/24 0602)   lactated ringers      PRN Meds:.acetaminophen  **OR** acetaminophen , baclofen , ondansetron  **OR** ondansetron  (ZOFRAN ) IV, sodium chloride   flush  DVT Prophylaxis  enoxaparin  (LOVENOX ) injection 40 mg Start: 10/29/24 2200 SCDs Start: 10/29/24 2057 Place TED hose Start: 10/29/24 2057    Recent Labs  Lab 10/28/24 1830 10/29/24 1807 10/30/24 0500 10/31/24 0701  WBC 5.5 6.3 5.1 5.5  HGB 12.1 12.0 12.0 12.3  HCT 36.4 36.3 37.5 36.2  PLT 220 225 200 210  MCV 93.6 94.5 98.4 92.1  MCH 31.1 31.3 31.5 31.3  MCHC 33.2 33.1 32.0 34.0  RDW 12.5 12.6 12.7 12.4  LYMPHSABS 0.8 0.8  --   --   MONOABS 0.6 0.7  --   --   EOSABS 0.0 0.1  --   --   BASOSABS 0.0 0.0  --   --     Recent Labs  Lab 10/28/24 1830 10/29/24 1804 10/29/24 1953 10/30/24 0500 10/31/24 0701  NA 137  --  140 137 139  K 4.6  --  4.4 4.1 4.2  CL 105  --  106 107 106  CO2 25  --  21* 20* 26  ANIONGAP 7  --  13 10 7   GLUCOSE 91  --  86 80 91  BUN 19  --  22 17 15   CREATININE 1.18*  --  1.23* 1.03* 1.10*  AST  --   --  30 31  --   ALT  --   --  41 35  --   ALKPHOS  --   --  58 52  --   BILITOT  --   --  0.3 0.5  --   ALBUMIN  --   --  3.1* 3.0*  --   LATICACIDVEN  --  0.8  --   --   --   INR 1.0  --   --   --   --   MG  --   --   --   --  2.1  PHOS  --   --   --   --  3.9  CALCIUM  8.1*  --  8.5* 7.7* 8.5*      Recent Labs  Lab 10/28/24 1830 10/29/24 1804 10/29/24 1953 10/30/24 0500 10/31/24 0701  LATICACIDVEN  --  0.8  --   --   --   INR 1.0  --   --   --   --   MG  --   --   --   --  2.1  CALCIUM  8.1*  --  8.5* 7.7* 8.5*    --------------------------------------------------------------------------------------------------------------- No results found for: CHOL, HDL, LDLCALC, LDLDIRECT, TRIG, CHOLHDL  Lab Results  Component Value Date   HGBA1C 5.6 08/02/2020   No results for input(s): TSH, T4TOTAL, FREET4, T3FREE, THYROIDAB in the last 72 hours. No results for input(s): VITAMINB12, FOLATE, FERRITIN, TIBC, IRON, RETICCTPCT in the last 72  hours. ------------------------------------------------------------------------------------------------------------------ Cardiac Enzymes No results for input(s): CKMB, TROPONINI, MYOGLOBIN in the last 168 hours.  Invalid input(s): CK  Micro Results Recent Results (from the past 240 hours)  Urine Culture     Status: Abnormal   Collection Time: 10/28/24  6:30 PM   Specimen: Urine, Clean Catch  Result Value Ref Range Status   Specimen Description URINE, CLEAN CATCH  Final   Special Requests   Final    NONE Performed at [facility], [address] [place], [place] [address]    Culture MULTIPLE SPECIES PRESENT, SUGGEST RECOLLECTION (A)  Final   Report Status 10/30/2024 FINAL  Final  Blood culture (routine x 2)     Status: None (Preliminary result)   Collection Time: 10/29/24  5:14 PM   Specimen: BLOOD  Result Value Ref Range Status   Specimen Description BLOOD SITE NOT SPECIFIED  Final   Special Requests   Final    BOTTLES DRAWN AEROBIC AND ANAEROBIC Blood Culture results may not be optimal due to an inadequate volume of blood received in culture bottles   Culture   Final    NO GROWTH 4 DAYS Performed at [facility], [address], [place], [place] [address]    Report Status PENDING  Incomplete  Blood culture (routine x 2)     Status: None (Preliminary result)   Collection Time: 10/29/24  5:14 PM   Specimen: BLOOD  Result Value Ref Range Status   Specimen Description BLOOD SITE NOT SPECIFIED  Final   Special Requests   Final    BOTTLES DRAWN AEROBIC AND ANAEROBIC Blood Culture results may not be optimal due to an inadequate volume of blood received in culture bottles  Culture   Final    NO GROWTH 4 DAYS Performed at [facility], [address], [place], [place] [address]    Report Status PENDING  Incomplete    Radiology Reports  No results found.     Signature  -   Lavada Stank M.D on 11/02/2024 at 9:00 AM   -  To page go to  [email or web address]

## 2024-11-02 NOTE — Plan of Care (Signed)
  Problem: Safety: Goal: Ability to remain free from injury will improve Outcome: Progressing   Problem: Coping: Goal: Level of anxiety will decrease Outcome: Progressing   Problem: Health Behavior/Discharge Planning: Goal: Ability to manage health-related needs will improve Outcome: Progressing

## 2024-11-02 NOTE — TOC Initial Note (Addendum)
 Transition of Care [facility]) - Initial/Assessment Note    Patient Details  Name: Mary Villanueva MRN: [id number] Date of Birth: 11-12-1956  Transition of Care [facility]) CM/SW Contact:    Landry DELENA Senters, RN Phone Number: 11/02/2024, 11:01 AM  Clinical Narrative:                 RR:pwrmzjdzi weakness and falls; of note, recent dx UTI. LLE imaging negative for acute injury. Workup for acute cystitis. Brain and lumbar MRI compatible with known MS, no new or enhancing lesions identified.   Patient lives with brother, who provides support at home and transportation, will likely be transportation home at d/c.    DME reviewed, has PCP, manages own medications at home. Brother and sister's phone numbers reviewed and updated in chart.   Therapy recommendation for SNF. Patient info faxed out, with patient permission, for SNF. Currently waiting for response.  CM will continue to follow.   Expected Discharge Plan: Skilled Nursing Facility Barriers to Discharge: Continued Medical Work up   Patient Goals and CMS Choice   CMS Medicare.gov Compare Post Acute Care list provided to:: Patient Choice offered to / list presented to : Patient      Expected Discharge Plan and Services       Living arrangements for the past 2 months: Single Family Home                                      Prior Living Arrangements/Services Living arrangements for the past 2 months: Single Family Home Lives with:: Self, Siblings Patient language and need for interpreter reviewed:: Yes Do you feel safe going back to the place where you live?: Yes      Need for Family Participation in Patient Care: Yes (Comment) Care giver support system in place?: Yes (comment) Current home services: DME (cane, W/C, BSC, rolling walker, rollator) Criminal Activity/Legal Involvement Pertinent to Current Situation/Hospitalization: No - Comment as needed  Activities of Daily Living   ADL Screening (condition at time of  admission) Independently performs ADLs?: No Does the patient have a NEW difficulty with bathing/dressing/toileting/self-feeding that is expected to last >3 days?: Yes (Initiates electronic notice to provider for possible OT consult) Does the patient have a NEW difficulty with getting in/out of bed, walking, or climbing stairs that is expected to last >3 days?: Yes (Initiates electronic notice to provider for possible PT consult) Does the patient have a NEW difficulty with communication that is expected to last >3 days?: No Is the patient deaf or have difficulty hearing?: No Does the patient have difficulty seeing, even when wearing glasses/contacts?: No Does the patient have difficulty concentrating, remembering, or making decisions?: No  Permission Sought/Granted                  Emotional Assessment Appearance:: Developmentally appropriate Attitude/Demeanor/Rapport: Engaged Affect (typically observed): Calm Orientation: : Oriented to Self, Oriented to Place, Oriented to  Time, Oriented to Situation Alcohol / Substance Use: Not Applicable Psych Involvement: No (comment)  Admission diagnosis:  Acute cystitis [N30.00] Lower urinary tract infectious disease [N39.0] Patient Active Problem List   Diagnosis Date Noted   Acute cystitis 10/29/2024   Lower extremity weakness 10/29/2024   Wheelchair dependence 10/29/2024   History of depression 10/29/2024   Overactive bladder 10/29/2024   CKD (chronic kidney disease) stage 3, GFR 30-59 ml/min (HCC) 10/29/2024   Urinary tract infection without hematuria  04/29/2022   Steroid-induced hyperglycemia    AKI (acute kidney injury) 08/01/2020   Hypothyroidism 08/01/2020   Swelling of both lower extremities 09/09/2019   Gait abnormality 10/14/2018   Abdominal pain 06/16/2018   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 ([address]), [place] - [address] [place] ([place]) [place] [address] Phone: [phone] Fax: [phone]  [facility]) - [place], [place] - [address] [facility] [address] [place] [address] [place] [address] Phone: [phone] Fax: [phone]  [facility] - [place], [place] - [address] [place] [address] Phone: [phone] Fax: [phone]  [facility] [address] [place], [place] - [address] [place] [address] Phone: [phone] Fax: [phone]     Social Drivers of Health (SDOH) Social History: SDOH Screenings   Food Insecurity: No Food Insecurity (10/30/2024)  Housing: Low Risk  (10/30/2024)  Transportation Needs: No Transportation Needs (10/30/2024)  Utilities: Not At Risk (10/30/2024)  Social Connections: Moderately Integrated (10/30/2024)  Tobacco Use: Medium Risk (10/29/2024)   SDOH Interventions:     Readmission Risk Interventions     No data to display

## 2024-11-02 NOTE — Plan of Care (Signed)
   Problem: Clinical Measurements: Goal: Ability to maintain clinical measurements within normal limits will improve Outcome: Progressing Goal: Will remain free from infection Outcome: Progressing Goal: Diagnostic test results will improve Outcome: Progressing Goal: Respiratory complications will improve Outcome: Progressing

## 2024-11-02 NOTE — Progress Notes (Addendum)
 Physical Therapy Treatment Patient Details Name: Mary Villanueva MRN: [id number] DOB: 1956/08/15 Today's Date: 11/02/2024   History of Present Illness 68 y.o. female admitted 10/29/24 with increased weakness and falls; of note, recent dx UTI. LLE imaging negative for acute injury. Workup for acute cystitis. Brain and lumbar MRI compatible with known MS, no new or enhancing lesions identified. PMH includes multiple sclerosis, spasticity, overactive bladder, CKD, essential HTN, anemia, depression.    PT Comments  Pt received in supine and agreeable to session. Pt requires increased assist to stand to stedy and is unable to reach a full upright posture this session due to R knee adduction and flexion. Pt reports L foot supination is baseline, but R knee instability is acute. Pt instructed in BLE exercises for BLE strength. Noted CIR signed off, so discharge recommendations updated to continued inpatient follow up therapy, <3 hours/day after discussion with supervising PT Logan B.  Pt continues to benefit from PT services to progress toward functional mobility goals.    If plan is discharge home, recommend the following: A lot of help with walking and/or transfers;A lot of help with bathing/dressing/bathroom;Assistance with cooking/housework;Help with stairs or ramp for entrance;Assist for transportation   Can travel by private vehicle     No  Equipment Recommendations   (tbd next venue)    Recommendations for Other Services       Precautions / Restrictions Precautions Precautions: Fall Recall of Precautions/Restrictions: Intact Restrictions Weight Bearing Restrictions Per Provider Order: No     Mobility  Bed Mobility Overal bed mobility: Needs Assistance Bed Mobility: Supine to Sit     Supine to sit: HOB elevated, Used rails, Contact guard     General bed mobility comments: increased time, effort, and use of bed features. No physical assist    Transfers Overall transfer  level: Needs assistance Equipment used: Ambulation equipment used Transfers: Sit to/from Stand, Bed to chair/wheelchair/BSC Sit to Stand: Max assist, Via lift equipment           General transfer comment: STS from elevated EOB with max A for rise and unable to reach full upright stance due to R knee flexion and adduction and L foot supination. Second stand from stedy paddles pt only able to clear hips enough to move pads with decreased eccentric control to recliner Transfer via Lift Equipment: Stedy  Ambulation/Gait                   Stairs             Wheelchair Mobility     Tilt Bed    Modified Rankin (Stroke Patients Only)       Balance Overall balance assessment: Needs assistance Sitting-balance support: No upper extremity supported, Feet supported Sitting balance-Leahy Scale: Fair Sitting balance - Comments: EOB   Standing balance support: Bilateral upper extremity supported, During functional activity, Reliant on assistive device for balance Standing balance-Leahy Scale: Zero Standing balance comment: dependent on stedy support and unable to reach full upright posture                            Communication Communication Communication: No apparent difficulties  Cognition Arousal: Alert Behavior During Therapy: WFL for tasks assessed/performed   PT - Cognitive impairments: No apparent impairments                         Following commands: Intact  Cueing Cueing Techniques: Verbal cues  Exercises General Exercises - Lower Extremity Long Arc Quad: AROM, Seated, Both, 10 reps Hip ABduction/ADduction: AROM, Seated, Right, 5 reps Hip Flexion/Marching: AROM, Seated, Both, 5 reps    General Comments        Pertinent Vitals/Pain Pain Assessment Pain Assessment: No/denies pain     PT Goals (current goals can now be found in the care plan section) Acute Rehab PT Goals Patient Stated Goal: regain strength so she can  return home PT Goal Formulation: With patient Time For Goal Achievement: 11/13/24 Progress towards PT goals: Progressing toward goals    Frequency    Min 2X/week       AM-PAC PT 6 Clicks Mobility   Outcome Measure  Help needed turning from your back to your side while in a flat bed without using bedrails?: A Little Help needed moving from lying on your back to sitting on the side of a flat bed without using bedrails?: A Little Help needed moving to and from a bed to a chair (including a wheelchair)?: A Lot Help needed standing up from a chair using your arms (e.g., wheelchair or bedside chair)?: Total Help needed to walk in hospital room?: Total Help needed climbing 3-5 steps with a railing? : Total 6 Click Score: 11    End of Session Equipment Utilized During Treatment: Gait belt Activity Tolerance: Patient tolerated treatment well Patient left: in chair;with call bell/phone within reach;with chair alarm set Nurse Communication: Mobility status;Need for lift equipment PT Visit Diagnosis: Other abnormalities of gait and mobility (R26.89);Muscle weakness (generalized) (M62.81)     Time: 9090-9069 PT Time Calculation (min) (ACUTE ONLY): 21 min  Charges:    $Therapeutic Activity: 8-22 mins PT General Charges $$ ACUTE PT VISIT: 1 Visit                    Darryle George, PTA Acute Rehabilitation Services Secure Chat Preferred  [phone]    Darryle George 11/02/2024, 9:54 AM

## 2024-11-03 LAB — CBC WITH DIFFERENTIAL/PLATELET
Abs Immature Granulocytes: 0.03 K/uL (ref 0.00–0.07)
Basophils Absolute: 0 K/uL (ref 0.0–0.1)
Basophils Relative: 0 %
Eosinophils Absolute: 0.1 K/uL (ref 0.0–0.5)
Eosinophils Relative: 2 %
HCT: 33.5 % — ABNORMAL LOW (ref 36.0–46.0)
Hemoglobin: 11.4 g/dL — ABNORMAL LOW (ref 12.0–15.0)
Immature Granulocytes: 1 %
Lymphocytes Relative: 13 %
Lymphs Abs: 0.8 K/uL (ref 0.7–4.0)
MCH: 31.5 pg (ref 26.0–34.0)
MCHC: 34 g/dL (ref 30.0–36.0)
MCV: 92.5 fL (ref 80.0–100.0)
Monocytes Absolute: 0.6 K/uL (ref 0.1–1.0)
Monocytes Relative: 9 %
Neutro Abs: 4.8 K/uL (ref 1.7–7.7)
Neutrophils Relative %: 75 %
Platelets: 221 K/uL (ref 150–400)
RBC: 3.62 MIL/uL — ABNORMAL LOW (ref 3.87–5.11)
RDW: 12.4 % (ref 11.5–15.5)
WBC: 6.3 K/uL (ref 4.0–10.5)
nRBC: 0 % (ref 0.0–0.2)

## 2024-11-03 LAB — BASIC METABOLIC PANEL WITH GFR
Anion gap: 6 (ref 5–15)
BUN: 17 mg/dL (ref 8–23)
CO2: 26 mmol/L (ref 22–32)
Calcium: 8.1 mg/dL — ABNORMAL LOW (ref 8.9–10.3)
Chloride: 107 mmol/L (ref 98–111)
Creatinine, Ser: 1.22 mg/dL — ABNORMAL HIGH (ref 0.44–1.00)
GFR, Estimated: 48 mL/min — ABNORMAL LOW (ref >60)
Glucose, Bld: 119 mg/dL — ABNORMAL HIGH (ref 70–99)
Potassium: 4 mmol/L (ref 3.5–5.1)
Sodium: 139 mmol/L (ref 135–145)

## 2024-11-03 LAB — CULTURE, BLOOD (ROUTINE X 2)
Culture: NO GROWTH
Culture: NO GROWTH

## 2024-11-03 LAB — PHOSPHORUS: Phosphorus: 3.1 mg/dL (ref 2.5–4.6)

## 2024-11-03 LAB — MAGNESIUM: Magnesium: 2 mg/dL (ref 1.7–2.4)

## 2024-11-03 NOTE — Plan of Care (Signed)

## 2024-11-03 NOTE — TOC Progression Note (Signed)
 Transition of Care [facility]) - Progression Note    Patient Details  Name: Mary Villanueva MRN: [id number] Date of Birth: 12-Jan-1956  Transition [facility]) CM/SW Contact  Landry DELENA Senters, RN Phone Number: 11/03/2024, 2:51 PM  Clinical Narrative:     Patient has provided choice of [facility]. CM contacted [facility] to verify bed availability, and they report a bed is not available until Friday or Saturday.   CM did speak with patient and sister about this, asked for second choice that may have bed available sooner. They do not want to pursue SNF option other than [facility] at this time. Patient is aware she is here under OBS status and this could potentially increase her bill.   CM will start authorization for [employer].  CM will continue to follow.   Expected Discharge Plan: Skilled Nursing Facility Barriers to Discharge: Continued Medical Work up               Expected Discharge Plan and Services       Living arrangements for the past 2 months: Single Family Home                                       Social Drivers of Health (SDOH) Interventions SDOH Screenings   Food Insecurity: No Food Insecurity (10/30/2024)  Housing: Low Risk  (10/30/2024)  Transportation Needs: No Transportation Needs (10/30/2024)  Utilities: Not At Risk (10/30/2024)  Social Connections: Moderately Integrated (10/30/2024)  Tobacco Use: Medium Risk (10/29/2024)    Readmission Risk Interventions     No data to display

## 2024-11-03 NOTE — Progress Notes (Signed)
 PROGRESS NOTE    Mary Villanueva  FMW:[id number] DOB: Jun 27, 1956 DOA: 10/29/2024 PCP: Larnell Hamilton, MD    Brief Narrative:   This 68 y.o. female with PMH significant for Multiple sclerosis,  wheelchair-bound, history of MS for 40 years initially stable however progressively getting worse over last 10 years, chronic weakness of the bilateral lower extremities, gait abnormality, impaired mobility, spasticity, depression, anemia, anxiety, overactive bladder, CKD stage IIIa, hypothyroidism, and essential hypertension presented to the ED with progressively worsening generalized weakness for 3 days.  Patient had a fall from the wheelchair 3 days ago and had UTI 1 week ago.  She has been taking antibiotics prescribed by PCP without any improvement.  Patient reports since the fall her mobility is more restricted and She is unable to bear weight specifically on the left lower extremity.  She has developed left ankle swelling and pain.  She was hemodynamically stable in the ED.  UA consistent with UTI.  Patient had MRI, C-spine T-spine and lumbar spine.  MRI > No new or enhancing lesions.  Similar age advanced white matter T2 hyperintensities compatible with known multiple sclerosis.  Patient was admitted for further evaluation and was continued on IV antibiotics.   Assessment & Plan:   Acute cystitis along with dehydration: Patient presented to the ED with recurrent falls, worsening bilateral lower extremity weakness. She was diagnosed with UTI and started on oral antibiotics last week. Blood culture remained negative, unfortunately no urine culture was sent on admission -Treated with IV Rocephin , finished total of 5 days,   History multiple sclerosis: History of chronic lower extremity weakness,  wheelchair-bound at baseline. Recent history of recurrent falls due to bilateral lower extremity weakness. History of gait abnormality. Wheelchair dependence Chronic muscle spasticity. She  presented with worsening bilateral extremities weakness, frequent falls while transferring  from wheelchair to bed.   Patient denies any blurry vision, headache, ataxia, limb paresthesia, chest pain, abdominal pain, shortness of breath, ophthalmoplegia, vertigo, hearing loss, sensory change hyperreflexia. In the ED extensive imaging has been completed. Chest x-ray >  No active disease process. X-ray hip  > No evidence of fracture or dislocation. X-ray of the left knee >  No evidence of fracture or dislocation. MRI brain > no acute intra cranial abnormality.  No new or enhancing lesion. MRI C-spine ,  thoracic spine and lumbar spine >  No evidence of abnormal enhancement. Continue baclofen  10 mg 3 times daily and tizanidine  at bedtime. At this time there is no concern for MS flare. Obtain PT and OT for evaluation of weakness and balance.  She is agreeable for SNF if she qualifies.  Gradual improvement in weakness.  History of hypothyroidism: Continue levothyroxine .  History of depression: Continue Prozac .   History of overactive bladder: Continue In-N-Out cath as needed and oxybutynin .   History of CKD stage IIIa: Stable renal function.  Avoid nephrotoxic medications.   History of essential hypertension Continue irbesartan .  Left ankle swelling: Follow-up venous duplex left lower extremity.  DVT prophylaxis: Lovenox  Code Status: Full code Family Communication: No family at bedside Disposition Plan: Medically stable for discharge to SNF, still pending insurance approval     Consultants:  None  Procedures:  MRI  Antimicrobials:  Anti-infectives (From admission, onward)    Start     Dose/Rate Route Frequency Ordered Stop   10/30/24 0600  cefTRIAXone  (ROCEPHIN ) 1 g in sodium chloride  0.9 % 100 mL IVPB        1 g 200 mL/hr over 30  Minutes Intravenous Every 24 hours 10/29/24 2056 11/05/24 0559   10/29/24 1730  cefTRIAXone  (ROCEPHIN ) 2 g in sodium chloride  0.9 % 100 mL IVPB         2 g 200 mL/hr over 30 Minutes Intravenous  Once 10/29/24 1728 10/29/24 1837      Subjective:   In bed, appears comfortable, denies any new complaints    Objective: Vitals:   11/03/24 0023 11/03/24 0155 11/03/24 0414 11/03/24 0821  BP: (!) 87/67 (!) 104/50 (!) 121/54 (!) 116/58  Pulse: 72  69 70  Resp: 16 19    Temp: 97.7 F (36.5 C)  97.8 F (36.6 C) 98.2 F (36.8 C)  TempSrc: Oral  Oral Oral  SpO2:      Weight:      Height:        Intake/Output Summary (Last 24 hours) at 11/03/2024 1128 Last data filed at 11/03/2024 1025 Gross per 24 hour  Intake 667.48 ml  Output 1300 ml  Net -632.52 ml   Filed Weights   10/29/24 1728  Weight: 65.8 kg    Examination:  Awake Alert, Oriented X 3, CTAB RRR +ve B.Sounds, Abd Soft No Cyanosis, Clubbing or edema, No new Rash or bruise      Data Review:   Patient Lines/Drains/Airways Status     Active Line/Drains/Airways     Name Placement date Placement time Site Days   Peripheral IV 10/29/24 Anterior;Right Forearm 10/29/24  1807  Forearm  5   External Urinary Catheter 10/30/24  1230  --  4             Inpatient Medications  Scheduled Meds:  enoxaparin  (LOVENOX ) injection  40 mg Subcutaneous Q24H   FLUoxetine   60 mg Oral Daily   levothyroxine   50 mcg Oral Q0600   oxybutynin   10 mg Oral QHS   rosuvastatin   20 mg Oral QHS   sodium chloride  flush  3 mL Intravenous Q12H   sodium chloride  flush  3 mL Intravenous Q12H   tiZANidine   4 mg Oral QHS   Continuous Infusions:  cefTRIAXone  (ROCEPHIN )  IV Stopped (11/03/24 1013)   PRN Meds:.acetaminophen  **OR** acetaminophen , baclofen , ondansetron  **OR** ondansetron  (ZOFRAN ) IV, sodium chloride  flush  DVT Prophylaxis  enoxaparin  (LOVENOX ) injection 40 mg Start: 10/29/24 2200 SCDs Start: 10/29/24 2057 Place TED hose Start: 10/29/24 2057    Recent Labs  Lab 10/28/24 1830 10/29/24 1807 10/30/24 0500 10/31/24 0701 11/03/24 0359  WBC 5.5 6.3 5.1 5.5 6.3   HGB 12.1 12.0 12.0 12.3 11.4*  HCT 36.4 36.3 37.5 36.2 33.5*  PLT 220 225 200 210 221  MCV 93.6 94.5 98.4 92.1 92.5  MCH 31.1 31.3 31.5 31.3 31.5  MCHC 33.2 33.1 32.0 34.0 34.0  RDW 12.5 12.6 12.7 12.4 12.4  LYMPHSABS 0.8 0.8  --   --  0.8  MONOABS 0.6 0.7  --   --  0.6  EOSABS 0.0 0.1  --   --  0.1  BASOSABS 0.0 0.0  --   --  0.0    Recent Labs  Lab 10/28/24 1830 10/29/24 1804 10/29/24 1953 10/30/24 0500 10/31/24 0701 11/03/24 0359  NA 137  --  140 137 139 139  K 4.6  --  4.4 4.1 4.2 4.0  CL 105  --  106 107 106 107  CO2 25  --  21* 20* 26 26  ANIONGAP 7  --  13 10 7 6   GLUCOSE 91  --  86 80 91 119*  BUN  19  --  22 17 15 17   CREATININE 1.18*  --  1.23* 1.03* 1.10* 1.22*  AST  --   --  30 31  --   --   ALT  --   --  41 35  --   --   ALKPHOS  --   --  58 52  --   --   BILITOT  --   --  0.3 0.5  --   --   ALBUMIN  --   --  3.1* 3.0*  --   --   LATICACIDVEN  --  0.8  --   --   --   --   INR 1.0  --   --   --   --   --   MG  --   --   --   --  2.1 2.0  PHOS  --   --   --   --  3.9 3.1  CALCIUM  8.1*  --  8.5* 7.7* 8.5* 8.1*      Recent Labs  Lab 10/28/24 1830 10/29/24 1804 10/29/24 1953 10/30/24 0500 10/31/24 0701 11/03/24 0359  LATICACIDVEN  --  0.8  --   --   --   --   INR 1.0  --   --   --   --   --   MG  --   --   --   --  2.1 2.0  CALCIUM  8.1*  --  8.5* 7.7* 8.5* 8.1*    --------------------------------------------------------------------------------------------------------------- No results found for: CHOL, HDL, LDLCALC, LDLDIRECT, TRIG, CHOLHDL  Lab Results  Component Value Date   HGBA1C 5.6 08/02/2020   No results for input(s): TSH, T4TOTAL, FREET4, T3FREE, THYROIDAB in the last 72 hours. No results for input(s): VITAMINB12, FOLATE, FERRITIN, TIBC, IRON, RETICCTPCT in the last 72 hours. ------------------------------------------------------------------------------------------------------------------ Cardiac  Enzymes No results for input(s): CKMB, TROPONINI, MYOGLOBIN in the last 168 hours.  Invalid input(s): CK  Micro Results Recent Results (from the past 240 hours)  Urine Culture     Status: Abnormal   Collection Time: 10/28/24  6:30 PM   Specimen: Urine, Clean Catch  Result Value Ref Range Status   Specimen Description URINE, CLEAN CATCH  Final   Special Requests   Final    NONE Performed at [facility], [address] [place], [place] [address]    Culture MULTIPLE SPECIES PRESENT, SUGGEST RECOLLECTION (A)  Final   Report Status 10/30/2024 FINAL  Final  Blood culture (routine x 2)     Status: None   Collection Time: 10/29/24  5:14 PM   Specimen: BLOOD  Result Value Ref Range Status   Specimen Description BLOOD SITE NOT SPECIFIED  Final   Special Requests   Final    BOTTLES DRAWN AEROBIC AND ANAEROBIC Blood Culture results may not be optimal due to an inadequate volume of blood received in culture bottles   Culture   Final    NO GROWTH 5 DAYS Performed at [facility], [address] [place], [place] [address]    Report Status 11/03/2024 FINAL  Final  Blood culture (routine x 2)     Status: None   Collection Time: 10/29/24  5:14 PM   Specimen: BLOOD  Result Value Ref Range Status   Specimen Description BLOOD SITE NOT SPECIFIED  Final   Special Requests   Final    BOTTLES DRAWN AEROBIC AND ANAEROBIC Blood Culture results may not be optimal due to  an inadequate volume of blood received in culture bottles   Culture   Final    NO GROWTH 5 DAYS Performed at [facility], [address], [place], [place] [address]    Report Status 11/03/2024 FINAL  Final    Radiology Reports  No results found.     Signature  -   Brayton Lye M.D on 11/03/2024 at 11:28 AM   -  To page go to [email or web address]

## 2024-11-03 NOTE — Progress Notes (Signed)
 Occupational Therapy Treatment Patient Details Name: Mary Villanueva Polyak MRN: [id number] DOB: [birth year] Today's Date: 11/03/2024   History of present illness 68 y.o. female admitted 10/29/24 with increased weakness and falls; of note, recent dx UTI. LLE imaging negative for acute injury. Workup for acute cystitis. Brain and lumbar MRI compatible with known MS, no new or enhancing lesions identified. PMH includes multiple sclerosis, spasticity, overactive bladder, CKD, essential HTN, anemia, depression.   OT comments  Pt is progressing towards OT goals. Focus of session on progressing functional mobility, increasing independence with ADL tasks, and strengthening BUE for functional use. Pt required Mod A +2 with use of stedy for functional transfers, Mod A for ADL tasks, and Min A to engage in BUE HEP with theraband (exercises noted below). Pt continues to benefit from acute skilled OT services. Continue per POC.       If plan is discharge home, recommend the following:  Two people to help with walking and/or transfers;A lot of help with bathing/dressing/bathroom;Assistance with cooking/housework;Assist for transportation;Help with stairs or ramp for entrance   Equipment Recommendations  Other (comment) (defer to next venue)    Recommendations for Other Services      Precautions / Restrictions Precautions Precautions: Fall Recall of Precautions/Restrictions: Intact Restrictions Weight Bearing Restrictions Per Provider Order: No       Mobility Bed Mobility Overal bed mobility: Needs Assistance Bed Mobility: Supine to Sit     Supine to sit: Contact guard, HOB elevated, Used rails     General bed mobility comments: CGA and increased time for bed mobility. Verbal cues to scoot towards EOB.    Transfers Overall transfer level: Needs assistance Equipment used: Ambulation equipment used Transfers: Sit to/from Stand, Bed to chair/wheelchair/BSC Sit to Stand: Mod assist, +2 physical  assistance, Via lift equipment           General transfer comment: STS from bed with BUE support with stedy. Pt required Mod A +2 to stand. Pt required increased support to stand on second attempt over recliner. Decreased control of descent. Transfer via Lift Equipment: Stedy   Balance Overall balance assessment: Needs assistance Sitting-balance support: No upper extremity supported, Feet supported Sitting balance-Leahy Scale: Fair     Standing balance support: Bilateral upper extremity supported, During functional activity, Reliant on assistive device for balance Standing balance-Leahy Scale: Poor Standing balance comment: Dependent on stedy support to reach full upright position.                           ADL either performed or assessed with clinical judgement   ADL Overall ADL's : Needs assistance/impaired             Lower Body Bathing: Moderate assistance;Sitting/lateral leans Lower Body Bathing Details (indicate cue type and reason): Mod A to apply lotion to BLE in long sitting position in recliner Upper Body Dressing : Set up;Sitting                          Extremity/Trunk Assessment Upper Extremity Assessment Upper Extremity Assessment: Generalized weakness            Vision   Vision Assessment?: No apparent visual deficits   Perception     Praxis     Communication Communication Communication: No apparent difficulties   Cognition Arousal: Alert Behavior During Therapy: WFL for tasks assessed/performed Cognition: No apparent impairments  Following commands: Intact        Cueing   Cueing Techniques: Verbal cues  Exercises Exercises: General Upper Extremity, Shoulder General Exercises - Upper Extremity Shoulder Horizontal ABduction: AROM, Both, 5 reps, Theraband Theraband Level (Shoulder Horizontal Abduction): Level 1 (Yellow) Elbow Flexion: AROM, Both, 5 reps, Theraband Theraband  Level (Elbow Flexion): Level 1 (Yellow) Elbow Extension: AROM, Both, 5 reps, Theraband Theraband Level (Elbow Extension): Level 1 (Yellow) Shoulder Exercises Shoulder External Rotation: AROM, Both, 5 reps, Theraband Theraband Level (Shoulder External Rotation): Level 1 (Yellow)    Shoulder Instructions       General Comments VSS throughout session.    Pertinent Vitals/ Pain       Pain Assessment Pain Assessment: No/denies pain  Home Living                                          Prior Functioning/Environment              Frequency  Min 2X/week        Progress Toward Goals  OT Goals(current goals can now be found in the care plan section)  Progress towards OT goals: Progressing toward goals  Acute Rehab OT Goals Patient Stated Goal: to get better OT Goal Formulation: With patient Time For Goal Achievement: 11/14/24 Potential to Achieve Goals: Good ADL Goals Pt Will Perform Upper Body Dressing: with modified independence;sitting Pt Will Perform Lower Body Dressing: with min assist;with adaptive equipment;sitting/lateral leans Pt Will Transfer to Toilet: stand pivot transfer;with min assist;bedside commode Pt Will Perform Toileting - Clothing Manipulation and hygiene: with contact guard assist;sitting/lateral leans Pt/caregiver will Perform Home Exercise Program: Both right and left upper extremity;Increased strength;With written HEP provided  Plan      Co-evaluation                 AM-PAC OT 6 Clicks Daily Activity     Outcome Measure   Help from another person eating meals?: None Help from another person taking care of personal grooming?: A Little Help from another person toileting, which includes using toliet, bedpan, or urinal?: A Lot Help from another person bathing (including washing, rinsing, drying)?: A Lot Help from another person to put on and taking off regular upper body clothing?: A Little Help from another person to  put on and taking off regular lower body clothing?: A Lot 6 Click Score: 16    End of Session Equipment Utilized During Treatment: Gait belt  OT Visit Diagnosis: Muscle weakness (generalized) (M62.81);Unsteadiness on feet (R26.81);Other symptoms and signs involving the nervous system (R29.898)   Activity Tolerance Patient tolerated treatment well   Patient Left in chair;with call bell/phone within reach;with chair alarm set   Nurse Communication          Time: [phone] OT Time Calculation (min): 22 min  Charges: OT General Charges $OT Visit: 1 Visit OT Treatments $Therapeutic Activity: 8-22 mins  Maurilio CROME, OTR/L.  [facility] Acute Rehabilitation  Office: [phone]   Maurilio PARAS Suriah Peragine 11/03/2024, 1:42 PM

## 2024-11-04 MED ORDER — TIZANIDINE HCL 4 MG PO TABS
4.0000 mg | ORAL_TABLET | Freq: Three times a day (TID) | ORAL | Status: DC
  Administered 2024-11-04 – 2024-11-07 (×10): 4 mg via ORAL
  Filled 2024-11-04 (×12): qty 1

## 2024-11-04 MED ORDER — MAGNESIUM CITRATE PO SOLN
1.0000 | Freq: Once | ORAL | Status: AC
  Administered 2024-11-04: 1 via ORAL
  Filled 2024-11-04: qty 296

## 2024-11-04 MED ORDER — METHOCARBAMOL 500 MG PO TABS
500.0000 mg | ORAL_TABLET | Freq: Once | ORAL | Status: AC
  Administered 2024-11-04: 500 mg via ORAL
  Filled 2024-11-04: qty 1

## 2024-11-04 NOTE — Progress Notes (Signed)
 PROGRESS NOTE    Mary Villanueva  FMW:[id number] DOB: 05-Dec-1955 DOA: 10/29/2024 PCP: Larnell Hamilton, MD    Brief Narrative:   This 68 y.o. female with PMH significant for Multiple sclerosis,  wheelchair-bound, history of MS for 40 years initially stable however progressively getting worse over last 10 years, chronic weakness of the bilateral lower extremities, gait abnormality, impaired mobility, spasticity, depression, anemia, anxiety, overactive bladder, CKD stage IIIa, hypothyroidism, and essential hypertension presented to the ED with progressively worsening generalized weakness for 3 days.  Patient had a fall from the wheelchair 3 days ago and had UTI 1 week ago.  She has been taking antibiotics prescribed by PCP without any improvement.  Patient reports since the fall her mobility is more restricted and She is unable to bear weight specifically on the left lower extremity.  She has developed left ankle swelling and pain.  She was hemodynamically stable in the ED.  UA consistent with UTI.  Patient had MRI, C-spine T-spine and lumbar spine.  MRI > No new or enhancing lesions.  Similar age advanced white matter T2 hyperintensities compatible with known multiple sclerosis.  Patient was admitted for further evaluation and was continued on IV antibiotics.   Assessment & Plan:   Acute cystitis along with dehydration: Patient presented to the ED with recurrent falls, worsening bilateral lower extremity weakness. She was diagnosed with UTI and started on oral antibiotics last week. Blood culture remained negative, unfortunately no urine culture was sent on admission -Treated with IV Rocephin , finished total of 5 days,   History multiple sclerosis: History of chronic lower extremity weakness,  wheelchair-bound at baseline. Recent history of recurrent falls due to bilateral lower extremity weakness. History of gait abnormality. Wheelchair dependence Chronic muscle spasticity. She  presented with worsening bilateral extremities weakness, frequent falls while transferring  from wheelchair to bed.   Patient denies any blurry vision, headache, ataxia, limb paresthesia, chest pain, abdominal pain, shortness of breath, ophthalmoplegia, vertigo, hearing loss, sensory change hyperreflexia. In the ED extensive imaging has been completed. Chest x-ray >  No active disease process. X-ray hip  > No evidence of fracture or dislocation. X-ray of the left knee >  No evidence of fracture or dislocation. MRI brain > no acute intra cranial abnormality.  No new or enhancing lesion. MRI C-spine ,  thoracic spine and lumbar spine >  No evidence of abnormal enhancement. At this time there is no concern for MS flare. -D/OT consulted, recommendation for SNF placement  Lower extremity spasms - Potassium and magnesium  within normal limit -Continue baclofen  10 mg 3 times daily as needed, she reports worsening lower extremity spasm, so we will increase her tizanidine  to 3 times daily.  Constipation - Will start on bowel regimen  History of hypothyroidism: Continue levothyroxine .  History of depression: Continue Prozac .   History of overactive bladder: Continue In-N-Out cath as needed and oxybutynin .   History of CKD stage IIIa: Stable renal function.  Avoid nephrotoxic medications.   History of essential hypertension Continue irbesartan .  Left ankle swelling: Venous Dopplers was obtained, negative for DVT  DVT prophylaxis: Lovenox  Code Status: Full code Family Communication: No family at bedside Disposition Plan: Medically stable for discharge to SNF, still pending insurance approval and bed availability     Consultants:  None  Procedures:  MRI  Antimicrobials:  Anti-infectives (From admission, onward)    Start     Dose/Rate Route Frequency Ordered Stop   10/30/24 0600  cefTRIAXone  (ROCEPHIN ) 1 g in sodium  chloride 0.9 % 100 mL IVPB  Status:  Discontinued        1  g 200 mL/hr over 30 Minutes Intravenous Every 24 hours 10/29/24 2056 11/03/24 1129   10/29/24 1730  cefTRIAXone  (ROCEPHIN ) 2 g in sodium chloride  0.9 % 100 mL IVPB        2 g 200 mL/hr over 30 Minutes Intravenous  Once 10/29/24 1728 10/29/24 1837      Subjective:   In bed, comfortable Good air entry Abdomen soft Lower extremity with no edema   Objective: Vitals:   11/03/24 1922 11/03/24 2340 11/04/24 0403 11/04/24 0841  BP: (!) 109/56 101/61 124/60 (!) 124/52  Pulse:  90 (!) 101 89  Resp: 18 18 17 18   Temp: 98.3 F (36.8 C) 98.2 F (36.8 C) 99.1 F (37.3 C) 99.4 F (37.4 C)  TempSrc: Oral Oral Oral Oral  SpO2:  (!) 88% 95% 92%  Weight:      Height:        Intake/Output Summary (Last 24 hours) at 11/04/2024 1107 Last data filed at 11/04/2024 0943 Gross per 24 hour  Intake 5 ml  Output --  Net 5 ml   Filed Weights   10/29/24 1728  Weight: 65.8 kg    Examination:  Awake Alert, Oriented X 3, CTAB RRR +ve B.Sounds, Abd Soft No Cyanosis, Clubbing or edema, No new Rash or bruise      Data Review:   Patient Lines/Drains/Airways Status     Active Line/Drains/Airways     Name Placement date Placement time Site Days   Peripheral IV 10/29/24 Anterior;Right Forearm 10/29/24  1807  Forearm  6   External Urinary Catheter 10/30/24  1230  --  5             Inpatient Medications  Scheduled Meds:  enoxaparin  (LOVENOX ) injection  40 mg Subcutaneous Q24H   FLUoxetine   60 mg Oral Daily   levothyroxine   50 mcg Oral Q0600   magnesium  citrate  1 Bottle Oral Once   oxybutynin   10 mg Oral QHS   rosuvastatin   20 mg Oral QHS   sodium chloride  flush  3 mL Intravenous Q12H   sodium chloride  flush  3 mL Intravenous Q12H   tiZANidine   4 mg Oral TID   Continuous Infusions:   PRN Meds:.acetaminophen  **OR** acetaminophen , baclofen , ondansetron  **OR** ondansetron  (ZOFRAN ) IV, sodium chloride  flush  DVT Prophylaxis  enoxaparin  (LOVENOX ) injection 40 mg Start:  10/29/24 2200 SCDs Start: 10/29/24 2057 Place TED hose Start: 10/29/24 2057    Recent Labs  Lab 10/28/24 1830 10/29/24 1807 10/30/24 0500 10/31/24 0701 11/03/24 0359  WBC 5.5 6.3 5.1 5.5 6.3  HGB 12.1 12.0 12.0 12.3 11.4*  HCT 36.4 36.3 37.5 36.2 33.5*  PLT 220 225 200 210 221  MCV 93.6 94.5 98.4 92.1 92.5  MCH 31.1 31.3 31.5 31.3 31.5  MCHC 33.2 33.1 32.0 34.0 34.0  RDW 12.5 12.6 12.7 12.4 12.4  LYMPHSABS 0.8 0.8  --   --  0.8  MONOABS 0.6 0.7  --   --  0.6  EOSABS 0.0 0.1  --   --  0.1  BASOSABS 0.0 0.0  --   --  0.0    Recent Labs  Lab 10/28/24 1830 10/29/24 1804 10/29/24 1953 10/30/24 0500 10/31/24 0701 11/03/24 0359  NA 137  --  140 137 139 139  K 4.6  --  4.4 4.1 4.2 4.0  CL 105  --  106 107 106 107  CO2  25  --  21* 20* 26 26  ANIONGAP 7  --  13 10 7 6   GLUCOSE 91  --  86 80 91 119*  BUN 19  --  22 17 15 17   CREATININE 1.18*  --  1.23* 1.03* 1.10* 1.22*  AST  --   --  30 31  --   --   ALT  --   --  41 35  --   --   ALKPHOS  --   --  58 52  --   --   BILITOT  --   --  0.3 0.5  --   --   ALBUMIN  --   --  3.1* 3.0*  --   --   LATICACIDVEN  --  0.8  --   --   --   --   INR 1.0  --   --   --   --   --   MG  --   --   --   --  2.1 2.0  PHOS  --   --   --   --  3.9 3.1  CALCIUM  8.1*  --  8.5* 7.7* 8.5* 8.1*      Recent Labs  Lab 10/28/24 1830 10/29/24 1804 10/29/24 1953 10/30/24 0500 10/31/24 0701 11/03/24 0359  LATICACIDVEN  --  0.8  --   --   --   --   INR 1.0  --   --   --   --   --   MG  --   --   --   --  2.1 2.0  CALCIUM  8.1*  --  8.5* 7.7* 8.5* 8.1*    --------------------------------------------------------------------------------------------------------------- No results found for: CHOL, HDL, LDLCALC, LDLDIRECT, TRIG, CHOLHDL  Lab Results  Component Value Date   HGBA1C 5.6 08/02/2020   No results for input(s): TSH, T4TOTAL, FREET4, T3FREE, THYROIDAB in the last 72 hours. No results for input(s): VITAMINB12,  FOLATE, FERRITIN, TIBC, IRON, RETICCTPCT in the last 72 hours. ------------------------------------------------------------------------------------------------------------------ Cardiac Enzymes No results for input(s): CKMB, TROPONINI, MYOGLOBIN in the last 168 hours.  Invalid input(s): CK  Micro Results Recent Results (from the past 240 hours)  Urine Culture     Status: Abnormal   Collection Time: 10/28/24  6:30 PM   Specimen: Urine, Clean Catch  Result Value Ref Range Status   Specimen Description URINE, CLEAN CATCH  Final   Special Requests   Final    NONE Performed at [facility], [address], [place], [place] [address]    Culture MULTIPLE SPECIES PRESENT, SUGGEST RECOLLECTION (A)  Final   Report Status 10/30/2024 FINAL  Final  Blood culture (routine x 2)     Status: None   Collection Time: 10/29/24  5:14 PM   Specimen: BLOOD  Result Value Ref Range Status   Specimen Description BLOOD SITE NOT SPECIFIED  Final   Special Requests   Final    BOTTLES DRAWN AEROBIC AND ANAEROBIC Blood Culture results may not be optimal due to an inadequate volume of blood received in culture bottles   Culture   Final    NO GROWTH 5 DAYS Performed at [facility], [address], [place], [place] [address]    Report Status 11/03/2024 FINAL  Final  Blood culture (routine x 2)     Status: None   Collection Time: 10/29/24  5:14 PM   Specimen: BLOOD  Result Value Ref Range Status   Specimen Description  BLOOD SITE NOT SPECIFIED  Final   Special Requests   Final    BOTTLES DRAWN AEROBIC AND ANAEROBIC Blood Culture results may not be optimal due to an inadequate volume of blood received in culture bottles   Culture   Final    NO GROWTH 5 DAYS Performed at [facility], [address] [place], [place] [address]    Report Status 11/03/2024 FINAL  Final    Radiology Reports  No results found.     Signature  -   Brayton Lye M.D on 11/04/2024 at  11:07 AM   -  To page go to [email or web address]

## 2024-11-04 NOTE — Progress Notes (Signed)
 [facility] night cross cover note:  The patient reports muscle spasms this morning that have been refractory to her existing tizanidine  and baclofen .  She was offered prn Tylenol , but conveys that this is typically ineffective in addressing her refractory muscle spasms.  I subsequently ordered a one-time dose of Robaxin  500 mg p.o. x 1 dose now.  Of note, her magnesium  level this morning is found to be 2.0.    Mary Pore, DO Hospitalist

## 2024-11-04 NOTE — Progress Notes (Signed)
 Physical Therapy Treatment Patient Details Name: Mary Villanueva MRN: [id number] DOB: October 30, 1956 Today's Date: 11/04/2024   History of Present Illness 68 y.o. female admitted 10/29/24 with increased weakness and falls; of note, recent dx UTI. LLE imaging negative for acute injury. Workup for acute cystitis. Brain and lumbar MRI compatible with known MS, no new or enhancing lesions identified. PMH includes multiple sclerosis, spasticity, overactive bladder, CKD, essential HTN, anemia, depression.    PT Comments  Pt received in the recliner and agreeable to session. Pt able to tolerate 3 standing trials, but demonstrates quick fatigue and BLE weakness. Pt demonstrates R knee valgus and L foot supination limiting standing tolerance. Attempted weight shifting, but pt unable to tolerate. Pt able to perform BLE exercises at end of session with encouragement to complete independently for increased strength. Pt continues to benefit from PT services to progress toward functional mobility goals.     If plan is discharge home, recommend the following: A lot of help with walking and/or transfers;A lot of help with bathing/dressing/bathroom;Assistance with cooking/housework;Help with stairs or ramp for entrance;Assist for transportation   Can travel by private vehicle     No  Equipment Recommendations   (TBD next venue)    Recommendations for Other Services       Precautions / Restrictions Precautions Precautions: Fall Recall of Precautions/Restrictions: Intact Restrictions Weight Bearing Restrictions Per Provider Order: No     Mobility  Bed Mobility               General bed mobility comments: Pt in recliner at beginning and end of session    Transfers Overall transfer level: Needs assistance Equipment used: Ambulation equipment used Transfers: Sit to/from Stand Sit to Stand: Mod assist, +2 physical assistance, Via lift equipment           General transfer comment: STS from  recliner x1 and stedy pads x2 with mod A +2 for power up. Cues for upright posture and knee extension    Ambulation/Gait                   Stairs             Wheelchair Mobility     Tilt Bed    Modified Rankin (Stroke Patients Only)       Balance Overall balance assessment: Needs assistance Sitting-balance support: No upper extremity supported, Feet supported Sitting balance-Leahy Scale: Fair     Standing balance support: Bilateral upper extremity supported, During functional activity, Reliant on assistive device for balance Standing balance-Leahy Scale: Poor Standing balance comment: Dependent on stedy support to reach full upright position.                            Communication Communication Communication: No apparent difficulties  Cognition Arousal: Alert Behavior During Therapy: WFL for tasks assessed/performed   PT - Cognitive impairments: No apparent impairments                         Following commands: Intact      Cueing Cueing Techniques: Verbal cues  Exercises General Exercises - Lower Extremity Quad Sets: AROM, Both, 5 reps Long Arc Quad: AROM, Seated, Both, 10 reps Hip ABduction/ADduction: AROM, Seated, 5 reps, AAROM, Both Hip Flexion/Marching: AROM, Seated, Both, 10 reps    General Comments        Pertinent Vitals/Pain Pain Assessment Pain Assessment: Faces Faces Pain Scale: Hurts a  little bit Pain Location: R knee with terminal extension Pain Descriptors / Indicators: Aching Pain Intervention(s): Monitored during session     PT Goals (current goals can now be found in the care plan section) Acute Rehab PT Goals Patient Stated Goal: regain strength so she can return home PT Goal Formulation: With patient Time For Goal Achievement: 11/13/24 Progress towards PT goals: Progressing toward goals    Frequency    Min 2X/week       AM-PAC PT 6 Clicks Mobility   Outcome Measure  Help needed  turning from your back to your side while in a flat bed without using bedrails?: A Little Help needed moving from lying on your back to sitting on the side of a flat bed without using bedrails?: A Little Help needed moving to and from a bed to a chair (including a wheelchair)?: Total Help needed standing up from a chair using your arms (e.g., wheelchair or bedside chair)?: Total Help needed to walk in hospital room?: Total Help needed climbing 3-5 steps with a railing? : Total 6 Click Score: 10    End of Session Equipment Utilized During Treatment: Gait belt Activity Tolerance: Patient tolerated treatment well;Patient limited by fatigue Patient left: in chair;with call bell/phone within reach;with chair alarm set Nurse Communication: Mobility status;Need for lift equipment PT Visit Diagnosis: Other abnormalities of gait and mobility (R26.89);Muscle weakness (generalized) (M62.81)     Time: 1338-1400 PT Time Calculation (min) (ACUTE ONLY): 22 min  Charges:    $Therapeutic Activity: 8-22 mins PT General Charges $$ ACUTE PT VISIT: 1 Visit                    Darryle George, PTA Acute Rehabilitation Services Secure Chat Preferred  [phone]    Darryle George 11/04/2024, 2:16 PM

## 2024-11-04 NOTE — Plan of Care (Signed)

## 2024-11-05 MED ORDER — MAGNESIUM CITRATE PO SOLN
0.5000 | Freq: Once | ORAL | Status: DC
  Filled 2024-11-05 (×2): qty 296

## 2024-11-05 MED ORDER — MAGNESIUM HYDROXIDE 400 MG/5ML PO SUSP
30.0000 mL | Freq: Once | ORAL | Status: AC
  Administered 2024-11-05: 30 mL via ORAL
  Filled 2024-11-05: qty 30

## 2024-11-05 MED ORDER — BISACODYL 5 MG PO TBEC
10.0000 mg | DELAYED_RELEASE_TABLET | Freq: Once | ORAL | Status: AC
  Administered 2024-11-05: 10 mg via ORAL
  Filled 2024-11-05: qty 2

## 2024-11-05 MED ORDER — SMOG ENEMA
960.0000 mL | Freq: Once | RECTAL | Status: AC
  Administered 2024-11-05: 960 mL via RECTAL
  Filled 2024-11-05: qty 960

## 2024-11-05 NOTE — Plan of Care (Signed)

## 2024-11-05 NOTE — TOC Progression Note (Signed)
 Transition of Care [facility]) - Progression Note    Patient Details  Name: Mary Villanueva MRN: [id number] Date of Birth: 13-Mar-1956  [facility] CM/SW Contact  Landry DELENA Senters, RN Phone Number: 11/05/2024, 3:55 PM  Clinical Narrative:     CM spoke with Kia this morning from [facility] [employer]. No bed available, potentially over the weekend.   Medically stable. Continue to follow for SNF placement when bed available.   Expected Discharge Plan: Skilled Nursing Facility Barriers to Discharge: Continued Medical Work up               Expected Discharge Plan and Services       Living arrangements for the past 2 months: Single Family Home                                       Social Drivers of Health (SDOH) Interventions SDOH Screenings   Food Insecurity: No Food Insecurity (10/30/2024)  Housing: Low Risk (10/30/2024)  Transportation Needs: No Transportation Needs (10/30/2024)  Utilities: Not At Risk (10/30/2024)  Social Connections: Moderately Integrated (10/30/2024)  Tobacco Use: Medium Risk (10/29/2024)    Readmission Risk Interventions     No data to display

## 2024-11-05 NOTE — Progress Notes (Signed)
 PROGRESS NOTE    Mary Villanueva  FMW:[id number] DOB: [birth year] DOA: 10/29/2024 PCP: Larnell Hamilton, MD    Brief Narrative:   This 68 y.o. female with PMH significant for Multiple sclerosis,  wheelchair-bound, history of MS for 40 years initially stable however progressively getting worse over last 10 years, chronic weakness of the bilateral lower extremities, gait abnormality, impaired mobility, spasticity, depression, anemia, anxiety, overactive bladder, CKD stage IIIa, hypothyroidism, and essential hypertension presented to the ED with progressively worsening generalized weakness for 3 days.  Patient had a fall from the wheelchair 3 days ago and had UTI 1 week ago.  She has been taking antibiotics prescribed by PCP without any improvement.  Patient reports since the fall her mobility is more restricted and She is unable to bear weight specifically on the left lower extremity.  She has developed left ankle swelling and pain.  She was hemodynamically stable in the ED.  UA consistent with UTI.  Patient had MRI, C-spine T-spine and lumbar spine.  MRI > No new or enhancing lesions.  Similar age advanced white matter T2 hyperintensities compatible with known multiple sclerosis.  Patient was admitted for further evaluation and was continued on IV antibiotics.   Assessment & Plan:   Acute cystitis along with dehydration: Patient presented to the ED with recurrent falls, worsening bilateral lower extremity weakness. She was diagnosed with UTI and started on oral antibiotics last week. Blood culture remained negative, unfortunately no urine culture was sent on admission -Treated with IV Rocephin , finished total of 5 days,   History multiple sclerosis: History of chronic lower extremity weakness,  wheelchair-bound at baseline. Recent history of recurrent falls due to bilateral lower extremity weakness. History of gait abnormality. Wheelchair dependence Chronic muscle spasticity. She  presented with worsening bilateral extremities weakness, frequent falls while transferring  from wheelchair to bed.   Patient denies any blurry vision, headache, ataxia, limb paresthesia, chest pain, abdominal pain, shortness of breath, ophthalmoplegia, vertigo, hearing loss, sensory change hyperreflexia. In the ED extensive imaging has been completed. Chest x-ray >  No active disease process. X-ray hip  > No evidence of fracture or dislocation. X-ray of the left knee >  No evidence of fracture or dislocation. MRI brain > no acute intra cranial abnormality.  No new or enhancing lesion. MRI C-spine ,  thoracic spine and lumbar spine >  No evidence of abnormal enhancement. At this time there is no concern for MS flare. -D/OT consulted, recommendation for SNF placement  Lower extremity spasms - Potassium and magnesium  within normal limit -Continue baclofen  10 mg 3 times daily as needed, she reports worsening lower extremity spasm, so we tizanidine  was increased to 3 times daily, reports her spasm is better today (of note she received 1 dose of Robaxin  as well overnight)  Constipation - No bowel movement despite receiving magnesium  citrate yesterday, will give Dulcolax and milk of magnesia today.  History of hypothyroidism: Continue levothyroxine .  History of depression: Continue Prozac .   History of overactive bladder: Continue In-N-Out cath as needed and oxybutynin .   History of CKD stage IIIa: Stable renal function.  Avoid nephrotoxic medications.   History of essential hypertension Continue irbesartan .  Left ankle swelling: Venous Dopplers was obtained, negative for DVT  DVT prophylaxis: Lovenox  Code Status: Full code Family Communication: No family at bedside Disposition Plan: Medically stable for discharge to SNF, still pending insurance approval and bed availability     Consultants:  None  Procedures:  MRI  Antimicrobials:  Anti-infectives (  From admission, onward)     Start     Dose/Rate Route Frequency Ordered Stop   10/30/24 0600  cefTRIAXone  (ROCEPHIN ) 1 g in sodium chloride  0.9 % 100 mL IVPB  Status:  Discontinued        1 g 200 mL/hr over 30 Minutes Intravenous Every 24 hours 10/29/24 2056 11/03/24 1129   10/29/24 1730  cefTRIAXone  (ROCEPHIN ) 2 g in sodium chloride  0.9 % 100 mL IVPB        2 g 200 mL/hr over 30 Minutes Intravenous  Once 10/29/24 1728 10/29/24 1837      Subjective:   In bed, comfortable Good air entry Abdomen soft Lower extremity with no edema   Objective: Vitals:   11/04/24 2119 11/05/24 0438 11/05/24 0439 11/05/24 0800  BP: (!) 111/51  (!) 117/48 (!) 130/109  Pulse: 72  88 79  Resp:   18   Temp:  98.9 F (37.2 C)    TempSrc:  Oral    SpO2: 95%  94% 94%  Weight:      Height:        Intake/Output Summary (Last 24 hours) at 11/05/2024 1405 Last data filed at 11/04/2024 2100 Gross per 24 hour  Intake --  Output 600 ml  Net -600 ml   Filed Weights   10/29/24 1728  Weight: 65.8 kg    Examination:  Awake, alert, no apparent distress Good air entry bilaterally Regular rate and rhythm +ve B.Sounds, Abd Soft No Cyanosis, Clubbing or edema, No new Rash or bruise      Data Review:   Patient Lines/Drains/Airways Status     Active Line/Drains/Airways     Name Placement date Placement time Site Days   Peripheral IV 10/29/24 Anterior;Right Forearm 10/29/24  1807  Forearm  7   External Urinary Catheter 10/30/24  1230  --  6             Inpatient Medications  Scheduled Meds:  enoxaparin  (LOVENOX ) injection  40 mg Subcutaneous Q24H   FLUoxetine   60 mg Oral Daily   levothyroxine   50 mcg Oral Q0600   oxybutynin   10 mg Oral QHS   rosuvastatin   20 mg Oral QHS   sodium chloride  flush  3 mL Intravenous Q12H   sodium chloride  flush  3 mL Intravenous Q12H   tiZANidine   4 mg Oral TID   Continuous Infusions:   PRN Meds:.acetaminophen  **OR** acetaminophen , baclofen , ondansetron  **OR** ondansetron   (ZOFRAN ) IV, sodium chloride  flush  DVT Prophylaxis  enoxaparin  (LOVENOX ) injection 40 mg Start: 10/29/24 2200 SCDs Start: 10/29/24 2057 Place TED hose Start: 10/29/24 2057    Recent Labs  Lab 10/29/24 1807 10/30/24 0500 10/31/24 0701 11/03/24 0359  WBC 6.3 5.1 5.5 6.3  HGB 12.0 12.0 12.3 11.4*  HCT 36.3 37.5 36.2 33.5*  PLT 225 200 210 221  MCV 94.5 98.4 92.1 92.5  MCH 31.3 31.5 31.3 31.5  MCHC 33.1 32.0 34.0 34.0  RDW 12.6 12.7 12.4 12.4  LYMPHSABS 0.8  --   --  0.8  MONOABS 0.7  --   --  0.6  EOSABS 0.1  --   --  0.1  BASOSABS 0.0  --   --  0.0    Recent Labs  Lab 10/29/24 1804 10/29/24 1953 10/30/24 0500 10/31/24 0701 11/03/24 0359  NA  --  140 137 139 139  K  --  4.4 4.1 4.2 4.0  CL  --  106 107 106 107  CO2  --  21* 20*  26 26  ANIONGAP  --  13 10 7 6   GLUCOSE  --  86 80 91 119*  BUN  --  22 17 15 17   CREATININE  --  1.23* 1.03* 1.10* 1.22*  AST  --  30 31  --   --   ALT  --  41 35  --   --   ALKPHOS  --  58 52  --   --   BILITOT  --  0.3 0.5  --   --   ALBUMIN  --  3.1* 3.0*  --   --   LATICACIDVEN 0.8  --   --   --   --   MG  --   --   --  2.1 2.0  PHOS  --   --   --  3.9 3.1  CALCIUM   --  8.5* 7.7* 8.5* 8.1*      Recent Labs  Lab 10/29/24 1804 10/29/24 1953 10/30/24 0500 10/31/24 0701 11/03/24 0359  LATICACIDVEN 0.8  --   --   --   --   MG  --   --   --  2.1 2.0  CALCIUM   --  8.5* 7.7* 8.5* 8.1*    --------------------------------------------------------------------------------------------------------------- No results found for: CHOL, HDL, LDLCALC, LDLDIRECT, TRIG, CHOLHDL  Lab Results  Component Value Date   HGBA1C 5.6 08/02/2020   No results for input(s): TSH, T4TOTAL, FREET4, T3FREE, THYROIDAB in the last 72 hours. No results for input(s): VITAMINB12, FOLATE, FERRITIN, TIBC, IRON, RETICCTPCT in the last 72  hours. ------------------------------------------------------------------------------------------------------------------ Cardiac Enzymes No results for input(s): CKMB, TROPONINI, MYOGLOBIN in the last 168 hours.  Invalid input(s): CK  Micro Results Recent Results (from the past 240 hours)  Urine Culture     Status: Abnormal   Collection Time: 10/28/24  6:30 PM   Specimen: Urine, Clean Catch  Result Value Ref Range Status   Specimen Description URINE, CLEAN CATCH  Final   Special Requests   Final    NONE Performed at [facility], [address], [place], [place] [address]    Culture MULTIPLE SPECIES PRESENT, SUGGEST RECOLLECTION (A)  Final   Report Status 10/30/2024 FINAL  Final  Blood culture (routine x 2)     Status: None   Collection Time: 10/29/24  5:14 PM   Specimen: BLOOD  Result Value Ref Range Status   Specimen Description BLOOD SITE NOT SPECIFIED  Final   Special Requests   Final    BOTTLES DRAWN AEROBIC AND ANAEROBIC Blood Culture results may not be optimal due to an inadequate volume of blood received in culture bottles   Culture   Final    NO GROWTH 5 DAYS Performed at [facility], [address], [place], [place] [address]    Report Status 11/03/2024 FINAL  Final  Blood culture (routine x 2)     Status: None   Collection Time: 10/29/24  5:14 PM   Specimen: BLOOD  Result Value Ref Range Status   Specimen Description BLOOD SITE NOT SPECIFIED  Final   Special Requests   Final    BOTTLES DRAWN AEROBIC AND ANAEROBIC Blood Culture results may not be optimal due to an inadequate volume of blood received in culture bottles   Culture   Final    NO GROWTH 5 DAYS Performed at [facility], [address] [place], [place] [address]    Report Status 11/03/2024 FINAL  Final    Radiology Reports  No results found.     Signature  -   Brayton Lye M.D on 11/05/2024 at 2:05 PM   -  To page go to [email or web address]

## 2024-11-06 ENCOUNTER — Observation Stay (HOSPITAL_COMMUNITY)

## 2024-11-06 MED ORDER — MAGNESIUM CITRATE PO SOLN
1.0000 | Freq: Once | ORAL | Status: AC
  Administered 2024-11-06: 1 via ORAL
  Filled 2024-11-06: qty 296

## 2024-11-06 MED ORDER — BISACODYL 10 MG RE SUPP
10.0000 mg | Freq: Once | RECTAL | Status: AC
  Administered 2024-11-06: 10 mg via RECTAL
  Filled 2024-11-06: qty 1

## 2024-11-06 NOTE — TOC Progression Note (Addendum)
 Transition of Care [facility]) - Progression Note    Patient Details  Name: Mary Villanueva MRN: [id number] Date of Birth: 09-16-1956  [facility] CM/SW Contact  Bridget Cordella Simmonds, LCSW Phone Number: 11/06/2024, 10:52 AM  Clinical Narrative:   Per [facility]: no bed available today.  1555: Message [facility]: may have bed tomorrow.  Existing auth in [place]: [id number], 5 days: 12/11-12/15.  Expected Discharge Plan: Skilled Nursing Facility Barriers to Discharge: Continued Medical Work up               Expected Discharge Plan and Services       Living arrangements for the past 2 months: Single Family Home                                       Social Drivers of Health (SDOH) Interventions SDOH Screenings   Food Insecurity: No Food Insecurity (10/30/2024)  Housing: Low Risk (10/30/2024)  Transportation Needs: No Transportation Needs (10/30/2024)  Utilities: Not At Risk (10/30/2024)  Social Connections: Moderately Integrated (10/30/2024)  Tobacco Use: Medium Risk (10/29/2024)    Readmission Risk Interventions     No data to display

## 2024-11-06 NOTE — Progress Notes (Signed)
 PROGRESS NOTE    Mary Villanueva  FMW:[id number] DOB: 09-Dec-1955 DOA: 10/29/2024 PCP: Mary Hamilton, MD    Brief Narrative:   This 68 y.o. female with PMH significant for Multiple sclerosis,  wheelchair-bound, history of MS for 40 years initially stable however progressively getting worse over last 10 years, chronic weakness of the bilateral lower extremities, gait abnormality, impaired mobility, spasticity, depression, anemia, anxiety, overactive bladder, CKD stage IIIa, hypothyroidism, and essential hypertension presented to the ED with progressively worsening generalized weakness for 3 days.  Patient had a fall from the wheelchair 3 days ago and had UTI 1 week ago.  She has been taking antibiotics prescribed by PCP without any improvement.  Patient reports since the fall her mobility is more restricted and She is unable to bear weight specifically on the left lower extremity.  She has developed left ankle swelling and pain.  She was hemodynamically stable in the ED.  UA consistent with UTI.  Patient had MRI, C-spine T-spine and lumbar spine.  MRI > No new or enhancing lesions.  Similar age advanced white matter T2 hyperintensities compatible with known multiple sclerosis.  Patient was admitted for further evaluation and was continued on IV antibiotics.   Assessment & Plan:   Acute cystitis along with dehydration: Patient presented to the ED with recurrent falls, worsening bilateral lower extremity weakness. She was diagnosed with UTI and started on oral antibiotics last week. Blood culture remained negative, unfortunately no urine culture was sent on admission -Treated with IV Rocephin , finished total of 5 days,   History multiple sclerosis History of chronic lower extremity weakness,  wheelchair-bound at baseline. Recent history of recurrent falls due to bilateral lower extremity weakness. History of gait abnormality. Wheelchair dependence Chronic muscle spasticity. She presented  with worsening bilateral extremities weakness, frequent falls while transferring  from wheelchair to bed.   Patient denies any blurry vision, headache, ataxia, limb paresthesia, chest pain, abdominal pain, shortness of breath, ophthalmoplegia, vertigo, hearing loss, sensory change hyperreflexia. In the ED extensive imaging has been completed. Chest x-ray >  No active disease process. X-ray hip  > No evidence of fracture or dislocation. X-ray of the left knee >  No evidence of fracture or dislocation. MRI brain > no acute intra cranial abnormality.  No new or enhancing lesion. MRI C-spine ,  thoracic spine and lumbar spine >  No evidence of abnormal enhancement. At this time there is no concern for MS flare. -PT/OT consulted, recommendation for SNF placement  Lower extremity spasms - Potassium and magnesium  within normal limit -Continue baclofen  10 mg 3 times daily as needed. - Increased her tizanidine  to 3 times daily, reports spasms much improved .  Constipation - Reports small bowel movements yesterday after smog enema, nausea, no vomiting, no abdominal pain or distention, KUB today with no significant stool burden.  Mary Villanueva  History of hypothyroidism: Continue levothyroxine .  History of depression: Continue Prozac .   History of overactive bladder: Continue In-N-Out cath as needed and oxybutynin .   History of CKD stage IIIa: Stable renal function.  Avoid nephrotoxic medications.   History of essential hypertension Continue irbesartan .  Left ankle swelling: Venous Dopplers was obtained, negative for DVT  DVT prophylaxis: Lovenox  Code Status: Full code Family Communication: No family at bedside Disposition Plan: Medically stable for discharge to SNF, still pending insurance approval and bed availability     Consultants:  None  Procedures:  MRI  Antimicrobials:  Anti-infectives (From admission, onward)    Start  Dose/Rate Route Frequency Ordered Stop   10/30/24 0600   cefTRIAXone  (ROCEPHIN ) 1 g in sodium chloride  0.9 % 100 mL IVPB  Status:  Discontinued        1 g 200 mL/hr over 30 Minutes Intravenous Every 24 hours 10/29/24 2056 11/03/24 1129   10/29/24 1730  cefTRIAXone  (ROCEPHIN ) 2 g in sodium chloride  0.9 % 100 mL IVPB        2 g 200 mL/hr over 30 Minutes Intravenous  Once 10/29/24 1728 10/29/24 1837      Subjective:   Wake, alert, x 3, no apparent distress  Good air entry  Abdomen with good bowel sounds, no distention, soft, no tenderness Lower extremities with no edema   Objective: Vitals:   11/06/24 0000 11/06/24 0400 11/06/24 0732 11/06/24 0814  BP: (!) 100/39 121/60  (!) 128/49  Pulse: 76 83 81   Resp: 18 15 14    Temp:  98.5 F (36.9 C)  97.8 F (36.6 C)  TempSrc:  Oral  Oral  SpO2: 96% 94% 95%   Weight:      Height:        Intake/Output Summary (Last 24 hours) at 11/06/2024 1100 Last data filed at 11/06/2024 0400 Gross per 24 hour  Intake --  Output 1800 ml  Net -1800 ml   Filed Weights   10/29/24 1728  Weight: 65.8 kg    Examination:  Awake, alert, no apparent distress Good air entry bilaterally Regular rate and rhythm +ve B.Sounds, Abd Soft No Cyanosis, Clubbing or edema, No new Rash or bruise      Data Review:   Patient Lines/Drains/Airways Status     Active Line/Drains/Airways     Name Placement date Placement time Site Days   Peripheral IV 10/29/24 Anterior;Right Forearm 10/29/24  1807  Forearm  8   External Urinary Catheter 10/30/24  1230  --  7             Inpatient Medications  Scheduled Meds:  enoxaparin  (LOVENOX ) injection  40 mg Subcutaneous Q24H   FLUoxetine   60 mg Oral Daily   levothyroxine   50 mcg Oral Q0600   magnesium  citrate  0.5 Bottle Oral Once   oxybutynin   10 mg Oral QHS   rosuvastatin   20 mg Oral QHS   sodium chloride  flush  3 mL Intravenous Q12H   sodium chloride  flush  3 mL Intravenous Q12H   tiZANidine   4 mg Oral TID   Continuous Infusions:   PRN  Meds:.acetaminophen  **OR** acetaminophen , baclofen , ondansetron  **OR** ondansetron  (ZOFRAN ) IV, sodium chloride  flush  DVT Prophylaxis  enoxaparin  (LOVENOX ) injection 40 mg Start: 10/29/24 2200 SCDs Start: 10/29/24 2057 Place TED hose Start: 10/29/24 2057    Recent Labs  Lab 10/31/24 0701 11/03/24 0359  WBC 5.5 6.3  HGB 12.3 11.4*  HCT 36.2 33.5*  PLT 210 221  MCV 92.1 92.5  MCH 31.3 31.5  MCHC 34.0 34.0  RDW 12.4 12.4  LYMPHSABS  --  0.8  MONOABS  --  0.6  EOSABS  --  0.1  BASOSABS  --  0.0    Recent Labs  Lab 10/31/24 0701 11/03/24 0359  NA 139 139  K 4.2 4.0  CL 106 107  CO2 26 26  ANIONGAP 7 6  GLUCOSE 91 119*  BUN 15 17  CREATININE 1.10* 1.22*  MG 2.1 2.0  PHOS 3.9 3.1  CALCIUM  8.5* 8.1*      Recent Labs  Lab 10/31/24 0701 11/03/24 0359  MG 2.1 2.0  CALCIUM  8.5* 8.1*    --------------------------------------------------------------------------------------------------------------- No results found for: CHOL, HDL, LDLCALC, LDLDIRECT, TRIG, CHOLHDL  Lab Results  Component Value Date   HGBA1C 5.6 08/02/2020   No results for input(s): TSH, T4TOTAL, FREET4, T3FREE, THYROIDAB in the last 72 hours. No results for input(s): VITAMINB12, FOLATE, FERRITIN, TIBC, IRON, RETICCTPCT in the last 72 hours. ------------------------------------------------------------------------------------------------------------------ Cardiac Enzymes No results for input(s): CKMB, TROPONINI, MYOGLOBIN in the last 168 hours.  Invalid input(s): CK  Micro Results Recent Results (from the past 240 hours)  Urine Culture     Status: Abnormal   Collection Time: 10/28/24  6:30 PM   Specimen: Urine, Clean Catch  Result Value Ref Range Status   Specimen Description URINE, CLEAN CATCH  Final   Special Requests   Final    NONE Performed at [facility], [address] [place], [place] [address]    Culture MULTIPLE SPECIES  PRESENT, SUGGEST RECOLLECTION (A)  Final   Report Status 10/30/2024 FINAL  Final  Blood culture (routine x 2)     Status: None   Collection Time: 10/29/24  5:14 PM   Specimen: BLOOD  Result Value Ref Range Status   Specimen Description BLOOD SITE NOT SPECIFIED  Final   Special Requests   Final    BOTTLES DRAWN AEROBIC AND ANAEROBIC Blood Culture results may not be optimal due to an inadequate volume of blood received in culture bottles   Culture   Final    NO GROWTH 5 DAYS Performed at [facility], [address] [place], [place] [address]    Report Status 11/03/2024 FINAL  Final  Blood culture (routine x 2)     Status: None   Collection Time: 10/29/24  5:14 PM   Specimen: BLOOD  Result Value Ref Range Status   Specimen Description BLOOD SITE NOT SPECIFIED  Final   Special Requests   Final    BOTTLES DRAWN AEROBIC AND ANAEROBIC Blood Culture results may not be optimal due to an inadequate volume of blood received in culture bottles   Culture   Final    NO GROWTH 5 DAYS Performed at [facility], [address] [place], [place] [address]    Report Status 11/03/2024 FINAL  Final    Radiology Reports  DG Abd Portable 1V Result Date: 11/06/2024 CLINICAL DATA:  Constipation. EXAM: PORTABLE ABDOMEN - 1 VIEW COMPARISON:  06/17/2018 FINDINGS: Asymmetric elevation right hemidiaphragm. Diffuse distention of small bowel and colon is seen through the abdomen and pelvis. No rectal gas evident. No substantial stool volume by x-ray. IMPRESSION: Diffuse gaseous distention of small bowel and colon through the abdomen and pelvis. No substantial stool volume by x-ray. Findings likely reflect underlying ileus. CT imaging could be used to further evaluate as clinically warranted. Electronically Signed   By: Camellia Candle M.D.   On: 11/06/2024 10:07       Signature  -   Brayton Agapita Savarino M.D on 11/06/2024 at 11:00 AM   -  To page go to [email or web address]

## 2024-11-07 DIAGNOSIS — N3000 Acute cystitis without hematuria: Secondary | ICD-10-CM | POA: Diagnosis not present

## 2024-11-07 MED ORDER — BACLOFEN 10 MG PO TABS: 20.0000 mg | ORAL_TABLET | Freq: Three times a day (TID) | ORAL | Status: DC

## 2024-11-07 NOTE — Discharge Summary (Signed)
 Physician Discharge Summary  Mary Villanueva FMW:[id number] DOB: [birth year] DOA: 10/29/2024  PCP: Larnell Hamilton, MD  Admit date: 10/29/2024 Discharge date: 11/07/2024  Admitted From: (Home) Disposition:  (SNF)  Recommendations for Outpatient Follow-up:  Follow up with PCP in 1-2 weeks Please obtain BMP/CBC in one week Please follow up on the following pending results:   Diet recommendation: Heart Healthy   Brief/Interim Summary:  This 68 y.o. female with PMH significant for Multiple sclerosis,  wheelchair-bound, history of MS for 40 years initially stable however progressively getting worse over last 10 years, chronic weakness of the bilateral lower extremities, gait abnormality, impaired mobility, spasticity, depression, anemia, anxiety, overactive bladder, CKD stage IIIa, hypothyroidism, and essential hypertension presented to the ED with progressively worsening generalized weakness for 3 days.  Patient had a fall from the wheelchair 3 days ago and had UTI 1 week ago.  She has been taking antibiotics prescribed by PCP without any improvement.  Patient reports since the fall her mobility is more restricted and She is unable to bear weight specifically on the left lower extremity.  She has developed left ankle swelling and pain.  She was hemodynamically stable in the ED.  UA consistent with UTI.  Patient had MRI, C-spine T-spine and lumbar spine.  MRI > No new or enhancing lesions.  Similar age advanced white matter T2 hyperintensities compatible with known multiple sclerosis.  Patient was admitted for further evaluation and was continued on IV antibiotics.  Acute cystitis along with dehydration: Patient presented to the ED with recurrent falls, worsening bilateral lower extremity weakness. She was diagnosed with UTI and started on oral antibiotics last week. Blood culture remained negative, unfortunately no urine culture was sent on admission -Treated with IV Rocephin , finished total of  5 days, - She appears to be on antibiotics suppressive therapy trimethoprim, continued on discharge     History multiple sclerosis History of chronic lower extremity weakness,  wheelchair-bound at baseline. Recent history of recurrent falls due to bilateral lower extremity weakness. History of gait abnormality. Wheelchair dependence Chronic muscle spasticity. She presented with worsening bilateral extremities weakness, frequent falls while transferring  from wheelchair to bed.   Patient denies any blurry vision, headache, ataxia, limb paresthesia, chest pain, abdominal pain, shortness of breath, ophthalmoplegia, vertigo, hearing loss, sensory change hyperreflexia. In the ED extensive imaging has been completed. Chest x-ray >  No active disease process. X-ray hip  > No evidence of fracture or dislocation. X-ray of the left knee >  No evidence of fracture or dislocation. MRI brain > no acute intra cranial abnormality.  No new or enhancing lesion. MRI C-spine ,  thoracic spine and lumbar spine >  No evidence of abnormal enhancement. At this time there is no concern for MS flare. -PT/OT consulted, recommendation for SNF placement   Lower extremity spasms - Potassium and magnesium  within normal limit -Continue baclofen  10 mg 3 times daily scheduled, and tizanidine  at bedtime   Constipation - Resolved with bowel regimen   History of hypothyroidism: Continue levothyroxine .   History of depression: Continue home medications   History of overactive bladder: Continue In-N-Out cath as needed and oxybutynin .   History of CKD stage IIIa: Stable renal function.  Avoid nephrotoxic medications.   History of essential hypertension On irbesartan  at home, but blood pressure overall has been acceptable during hospital stay without any medications, so it has been discontinued on discharge   Left ankle swelling: Venous Dopplers was obtained, negative for DVT    Discharge  Diagnoses:   Principal Problem:   Acute cystitis Active Problems:   Lower extremity weakness   History of multiple sclerosis (HCC)   Essential hypertension   Gait abnormality   Hypothyroidism   Wheelchair dependence   History of depression   Overactive bladder   CKD (chronic kidney disease) stage 3, GFR 30-59 ml/min [facility])    Discharge Instructions  Discharge Instructions     Increase activity slowly   Complete by: As directed       Allergies as of 11/07/2024   No Known Allergies      Medication List     STOP taking these medications    buPROPion  150 MG 12 hr tablet Commonly known as: WELLBUTRIN  SR   cephALEXin  500 MG capsule Commonly known as: KEFLEX    estradiol 0.01 % Crea vaginal cream Commonly known as: ESTRACE   valsartan 320 MG tablet Commonly known as: DIOVAN       TAKE these medications    alendronate 70 MG tablet Commonly known as: FOSAMAX Take 70 mg by mouth once a week.   baclofen  10 MG tablet Commonly known as: LIORESAL  Take 1 tablet (10 mg total) by mouth 3 (three) times daily.   Biotin  10 MG Caps Take 10 mg by mouth every morning.   CENTRUM SILVER 50+WOMEN PO Take 1 capsule by mouth daily.   Fish Oil 1200 MG Caps Take 1,200 mg by mouth daily.   FLUoxetine  40 MG capsule Commonly known as: PROZAC  TAKE 1 CAPSULE BY MOUTH 1  TIME DAILY WITH 20 MG  CAPSULE What changed: See the new instructions.   levothyroxine  50 MCG tablet Commonly known as: SYNTHROID  Take 50 mcg by mouth daily.   oxybutynin  10 MG 24 hr tablet Commonly known as: DITROPAN -XL Take 10 mg by mouth at bedtime.   PROBIOTIC DAILY PO Take 1 capsule by mouth daily.   rosuvastatin  20 MG tablet Commonly known as: CRESTOR  Take 20 mg by mouth at bedtime.   tiZANidine  4 MG tablet Commonly known as: ZANAFLEX  Take 1 tablet (4 mg total) by mouth at bedtime.   trimethoprim 100 MG tablet Commonly known as: TRIMPEX Take 100 mg by mouth daily.   vitamin B-12 500 MCG  tablet Commonly known as: CYANOCOBALAMIN  Take 500 mcg by mouth daily.   Vitamin D -3 25 MCG (1000 UT) Caps Take 1,000 Units by mouth 2 (two) times daily.        Contact information for after-discharge care     Destination     [facility] .   Service: Skilled Nursing Contact information: [address] [place]  [address] [phone]                    Allergies[1]  Consultations: None   Procedures/Studies: DG Abd Portable 1V Result Date: 11/06/2024 CLINICAL DATA:  Constipation. EXAM: PORTABLE ABDOMEN - 1 VIEW COMPARISON:  06/17/2018 FINDINGS: Asymmetric elevation right hemidiaphragm. Diffuse distention of small bowel and colon is seen through the abdomen and pelvis. No rectal gas evident. No substantial stool volume by x-ray. IMPRESSION: Diffuse gaseous distention of small bowel and colon through the abdomen and pelvis. No substantial stool volume by x-ray. Findings likely reflect underlying ileus. CT imaging could be used to further evaluate as clinically warranted. Electronically Signed   By: Camellia Candle M.D.   On: 11/06/2024 10:07   VAS US  LOWER EXTREMITY VENOUS (DVT) (ONLY [facility] & [facility]) Result Date: 10/30/2024  Lower Venous DVT Study Patient Name:  Mary Villanueva  Date of Exam:   10/30/2024 Medical Rec #: [id number]            Accession #:    [id number] Date of Birth: 07/24/1956            Patient Gender: F Patient Age:   8 years Exam Location:  [facility] Procedure:      VAS US  LOWER EXTREMITY VENOUS (DVT) Referring Phys: MICAELA SUNDIL --------------------------------------------------------------------------------  Indications: Pain, and weakness, recent fall.  Limitations: Unable to properly position. Comparison Study: Previous study of the bilateral lower extremities on                   1.14.2025. Performing Technologist: Edilia Elden Appl  Examination Guidelines: A complete evaluation includes B-mode imaging, spectral Doppler,  color Doppler, and power Doppler as needed of all accessible portions of each vessel. Bilateral testing is considered an integral part of a complete examination. Limited examinations for reoccurring indications may be performed as noted. The reflux portion of the exam is performed with the patient in reverse Trendelenburg.  +-----+---------------+---------+-----------+----------+--------------+ RIGHTCompressibilityPhasicitySpontaneityPropertiesThrombus Aging +-----+---------------+---------+-----------+----------+--------------+ CFV  Full           Yes      Yes                                 +-----+---------------+---------+-----------+----------+--------------+ SFJ  Full           Yes      Yes                                 +-----+---------------+---------+-----------+----------+--------------+   +---------+---------------+---------+-----------+----------+-------------------+ LEFT     CompressibilityPhasicitySpontaneityPropertiesThrombus Aging      +---------+---------------+---------+-----------+----------+-------------------+ CFV      Full           Yes      Yes                                      +---------+---------------+---------+-----------+----------+-------------------+ SFJ      Full           Yes      Yes                                      +---------+---------------+---------+-----------+----------+-------------------+ FV Prox  Full                                                             +---------+---------------+---------+-----------+----------+-------------------+ FV Mid   Full                                                             +---------+---------------+---------+-----------+----------+-------------------+ FV DistalFull                                                             +---------+---------------+---------+-----------+----------+-------------------+  PFV      Full                                                              +---------+---------------+---------+-----------+----------+-------------------+ POP      Full           Yes      Yes                                      +---------+---------------+---------+-----------+----------+-------------------+ PTV      Full                                                             +---------+---------------+---------+-----------+----------+-------------------+ PERO                                                  Not well visualized +---------+---------------+---------+-----------+----------+-------------------+    Summary: RIGHT: - No evidence of common femoral vein obstruction.   LEFT: - There is no evidence of deep vein thrombosis in the lower extremity. However, portions of this examination were limited- see technologist comments above.  - No cystic structure found in the popliteal fossa.  *See table(s) above for measurements and observations. Electronically signed by Debby Robertson on 10/30/2024 at 4:36:32 PM.    Final    MR Cervical Spine W and Wo Contrast Result Date: 10/29/2024 EXAM: MRI CERVICAL SPINE WITH AND WITHOUT CONTRAST 10/29/2024 09:36:00 PM TECHNIQUE: Multiplanar multisequence MRI of the cervical spine was performed without and with the administration of intravenous contrast. COMPARISON: MRI cervical spine 05/16/2014 CLINICAL HISTORY: Multiple sclerosis (MS) FINDINGS: Motion limited study. Within this limitation: BONES AND ALIGNMENT: Grade 1 anterolisthesis of C4 on C5. Normal vertebral body heights. Marrow signal is unremarkable. No abnormal enhancement. SPINAL CORD: No substantial change in multiple short segment T2 hyperintensities in the cervical cord. No convincing new lesions or enhancing lesions. SOFT TISSUES: Approximately 2.1 cm left thyroid nodule. C2-C3: No significant disc herniation. No spinal canal stenosis or neural foraminal narrowing. C3-C4: Disc osteophyte complex with ligamentum flavum thickening. Resulting  progressive moderate to severe canal stenosis. Left greater than right facet and uncovertebral hypertrophy. Resulting progressive moderate to severe left and moderate right foraminal stenosis. C4-C5: Small posterior disc osteophyte complex with bilateral facet and uncovertebral hypertrophy. Resulting moderate to severe left and moderate right foraminal stenosis. Patent canal. C5-C6: Left greater than right facet and uncovertebral hypertrophy. Moderate left foraminal stenosis. Patent canal and right foramen. C6-C7: Left greater than right facet and uncovertebral hypertrophy. Mild left foraminal stenosis. Patent canal and right foramen. C7-T1: No significant disc herniation. No spinal canal stenosis or neural foraminal narrowing. IMPRESSION: 1. No substantial change in multiple short segment T2 hyperintensities in the cervical cord. No convincing new or enhancing lesions. 2. At C3-C4, progressive moderate to severe canal and left foraminal stenosis. Moderate right foraminal stenosis. 3. At C4-C5, moderate to severe left  and moderate right foraminal stenosis. 4. At C5-C6, moderate left foraminal stenosis. 5. Approximately 2.1 cm left thyroid nodule. Recommend thyroid ultrasound. Electronically signed by: Gilmore Molt MD 10/29/2024 10:22 PM EST RP Workstation: [id number]   MR Lumbar Spine W Wo Contrast Result Date: 10/29/2024 EXAM: MRI LUMBAR SPINE 10/29/2024 09:37:00 PM TECHNIQUE: Multiplanar multisequence MRI of the lumbar spine was performed without or with the administration of intravenous contrast. COMPARISON: None available. CLINICAL HISTORY: Multiple sclerosis (MS) FINDINGS: Motion limited study. BONES AND ALIGNMENT: Normal alignment. Normal vertebral body heights. Bone marrow signal is unremarkable. SPINAL CORD: The conus terminates normally. Normal appearance of the conus. No abnormal enhancement. SOFT TISSUES: No paraspinal mass. L1-L2: No significant disc herniation. No spinal canal stenosis or neural  foraminal narrowing. L2-L3: Mild disc bulge with prominent dorsal epidural fat. Bilateral facet arthropathy. Resulting mild canal stenosis. Patent foramina. L3-L4: Small bilateral foraminal disc protrusions. Prominent dorsal epidural fat. Bilateral facet arthropathy. No significant canal or foraminal stenosis. L4-L5: Mild disc bulging. Bilateral facet arthropathy. No significant canal or foraminal stenosis. L5-S1: Mild disc bulging. Bilateral facet arthropathy. No significant canal or foraminal stenosis. IMPRESSION: 1. Normal appearance of the conus. No abnormal enhancement. 2. Mild canal stenosis at L2-L3. Electronically signed by: Gilmore Molt MD 10/29/2024 10:07 PM EST RP Workstation: [id number]   MR THORACIC SPINE W WO CONTRAST Result Date: 10/29/2024 EXAM: MRI THORACIC SPINE WITH AND WITHOUT INTRAVENOUS CONTRAST 10/29/2024 09:37:00 PM TECHNIQUE: Multiplanar multisequence MRI of the thoracic spine was performed with and without the administration of intravenous contrast. COMPARISON: None available. CLINICAL HISTORY: Multiple sclerosis (MS) Multiple sclerosis (MS) FINDINGS: BONES AND ALIGNMENT: Exaggerated kyphosis. No substantial sagittal subluxation. Normal vertebral body heights. Bone marrow signal is unremarkable. No abnormal enhancement. SPINAL CORD: Short segment T2 hyperintense lesion at the cervicothoracic junction, compatible with prior demyelination. Suspect a few other more subtle lesions in the thoracic cord, including on the left at T4-T5 and centrally at T8-T9 .motion limits assessment of the cord. No abnormal enhancement. SOFT TISSUES: Approximately 2.1 cm left thyroid nodule. DEGENERATIVE CHANGES: No significant canal stenosis. At T9-T10 , moderate to severe right and mild left foraminal stenosis. At T10-T11, moderate right foraminal stenosis tune At T7-T8 and T8-T9, mild to moderate bilateral foraminal stenosis. IMPRESSION: 1. Multiple short-segment T2 hyperintensities in the cord  compatible with chronic demyelination. No enhancement to suggest active demyelination. No priors of the thoracic spine. 2. At T9-T10, moderate to severe right foraminal stenosis. 3. At T10-T11, moderate right foraminal stenosis. 4. No significant canal stenosis. 5. Approximately 2.1 cm left thyroid nodule. Recommend thyroid ultrasound. Electronically signed by: Gilmore Molt MD 10/29/2024 10:02 PM EST RP Workstation: [id number]   MR Brain W and Wo Contrast Result Date: 10/29/2024 EXAM: MRI BRAIN WITH AND WITHOUT CONTRAST 10/29/2024 09:36:00 PM TECHNIQUE: Multiplanar multisequence MRI of the head/brain was performed with and without the administration of intravenous contrast. COMPARISON: MRI head 11/04/2018 CLINICAL HISTORY: Multiple sclerosis (MS) FINDINGS: BRAIN AND VENTRICLES: No acute infarct. No acute intracranial hemorrhage. No mass effect or midline shift. No hydrocephalus. No substantial change in age-advanced T2 hyperintensities in the white matter, many of which are oriented perpendicular to the lateral ventricles. No new or enhancing lesions identified. Normal flow voids. No mass or abnormal enhancement. ORBITS: No acute abnormality. SINUSES: No acute abnormality. BONES AND SOFT TISSUES: No acute soft tissue abnormality. IMPRESSION: 1. Similar age-advanced white matter T2 hyperintensities, compatible with known multiple sclerosis. No new or enhancing lesions identified. 2. No acute intracranial abnormality. Electronically signed by: Gilmore  Joshua MD 10/29/2024 09:47 PM EST RP Workstation: [id number]   DG Knee Complete 4 Views Left Result Date: 10/28/2024 EXAM: 4 VIEW(S) XRAY OF THE LEFT KNEE 10/28/2024 07:20:00 PM COMPARISON: None available. CLINICAL HISTORY: fall injury FINDINGS: BONES AND JOINTS: No acute fracture. No malalignment. No significant joint effusion. Mild degenerative changes of the medial compartment with osteophyte formation. SOFT TISSUES: The soft tissues are unremarkable.  IMPRESSION: 1. No acute fracture or dislocation. Electronically signed by: Norman Gatlin MD 10/28/2024 07:49 PM EST RP Workstation: [id number]   DG Ankle Complete Left Result Date: 10/28/2024 EXAM: 3 OR MORE VIEW(S) XRAY OF THE LEFT ANKLE 10/28/2024 07:20:00 PM CLINICAL HISTORY: fall injury COMPARISON: None available. FINDINGS: BONES AND JOINTS: No acute fracture. No dislocation. Cavovarus deformity of the left foot. Hammertoes. SOFT TISSUES: Subcutaneous soft tissue edema. IMPRESSION: 1. No acute fracture or dislocation. Electronically signed by: Norman Gatlin MD 10/28/2024 07:49 PM EST RP Workstation: [id number]   DG Hip Unilat With Pelvis 2-3 Views Left Result Date: 10/28/2024 EXAM: 2 OR MORE VIEW(S) XRAY OF THE HIP 10/28/2024 07:20:00 PM COMPARISON: None available. CLINICAL HISTORY: fall injury FINDINGS: BONES AND JOINTS: No acute fracture. No malalignment. SOFT TISSUES: Vascular calcification. IMPRESSION: 1. No acute fracture or dislocation. Electronically signed by: Norman Gatlin MD 10/28/2024 07:46 PM EST RP Workstation: [id number]   DG Chest 1 View Result Date: 10/28/2024 EXAM: 1 VIEW(S) XRAY OF THE CHEST 10/28/2024 07:20:00 PM COMPARISON: None available. CLINICAL HISTORY: fall injury FINDINGS: LUNGS AND PLEURA: Elevated right hemidiaphragm. Right basilar atelectasis. Otherwise no focal pulmonary opacity. No pleural effusion. No pneumothorax. HEART AND MEDIASTINUM: Aortic arch atherosclerosis. No acute abnormality of the cardiac silhouette. BONES AND SOFT TISSUES: No acute osseous abnormality. IMPRESSION: 1. No acute process. Electronically signed by: Norman Gatlin MD 10/28/2024 07:45 PM EST RP Workstation: [id number]      Subjective:  She had BM this morning, otherwise denies any complaints Discharge Exam: Vitals:   11/07/24 0816 11/07/24 1157  BP:    Pulse:    Resp:    Temp: 98.3 F (36.8 C) 98.4 F (36.9 C)  SpO2:     Vitals:   11/06/24 2337 11/07/24 0516 11/07/24 0816  11/07/24 1157  BP: (!) 112/46 126/69    Pulse: 75 74    Resp: 16 15    Temp: 99.1 F (37.3 C) 98.7 F (37.1 C) 98.3 F (36.8 C) 98.4 F (36.9 C)  TempSrc: Oral Oral Oral Oral  SpO2: 95% 93%    Weight:      Height:        General: Pt is alert, awake, not in acute distress Cardiovascular: RRR Respiratory: CTA bilaterally Abdominal: Soft Extremities: no edema, no cyanosis    The results of significant diagnostics from this hospitalization (including imaging, microbiology, ancillary and laboratory) are listed below for reference.     Microbiology: Recent Results (from the past 240 hours)  Urine Culture     Status: Abnormal   Collection Time: 10/28/24  6:30 PM   Specimen: Urine, Clean Catch  Result Value Ref Range Status   Specimen Description URINE, CLEAN CATCH  Final   Special Requests   Final    NONE Performed at [facility], [address] [place], [place] [address]    Culture MULTIPLE SPECIES PRESENT, SUGGEST RECOLLECTION (A)  Final   Report Status 10/30/2024 FINAL  Final  Blood culture (routine x 2)     Status: None   Collection Time: 10/29/24  5:14 PM   Specimen: BLOOD  Result Value Ref Range Status   Specimen Description BLOOD SITE NOT SPECIFIED  Final   Special Requests   Final    BOTTLES DRAWN AEROBIC AND ANAEROBIC Blood Culture results may not be optimal due to an inadequate volume of blood received in culture bottles   Culture   Final    NO GROWTH 5 DAYS Performed at [facility], [address] [place], [place] [address]    Report Status 11/03/2024 FINAL  Final  Blood culture (routine x 2)     Status: None   Collection Time: 10/29/24  5:14 PM   Specimen: BLOOD  Result Value Ref Range Status   Specimen Description BLOOD SITE NOT SPECIFIED  Final   Special Requests   Final    BOTTLES DRAWN AEROBIC AND ANAEROBIC Blood Culture results may not be optimal due to an inadequate volume of blood received in culture bottles   Culture   Final     NO GROWTH 5 DAYS Performed at [facility], [address], [place], [place] [address]    Report Status 11/03/2024 FINAL  Final     Labs: BNP (last 3 results) No results for input(s): BNP in the last 8760 hours. Basic Metabolic Panel: Recent Labs  Lab 11/03/24 0359  NA 139  K 4.0  CL 107  CO2 26  GLUCOSE 119*  BUN 17  CREATININE 1.22*  CALCIUM  8.1*  MG 2.0  PHOS 3.1   Liver Function Tests: No results for input(s): AST, ALT, ALKPHOS, BILITOT, PROT, ALBUMIN in the last 168 hours. No results for input(s): LIPASE, AMYLASE in the last 168 hours. No results for input(s): AMMONIA in the last 168 hours. CBC: Recent Labs  Lab 11/03/24 0359  WBC 6.3  NEUTROABS 4.8  HGB 11.4*  HCT 33.5*  MCV 92.5  PLT 221   Cardiac Enzymes: No results for input(s): CKTOTAL, CKMB, CKMBINDEX, TROPONINI in the last 168 hours. BNP: Invalid input(s): POCBNP CBG: No results for input(s): GLUCAP in the last 168 hours. D-Dimer No results for input(s): DDIMER in the last 72 hours. Hgb A1c No results for input(s): HGBA1C in the last 72 hours. Lipid Profile No results for input(s): CHOL, HDL, LDLCALC, TRIG, CHOLHDL, LDLDIRECT in the last 72 hours. Thyroid function studies No results for input(s): TSH, T4TOTAL, T3FREE, THYROIDAB in the last 72 hours.  Invalid input(s): FREET3 Anemia work up No results for input(s): VITAMINB12, FOLATE, FERRITIN, TIBC, IRON, RETICCTPCT in the last 72 hours. Urinalysis    Component Value Date/Time   COLORURINE YELLOW 10/29/2024 1728   APPEARANCEUR CLEAR 10/29/2024 1728   APPEARANCEUR Clear 04/29/2022 1259   LABSPEC 1.015 10/29/2024 1728   PHURINE 7.0 10/29/2024 1728   GLUCOSEU NEGATIVE 10/29/2024 1728   HGBUR NEGATIVE 10/29/2024 1728   BILIRUBINUR NEGATIVE 10/29/2024 1728   BILIRUBINUR Negative 04/29/2022 1259   KETONESUR NEGATIVE 10/29/2024 1728   PROTEINUR NEGATIVE 10/29/2024  1728   NITRITE POSITIVE (A) 10/29/2024 1728   LEUKOCYTESUR NEGATIVE 10/29/2024 1728   Sepsis Labs Recent Labs  Lab 11/03/24 0359  WBC 6.3   Microbiology Recent Results (from the past 240 hours)  Urine Culture     Status: Abnormal   Collection Time: 10/28/24  6:30 PM   Specimen: Urine, Clean Catch  Result Value Ref Range Status   Specimen Description URINE, CLEAN CATCH  Final   Special Requests   Final    NONE Performed at [facility], [address], [place], [place] [address]  Culture MULTIPLE SPECIES PRESENT, SUGGEST RECOLLECTION (A)  Final   Report Status 10/30/2024 FINAL  Final  Blood culture (routine x 2)     Status: None   Collection Time: 10/29/24  5:14 PM   Specimen: BLOOD  Result Value Ref Range Status   Specimen Description BLOOD SITE NOT SPECIFIED  Final   Special Requests   Final    BOTTLES DRAWN AEROBIC AND ANAEROBIC Blood Culture results may not be optimal due to an inadequate volume of blood received in culture bottles   Culture   Final    NO GROWTH 5 DAYS Performed at [facility], [address], [place], [place] [address]    Report Status 11/03/2024 FINAL  Final  Blood culture (routine x 2)     Status: None   Collection Time: 10/29/24  5:14 PM   Specimen: BLOOD  Result Value Ref Range Status   Specimen Description BLOOD SITE NOT SPECIFIED  Final   Special Requests   Final    BOTTLES DRAWN AEROBIC AND ANAEROBIC Blood Culture results may not be optimal due to an inadequate volume of blood received in culture bottles   Culture   Final    NO GROWTH 5 DAYS Performed at [facility], [address] [place], [place] [address]    Report Status 11/03/2024 FINAL  Final     Time coordinating discharge: Over 30 minutes  SIGNED:   Brayton Lye, MD  [facility] 11/07/2024, 1:15 PM Pager   If 7PM-7AM, please contact night-coverage [email or web address] Password [email or web address]    [1] No Known Allergies

## 2024-11-07 NOTE — Progress Notes (Signed)
 PT BEING DISCHARGED TO [facility]. REPORT GIVEN TO LASHAND, LPN AT THIS TIME. PT WAITING [facility] FOR TRANSPORT.

## 2024-11-07 NOTE — TOC Transition Note (Signed)
 Transition of Care [facility]) - Discharge Note   Patient Details  Name: Mary Villanueva MRN: [id number] Date of Birth: 1956-07-23  [facility] CM/SW Contact:  Dino HERO Au, LCSWA Phone Number: 11/07/2024, 1:10 PM   Clinical Narrative:     SW informed by Yolande [facility] [phone]) bed available today. Call [phone] Bed: 106p  Per MD, pt medically stable.   SW met with pt at bedside, informed of ready bed and d/c today. Pt requested ambulance transport. Pt reports she will update her family.   [facility] called    Final next level of care: Skilled Nursing Facility Barriers to Discharge: Barriers Resolved   Patient Goals and CMS Choice   CMS Medicare.gov Compare Post Acute Care list provided to:: Patient Choice offered to / list presented to : Patient      Discharge Placement              Patient chooses bed at: [facility] Patient to be transferred to facility by: [facility] Name of family member notified: pt will notify family Patient and family notified of of transfer: 11/07/24  Discharge Plan and Services Additional resources added to the After Visit Summary for                                       Social Drivers of Health (SDOH) Interventions SDOH Screenings   Food Insecurity: No Food Insecurity (10/30/2024)  Housing: Low Risk (10/30/2024)  Transportation Needs: No Transportation Needs (10/30/2024)  Utilities: Not At Risk (10/30/2024)  Social Connections: Moderately Integrated (10/30/2024)  Tobacco Use: Medium Risk (10/29/2024)     Readmission Risk Interventions     No data to display

## 2024-11-07 NOTE — Progress Notes (Signed)
 PROGRESS NOTE    Mary Villanueva  FMW:[id number] DOB: Nov 28, 1955 DOA: 10/29/2024 PCP: Larnell Hamilton, MD    Brief Narrative:   This 68 y.o. female with PMH significant for Multiple sclerosis,  wheelchair-bound, history of MS for 40 years initially stable however progressively getting worse over last 10 years, chronic weakness of the bilateral lower extremities, gait abnormality, impaired mobility, spasticity, depression, anemia, anxiety, overactive bladder, CKD stage IIIa, hypothyroidism, and essential hypertension presented to the ED with progressively worsening generalized weakness for 3 days.  Patient had a fall from the wheelchair 3 days ago and had UTI 1 week ago.  She has been taking antibiotics prescribed by PCP without any improvement.  Patient reports since the fall her mobility is more restricted and She is unable to bear weight specifically on the left lower extremity.  She has developed left ankle swelling and pain.  She was hemodynamically stable in the ED.  UA consistent with UTI.  Patient had MRI, C-spine T-spine and lumbar spine.  MRI > No new or enhancing lesions.  Similar age advanced white matter T2 hyperintensities compatible with known multiple sclerosis.  Patient was admitted for further evaluation and was continued on IV antibiotics.   Assessment & Plan:   Acute cystitis along with dehydration: Patient presented to the ED with recurrent falls, worsening bilateral lower extremity weakness. She was diagnosed with UTI and started on oral antibiotics last week. Blood culture remained negative, unfortunately no urine culture was sent on admission -Treated with IV Rocephin , finished total of 5 days,   History multiple sclerosis History of chronic lower extremity weakness,  wheelchair-bound at baseline. Recent history of recurrent falls due to bilateral lower extremity weakness. History of gait abnormality. Wheelchair dependence Chronic muscle spasticity. She presented  with worsening bilateral extremities weakness, frequent falls while transferring  from wheelchair to bed.   Patient denies any blurry vision, headache, ataxia, limb paresthesia, chest pain, abdominal pain, shortness of breath, ophthalmoplegia, vertigo, hearing loss, sensory change hyperreflexia. In the ED extensive imaging has been completed. Chest x-ray >  No active disease process. X-ray hip  > No evidence of fracture or dislocation. X-ray of the left knee >  No evidence of fracture or dislocation. MRI brain > no acute intra cranial abnormality.  No new or enhancing lesion. MRI C-spine ,  thoracic spine and lumbar spine >  No evidence of abnormal enhancement. At this time there is no concern for MS flare. -PT/OT consulted, recommendation for SNF placement  Lower extremity spasms - Potassium and magnesium  within normal limit -Continue baclofen  10 mg 3 times daily as needed. - Increased her tizanidine  to 3 times daily, reports spasms much improved .  Constipation - Improved with bowel regimen  History of hypothyroidism: Continue levothyroxine .  History of depression: Continue Prozac .   History of overactive bladder: Continue In-N-Out cath as needed and oxybutynin .   History of CKD stage IIIa: Stable renal function.  Avoid nephrotoxic medications.   History of essential hypertension Continue irbesartan .  Left ankle swelling: Venous Dopplers was obtained, negative for DVT  DVT prophylaxis: Lovenox  Code Status: Full code Family Communication: No family at bedside Disposition Plan: Medically stable for discharge to SNF, still pending insurance approval and bed availability     Consultants:  None  Procedures:  MRI  Antimicrobials:  Anti-infectives (From admission, onward)    Start     Dose/Rate Route Frequency Ordered Stop   10/30/24 0600  cefTRIAXone  (ROCEPHIN ) 1 g in sodium chloride  0.9 % 100  mL IVPB  Status:  Discontinued        1 g 200 mL/hr over 30 Minutes  Intravenous Every 24 hours 10/29/24 2056 11/03/24 1129   10/29/24 1730  cefTRIAXone  (ROCEPHIN ) 2 g in sodium chloride  0.9 % 100 mL IVPB        2 g 200 mL/hr over 30 Minutes Intravenous  Once 10/29/24 1728 10/29/24 1837      Subjective:   Awake Alert, Oriented X 3 CTAB RRR +ve B.Sounds, Abd Soft No Cyanosis, Clubbing or edema, No new Rash or bruise      Objective: Vitals:   11/06/24 2337 11/07/24 0516 11/07/24 0816 11/07/24 1157  BP: (!) 112/46 126/69    Pulse: 75 74    Resp: 16 15    Temp: 99.1 F (37.3 C) 98.7 F (37.1 C) 98.3 F (36.8 C) 98.4 F (36.9 C)  TempSrc: Oral Oral Oral Oral  SpO2: 95% 93%    Weight:      Height:        Intake/Output Summary (Last 24 hours) at 11/07/2024 1219 Last data filed at 11/07/2024 0500 Gross per 24 hour  Intake --  Output 950 ml  Net -950 ml   Filed Weights   10/29/24 1728  Weight: 65.8 kg    Examination:  Awake, alert, no apparent distress Good air entry bilaterally Regular rate and rhythm +ve B.Sounds, Abd Soft No Cyanosis, Clubbing or edema, No new Rash or bruise      Data Review:   Patient Lines/Drains/Airways Status     Active Line/Drains/Airways     Name Placement date Placement time Site Days   Peripheral IV 10/29/24 Anterior;Right Forearm 10/29/24  1807  Forearm  9   External Urinary Catheter 10/30/24  1230  --  8             Inpatient Medications  Scheduled Meds:  baclofen   20 mg Oral TID   enoxaparin  (LOVENOX ) injection  40 mg Subcutaneous Q24H   FLUoxetine   60 mg Oral Daily   levothyroxine   50 mcg Oral Q0600   magnesium  citrate  0.5 Bottle Oral Once   oxybutynin   10 mg Oral QHS   rosuvastatin   20 mg Oral QHS   sodium chloride  flush  3 mL Intravenous Q12H   sodium chloride  flush  3 mL Intravenous Q12H   tiZANidine   4 mg Oral TID   Continuous Infusions:   PRN Meds:.acetaminophen  **OR** acetaminophen , ondansetron  **OR** ondansetron  (ZOFRAN ) IV, sodium chloride  flush  DVT  Prophylaxis  enoxaparin  (LOVENOX ) injection 40 mg Start: 10/29/24 2200 SCDs Start: 10/29/24 2057 Place TED hose Start: 10/29/24 2057    Recent Labs  Lab 11/03/24 0359  WBC 6.3  HGB 11.4*  HCT 33.5*  PLT 221  MCV 92.5  MCH 31.5  MCHC 34.0  RDW 12.4  LYMPHSABS 0.8  MONOABS 0.6  EOSABS 0.1  BASOSABS 0.0    Recent Labs  Lab 11/03/24 0359  NA 139  K 4.0  CL 107  CO2 26  ANIONGAP 6  GLUCOSE 119*  BUN 17  CREATININE 1.22*  MG 2.0  PHOS 3.1  CALCIUM  8.1*      Recent Labs  Lab 11/03/24 0359  MG 2.0  CALCIUM  8.1*    --------------------------------------------------------------------------------------------------------------- No results found for: CHOL, HDL, LDLCALC, LDLDIRECT, TRIG, CHOLHDL  Lab Results  Component Value Date   HGBA1C 5.6 08/02/2020   No results for input(s): TSH, T4TOTAL, FREET4, T3FREE, THYROIDAB in the last 72 hours. No results for input(s): VITAMINB12,  FOLATE, FERRITIN, TIBC, IRON, RETICCTPCT in the last 72 hours. ------------------------------------------------------------------------------------------------------------------ Cardiac Enzymes No results for input(s): CKMB, TROPONINI, MYOGLOBIN in the last 168 hours.  Invalid input(s): CK  Micro Results Recent Results (from the past 240 hours)  Urine Culture     Status: Abnormal   Collection Time: 10/28/24  6:30 PM   Specimen: Urine, Clean Catch  Result Value Ref Range Status   Specimen Description URINE, CLEAN CATCH  Final   Special Requests   Final    NONE Performed at [facility], [address], [place], [place] [address]    Culture MULTIPLE SPECIES PRESENT, SUGGEST RECOLLECTION (A)  Final   Report Status 10/30/2024 FINAL  Final  Blood culture (routine x 2)     Status: None   Collection Time: 10/29/24  5:14 PM   Specimen: BLOOD  Result Value Ref Range Status   Specimen Description BLOOD SITE NOT SPECIFIED  Final   Special  Requests   Final    BOTTLES DRAWN AEROBIC AND ANAEROBIC Blood Culture results may not be optimal due to an inadequate volume of blood received in culture bottles   Culture   Final    NO GROWTH 5 DAYS Performed at [facility], [address], [place], [place] [address]    Report Status 11/03/2024 FINAL  Final  Blood culture (routine x 2)     Status: None   Collection Time: 10/29/24  5:14 PM   Specimen: BLOOD  Result Value Ref Range Status   Specimen Description BLOOD SITE NOT SPECIFIED  Final   Special Requests   Final    BOTTLES DRAWN AEROBIC AND ANAEROBIC Blood Culture results may not be optimal due to an inadequate volume of blood received in culture bottles   Culture   Final    NO GROWTH 5 DAYS Performed at [facility], [address], [place], [place] [address]    Report Status 11/03/2024 FINAL  Final    Radiology Reports  DG Abd Portable 1V Result Date: 11/06/2024 CLINICAL DATA:  Constipation. EXAM: PORTABLE ABDOMEN - 1 VIEW COMPARISON:  06/17/2018 FINDINGS: Asymmetric elevation right hemidiaphragm. Diffuse distention of small bowel and colon is seen through the abdomen and pelvis. No rectal gas evident. No substantial stool volume by x-ray. IMPRESSION: Diffuse gaseous distention of small bowel and colon through the abdomen and pelvis. No substantial stool volume by x-ray. Findings likely reflect underlying ileus. CT imaging could be used to further evaluate as clinically warranted. Electronically Signed   By: Camellia Candle M.D.   On: 11/06/2024 10:07       Signature  -   Brayton Lye M.D on 11/07/2024 at 12:19 PM   -  To page go to [email or web address]

## 2024-12-03 ENCOUNTER — Emergency Department (HOSPITAL_COMMUNITY)

## 2024-12-03 ENCOUNTER — Inpatient Hospital Stay (HOSPITAL_COMMUNITY)
Admission: EM | Admit: 2024-12-03 | Discharge: 2024-12-14 | DRG: 689 | Disposition: A | Discharge status: Skilled Nursing Facility | Source: Skilled Nursing Facility | Attending: Internal Medicine | Admitting: Internal Medicine

## 2024-12-03 ENCOUNTER — Encounter (HOSPITAL_COMMUNITY): Payer: Self-pay | Admitting: *Deleted

## 2024-12-03 ENCOUNTER — Other Ambulatory Visit: Payer: Self-pay

## 2024-12-03 DIAGNOSIS — E871 Hypo-osmolality and hyponatremia: Secondary | ICD-10-CM | POA: Diagnosis present

## 2024-12-03 DIAGNOSIS — I129 Hypertensive chronic kidney disease with stage 1 through stage 4 chronic kidney disease, or unspecified chronic kidney disease: Secondary | ICD-10-CM | POA: Diagnosis present

## 2024-12-03 DIAGNOSIS — N3000 Acute cystitis without hematuria: Principal | ICD-10-CM | POA: Diagnosis present

## 2024-12-03 DIAGNOSIS — E860 Dehydration: Secondary | ICD-10-CM | POA: Diagnosis present

## 2024-12-03 DIAGNOSIS — I10 Essential (primary) hypertension: Secondary | ICD-10-CM

## 2024-12-03 DIAGNOSIS — G9340 Encephalopathy, unspecified: Secondary | ICD-10-CM | POA: Diagnosis present

## 2024-12-03 DIAGNOSIS — G9341 Metabolic encephalopathy: Secondary | ICD-10-CM | POA: Diagnosis present

## 2024-12-03 DIAGNOSIS — F419 Anxiety disorder, unspecified: Secondary | ICD-10-CM | POA: Diagnosis present

## 2024-12-03 DIAGNOSIS — R410 Disorientation, unspecified: Secondary | ICD-10-CM | POA: Diagnosis not present

## 2024-12-03 DIAGNOSIS — Z833 Family history of diabetes mellitus: Secondary | ICD-10-CM

## 2024-12-03 DIAGNOSIS — E873 Alkalosis: Secondary | ICD-10-CM | POA: Diagnosis present

## 2024-12-03 DIAGNOSIS — N1831 Chronic kidney disease, stage 3a: Secondary | ICD-10-CM | POA: Diagnosis present

## 2024-12-03 DIAGNOSIS — Z6828 Body mass index (BMI) 28.0-28.9, adult: Secondary | ICD-10-CM

## 2024-12-03 DIAGNOSIS — Z993 Dependence on wheelchair: Secondary | ICD-10-CM

## 2024-12-03 DIAGNOSIS — F32A Depression, unspecified: Secondary | ICD-10-CM | POA: Diagnosis present

## 2024-12-03 DIAGNOSIS — Z79890 Hormone replacement therapy (postmenopausal): Secondary | ICD-10-CM

## 2024-12-03 DIAGNOSIS — N179 Acute kidney failure, unspecified: Secondary | ICD-10-CM | POA: Diagnosis present

## 2024-12-03 DIAGNOSIS — Z7401 Bed confinement status: Secondary | ICD-10-CM

## 2024-12-03 DIAGNOSIS — Z79899 Other long term (current) drug therapy: Secondary | ICD-10-CM

## 2024-12-03 DIAGNOSIS — G35D Multiple sclerosis, unspecified: Secondary | ICD-10-CM | POA: Diagnosis present

## 2024-12-03 DIAGNOSIS — E669 Obesity, unspecified: Secondary | ICD-10-CM | POA: Diagnosis present

## 2024-12-03 DIAGNOSIS — N3281 Overactive bladder: Secondary | ICD-10-CM | POA: Diagnosis present

## 2024-12-03 DIAGNOSIS — R531 Weakness: Secondary | ICD-10-CM

## 2024-12-03 DIAGNOSIS — N390 Urinary tract infection, site not specified: Secondary | ICD-10-CM

## 2024-12-03 DIAGNOSIS — B952 Enterococcus as the cause of diseases classified elsewhere: Secondary | ICD-10-CM | POA: Diagnosis present

## 2024-12-03 DIAGNOSIS — Z7983 Long term (current) use of bisphosphonates: Secondary | ICD-10-CM

## 2024-12-03 DIAGNOSIS — Z7409 Other reduced mobility: Secondary | ICD-10-CM | POA: Diagnosis present

## 2024-12-03 DIAGNOSIS — Z1152 Encounter for screening for COVID-19: Secondary | ICD-10-CM

## 2024-12-03 DIAGNOSIS — I959 Hypotension, unspecified: Secondary | ICD-10-CM | POA: Diagnosis present

## 2024-12-03 DIAGNOSIS — Z87891 Personal history of nicotine dependence: Secondary | ICD-10-CM

## 2024-12-03 DIAGNOSIS — Z8249 Family history of ischemic heart disease and other diseases of the circulatory system: Secondary | ICD-10-CM

## 2024-12-03 DIAGNOSIS — E7800 Pure hypercholesterolemia, unspecified: Secondary | ICD-10-CM | POA: Diagnosis present

## 2024-12-03 DIAGNOSIS — Z823 Family history of stroke: Secondary | ICD-10-CM

## 2024-12-03 DIAGNOSIS — E039 Hypothyroidism, unspecified: Secondary | ICD-10-CM | POA: Diagnosis present

## 2024-12-03 LAB — URINALYSIS, W/ REFLEX TO CULTURE (INFECTION SUSPECTED)
Bilirubin Urine: NEGATIVE
Glucose, UA: NEGATIVE mg/dL
Hgb urine dipstick: NEGATIVE
Ketones, ur: NEGATIVE mg/dL
Nitrite: NEGATIVE
Protein, ur: NEGATIVE mg/dL
Specific Gravity, Urine: 1.011 (ref 1.005–1.030)
pH: 5 (ref 5.0–8.0)

## 2024-12-03 LAB — CBC WITH DIFFERENTIAL/PLATELET
Abs Immature Granulocytes: 0.04 K/uL (ref 0.00–0.07)
Basophils Absolute: 0.1 K/uL (ref 0.0–0.1)
Basophils Relative: 1 %
Eosinophils Absolute: 0.1 K/uL (ref 0.0–0.5)
Eosinophils Relative: 2 %
HCT: 39.2 % (ref 36.0–46.0)
Hemoglobin: 13.2 g/dL (ref 12.0–15.0)
Immature Granulocytes: 1 %
Lymphocytes Relative: 13 %
Lymphs Abs: 1.1 K/uL (ref 0.7–4.0)
MCH: 31.1 pg (ref 26.0–34.0)
MCHC: 33.7 g/dL (ref 30.0–36.0)
MCV: 92.5 fL (ref 80.0–100.0)
Monocytes Absolute: 0.8 K/uL (ref 0.1–1.0)
Monocytes Relative: 10 %
Neutro Abs: 6 K/uL (ref 1.7–7.7)
Neutrophils Relative %: 73 %
Platelets: 326 K/uL (ref 150–400)
RBC: 4.24 MIL/uL (ref 3.87–5.11)
RDW: 12.6 % (ref 11.5–15.5)
WBC: 8.1 K/uL (ref 4.0–10.5)
nRBC: 0 % (ref 0.0–0.2)

## 2024-12-03 LAB — I-STAT VENOUS BLOOD GAS, ED
Acid-Base Excess: 1 mmol/L (ref 0.0–2.0)
Bicarbonate: 23.9 mmol/L (ref 20.0–28.0)
Calcium, Ion: 1.05 mmol/L — ABNORMAL LOW (ref 1.15–1.40)
HCT: 38 % (ref 36.0–46.0)
Hemoglobin: 12.9 g/dL (ref 12.0–15.0)
O2 Saturation: 87 %
Potassium: 4.8 mmol/L (ref 3.5–5.1)
Sodium: 135 mmol/L (ref 135–145)
TCO2: 25 mmol/L (ref 22–32)
pCO2, Ven: 33.1 mmHg — ABNORMAL LOW (ref 44–60)
pH, Ven: 7.467 — ABNORMAL HIGH (ref 7.25–7.43)
pO2, Ven: 49 mmHg — ABNORMAL HIGH (ref 32–45)

## 2024-12-03 LAB — RESP PANEL BY RT-PCR (RSV, FLU A&B, COVID)  RVPGX2
Influenza A by PCR: NEGATIVE
Influenza B by PCR: NEGATIVE
Resp Syncytial Virus by PCR: NEGATIVE
SARS Coronavirus 2 by RT PCR: NEGATIVE

## 2024-12-03 LAB — PRO BRAIN NATRIURETIC PEPTIDE: Pro Brain Natriuretic Peptide: <50 pg/mL

## 2024-12-03 LAB — COMPREHENSIVE METABOLIC PANEL WITH GFR
ALT: 24 U/L (ref 0–44)
AST: 32 U/L (ref 15–41)
Albumin: 3.7 g/dL (ref 3.5–5.0)
Alkaline Phosphatase: 73 U/L (ref 38–126)
Anion gap: 14 (ref 5–15)
BUN: 23 mg/dL (ref 8–23)
CO2: 21 mmol/L — ABNORMAL LOW (ref 22–32)
Calcium: 9 mg/dL (ref 8.9–10.3)
Chloride: 99 mmol/L (ref 98–111)
Creatinine, Ser: 1.31 mg/dL — ABNORMAL HIGH (ref 0.44–1.00)
GFR, Estimated: 44 mL/min — ABNORMAL LOW
Glucose, Bld: 81 mg/dL (ref 70–99)
Potassium: 4.8 mmol/L (ref 3.5–5.1)
Sodium: 134 mmol/L — ABNORMAL LOW (ref 135–145)
Total Bilirubin: 0.4 mg/dL (ref 0.0–1.2)
Total Protein: 6.8 g/dL (ref 6.5–8.1)

## 2024-12-03 LAB — I-STAT CHEM 8, ED
BUN: 27 mg/dL — ABNORMAL HIGH (ref 8–23)
Calcium, Ion: 1.04 mmol/L — ABNORMAL LOW (ref 1.15–1.40)
Chloride: 101 mmol/L (ref 98–111)
Creatinine, Ser: 1.5 mg/dL — ABNORMAL HIGH (ref 0.44–1.00)
Glucose, Bld: 82 mg/dL (ref 70–99)
HCT: 40 % (ref 36.0–46.0)
Hemoglobin: 13.6 g/dL (ref 12.0–15.0)
Potassium: 4.8 mmol/L (ref 3.5–5.1)
Sodium: 136 mmol/L (ref 135–145)
TCO2: 23 mmol/L (ref 22–32)

## 2024-12-03 LAB — I-STAT CG4 LACTIC ACID, ED
Lactic Acid, Venous: 1.2 mmol/L (ref 0.5–1.9)
Lactic Acid, Venous: 1.3 mmol/L (ref 0.5–1.9)

## 2024-12-03 LAB — PROTIME-INR
INR: 1 (ref 0.8–1.2)
Prothrombin Time: 13.9 s (ref 11.4–15.2)

## 2024-12-03 LAB — TROPONIN T, HIGH SENSITIVITY
Troponin T High Sensitivity: 38 ng/L — ABNORMAL HIGH (ref 0–19)
Troponin T High Sensitivity: 39 ng/L — ABNORMAL HIGH (ref 0–19)

## 2024-12-03 LAB — AMMONIA: Ammonia: 23 umol/L (ref 9–35)

## 2024-12-03 MED ORDER — ACETAMINOPHEN 325 MG PO TABS
650.0000 mg | ORAL_TABLET | Freq: Four times a day (QID) | ORAL | Status: DC | PRN
  Administered 2024-12-05 – 2024-12-14 (×6): 650 mg via ORAL
  Filled 2024-12-03 (×7): qty 2

## 2024-12-03 MED ORDER — LACTATED RINGERS IV SOLN: INTRAVENOUS | Status: AC

## 2024-12-03 MED ORDER — ENOXAPARIN SODIUM 30 MG/0.3ML IJ SOSY
30.0000 mg | PREFILLED_SYRINGE | INTRAMUSCULAR | Status: DC
  Administered 2024-12-04 – 2024-12-09 (×6): 30 mg via SUBCUTANEOUS
  Filled 2024-12-03 (×5): qty 0.3

## 2024-12-03 MED ORDER — IOHEXOL 350 MG/ML SOLN
65.0000 mL | Freq: Once | INTRAVENOUS | Status: AC | PRN
  Administered 2024-12-03: 65 mL via INTRAVENOUS

## 2024-12-03 MED ORDER — LACTATED RINGERS IV BOLUS
1000.0000 mL | Freq: Once | INTRAVENOUS | Status: AC
  Administered 2024-12-03: 1000 mL via INTRAVENOUS

## 2024-12-03 MED ORDER — BISACODYL 5 MG PO TBEC
5.0000 mg | DELAYED_RELEASE_TABLET | Freq: Every day | ORAL | Status: DC | PRN
  Administered 2024-12-08: 5 mg via ORAL
  Filled 2024-12-03: qty 1

## 2024-12-03 MED ORDER — ACETAMINOPHEN 650 MG RE SUPP
650.0000 mg | Freq: Four times a day (QID) | RECTAL | Status: DC | PRN
  Administered 2024-12-05: 650 mg via RECTAL
  Filled 2024-12-03: qty 1

## 2024-12-03 MED ORDER — ONDANSETRON HCL 4 MG PO TABS: 4.0000 mg | ORAL_TABLET | Freq: Four times a day (QID) | ORAL | Status: DC | PRN

## 2024-12-03 MED ORDER — ONDANSETRON HCL 4 MG/2ML IJ SOLN
4.0000 mg | Freq: Four times a day (QID) | INTRAMUSCULAR | Status: DC | PRN
  Administered 2024-12-04: 4 mg via INTRAVENOUS
  Filled 2024-12-03: qty 2

## 2024-12-03 MED ORDER — SODIUM CHLORIDE 0.9 % IV SOLN
2.0000 g | Freq: Once | INTRAVENOUS | Status: AC
  Administered 2024-12-03: 2 g via INTRAVENOUS
  Filled 2024-12-03: qty 20

## 2024-12-03 MED ORDER — LACTATED RINGERS IV BOLUS: 1000.0000 mL | Freq: Once | INTRAVENOUS | Status: DC

## 2024-12-03 MED ORDER — KETOROLAC TROMETHAMINE 30 MG/ML IJ SOLN
30.0000 mg | Freq: Once | INTRAMUSCULAR | Status: AC
  Administered 2024-12-03: 30 mg via INTRAVENOUS
  Filled 2024-12-03: qty 1

## 2024-12-03 MED ORDER — SENNOSIDES-DOCUSATE SODIUM 8.6-50 MG PO TABS: 1.0000 | ORAL_TABLET | Freq: Every evening | ORAL | Status: DC | PRN

## 2024-12-03 NOTE — ED Provider Notes (Signed)
 " [facility] Provider Note   HPI/ROS    History obtained from EMS.  Mary Villanueva is a 69 y.o. female who presents for Altered Mental Status and who  has a past medical history of Abdominal pain (05/2018), Depression, High cholesterol, Hypercholesteremia, Hypertension, and MS (multiple sclerosis).  Patient presents today from facility for altered mental status.  Per EMS the facility states she has been screaming out all day but has not been making much sense.  She is usually talkative at baseline, but does not ambulate secondary to MS.  Facility had been giving multiple doses of anxiolysis and pain medications while at the facility today with no improvement.  When EMS got there patient was stable vitally, but had brief periods of apnea.  They tried Narcan for the patient which had some improvement in her symptoms, but she still was not answering questions or responding.  On arrival patient is GCS 9, vitally stable, but not answering any questions.  History limited due to limited EMS history and patient unable to answer properly at this time.  MDM   I have reviewed the nursing documentation, vital signs, as well as the past medical history, surgical history, family history, and social history.  Initial Assessment:  Hemodynamically stable on initial evaluation, but continuously screaming.  Not making any sense at this time during conversation.  Has had a history of UTIs in the past, so could be sepsis.  Could also be intra-abdominal infection, bleed, or head bleed.  Will obtain broad workup including CT head, CT on pelvis with contrast, CT PE, and broad labs including ammonia, BNP, Trope, gas, lactic, and regular labs.  Labs here are largely unremarkable aside from possible UTI.  UA not overtly concerning but urine in bag appears very dirty.  Otherwise no significant leukocytosis, anemia, or thrombocytopenia.  Creatinine stable with only mild  hyponatremia.  No other significant Electra abnormalities or transaminitis.  VBG mildly alkalotic but without significant signs of acidosis or hypercarbia.  Troponin mildly elevated at 38, with a flat delta 39.  Ammonia within normal limits and COVID flu negative.  All imaging unremarkable at this time.  Still have no obvious etiology of why she is altered.  No obvious medications on her list that would be causing this.  Will plan for admission for altered mentation.  Still unclear etiology at this time but will treat UTI with Rocephin .  Spoke with medicine team regarding admission and they agree.  Did discuss that this could possibly be an MS flare, but this would be abnormal for her.  Not currently a good candidate for MRI at this time given level of agitation.  Could just be delirium given waxing waning mentation, patient will be admitted to medicine for further workup and management.  Disposition:  I discussed the case with Dr.Prosper who graciously agreed to admit the patient to their service for continued care.   This patient was staffed with Dr. Randol who supervised the visit and agreed with the plan of care.   Due to the patients current presenting symptoms, physical exam findings, and the workup stated above, it is thought that the etiology of the patients current presentation is:  1. Disorientation   2. Lower urinary tract infectious disease     Clinical Complexity A medically appropriate history, review of systems, and physical exam was performed.  Factors that affect the complexity of this encounter: assessment of correct protocol, laboratory work from this visit, and  review of echocardiogram/EKG results  My independent interpretations of diagnostic studies are documented in the ED course above.   If decision rules were used in this patient's evaluation, they are listed below.   Click here for ABCD2, HEART and other calculators  Patient's presentation is most consistent with  acute complicated illness / injury requiring diagnostic workup.  MDM generated using voice dictation software and may contain dictation errors. Please contact me for any clarification or with any questions.    Physical Exam, PMH, PSH, Family History, and Social Hsitory   Vitals:   12/03/24 2145 12/03/24 2200 12/03/24 2215 12/03/24 2230  BP: 132/83 (!) 142/125 (!) 93/29 (!) 82/53  Pulse: (!) 101 (!) 102 95 95  Resp: 14 (!) 22 12 (!) 6  SpO2: 100% 100% 98% 100%    Physical Exam Constitutional:      Appearance: She is ill-appearing.  HENT:     Head: Normocephalic and atraumatic.  Eyes:     Conjunctiva/sclera: Conjunctivae normal.     Pupils: Pupils are equal, round, and reactive to light.  Cardiovascular:     Rate and Rhythm: Normal rate and regular rhythm.  Pulmonary:     Effort: Tachypnea present.     Breath sounds: No decreased breath sounds or wheezing.  Abdominal:     General: Abdomen is flat. There is no distension.     Palpations: Abdomen is soft.     Tenderness: There is abdominal tenderness. There is no guarding.  Neurological:     GCS: GCS eye subscore is 2. GCS verbal subscore is 2. GCS motor subscore is 5.     Comments: Contracted right lower extremity, does move somewhat.  Left lower extremity.  Moves upper extremities bilaterally.  Unable to assess sensation.     Past Medical History:  Diagnosis Date   Abdominal pain 05/2018   Depression    High cholesterol    Hypercholesteremia    Hypertension    MS (multiple sclerosis)      Past Surgical History:  Procedure Laterality Date   BREAST REDUCTION SURGERY  1998     Family History  Problem Relation Age of Onset   Stroke Mother    Heart disease Father    Diabetes Sister    Diabetes Brother     Social History   Tobacco Use   Smoking status: Former    Types: Cigarettes   Smokeless tobacco: Never   Tobacco comments:    Quit 36 Years ago.  Substance Use Topics   Alcohol use: Yes    Alcohol/week:  1.0 standard drink of alcohol    Types: 1 Glasses of wine per week    Comment: ralely     Procedures   If procedures were preformed on this patient, they are listed below:  Procedures   Electronically signed by:   Glendia Carlin Ancona, M.D. PGY-2, Emergency Medicine   Please note that this documentation was produced with the assistance of voice-to-text technology and may contain errors.    Ancona Glendia, MD 12/03/24 2330  "

## 2024-12-03 NOTE — ED Triage Notes (Signed)
 Pt arrives via [facility] from [facility]. Facility reports patient screaming in pain with AMS since last evening and that they had been trying to give her pain medications and ativan  to calm her down. Per EMS patient more altered today prompting facility to call EMS. In route, EMS reports patient resp rate decreased to 6 and she had a period of apnea. EMS administered 2mg  Narcan. Per facility, pt w/ hx of MS and wheelchair bound however baseline AAO X4. Pt arrives on [place], disoriented x4 and screaming. 18G LAC   EMS Vitals BP 140/80 O2 99% 2L [place] HR 90  EKG unremarkable

## 2024-12-03 NOTE — H&P (Signed)
 " History and Physical  Mary Villanueva FMW:[id number] DOB: [birth year] DOA: 12/03/2024  PCP: Larnell Hamilton, MD   Chief Complaint: Altered mental status  HPI: Mary Villanueva is a 69 y.o. female with medical history significant for multiple sclerosis wheelchair-bound, history of MS for 40 years initially stable however progressively getting worse over last 10 years, chronic BLE weakness, gait abnormality, impaired mobility, spasticity, depression, anemia, anxiety, overactive bladder, CKD stage IIIa, hypothyroidism, and hypertension who presented via EMS from SNF for evaluation of altered mental status. Per EMS, since last evening, patient has been screaming in pain and has not been responding to staff as usual. They have attempted to give her pain medications and Ativan  to calm her down without any improvement in her mental status so EMS was called for evaluation. On EMS arrival, patient was noted to have decreased respiration to around 6 and 7 with a period of apnea. 2 mg of Narcan administered with improvement in respiration.  At the bedside, patient constantly screaming but has a few moments of calmness. Per sister, patient has a foul-smelling urine and seems to be in pain.  Reports that patient is usually alert and oriented x 3 at baseline.  ED Course: Initial vitals show RR 16-24, HR 90-100s, SBP 98-130s, SpO2 99% on room air. Initial labs significant for creatinine 1.31, bicarb 21, sodium 134, normal CBC, INR, proBNP, lactic acid, ammonia, negative COVID/RSV/flu test, unremarkable VBG, UA shows negative nitrite, large leuks, WBC 0-5 and few bacteria. EKG shows sinus rhythm. CXR shows no acute disease. CT A/P with no acute intra-abdominal pathology. CT head with no acute intracranial abnormality. CTA chest PE study negative for PE.  Pt received IV LR 1 L bolus and IV Rocephin . TRH was consulted for admission.   Review of Systems: Please see HPI for pertinent positives and negatives. A complete  10 system review of systems are otherwise negative.  Past Medical History:  Diagnosis Date   Abdominal pain 05/2018   Depression    High cholesterol    Hypercholesteremia    Hypertension    MS (multiple sclerosis)    Past Surgical History:  Procedure Laterality Date   BREAST REDUCTION SURGERY  1998   Social History:  reports that she has quit smoking. Her smoking use included cigarettes. She has never used smokeless tobacco. She reports current alcohol use of about 1.0 standard drink of alcohol per week. She reports that she does not use drugs.  Allergies[1]  Family History  Problem Relation Age of Onset   Stroke Mother    Heart disease Father    Diabetes Sister    Diabetes Brother      Prior to Admission medications  Medication Sig Start Date End Date Taking? Authorizing Provider  alendronate (FOSAMAX) 70 MG tablet Take 70 mg by mouth once a week.    [provider]  baclofen  (LIORESAL ) 10 MG tablet Take 1 tablet (10 mg total) by mouth 3 (three) times daily. 04/29/22   Onita Duos, MD  Biotin  10 MG CAPS Take 10 mg by mouth every morning.     [provider]  Cholecalciferol  (VITAMIN D -3) 1000 UNITS CAPS Take 1,000 Units by mouth 2 (two) times daily. Patient not taking: Reported on 10/30/2024    [provider]  FLUoxetine  (PROZAC ) 40 MG capsule TAKE 1 CAPSULE BY MOUTH 1  TIME DAILY WITH 20 MG  CAPSULE Patient taking differently: Take 60 mg by mouth daily. 10/30/18   Onita Duos, MD  levothyroxine  (SYNTHROID )  50 MCG tablet Take 50 mcg by mouth daily.    [provider]  Multiple Vitamins-Minerals (CENTRUM SILVER 50+WOMEN PO) Take 1 capsule by mouth daily. Patient not taking: Reported on 10/30/2024    [provider]  Omega-3 Fatty Acids (FISH OIL) 1200 MG CAPS Take 1,200 mg by mouth daily.    [provider]  oxybutynin  (DITROPAN -XL) 10 MG 24 hr tablet Take 10 mg by mouth at bedtime.  07/14/14   [provider]  Probiotic  Product (PROBIOTIC DAILY PO) Take 1 capsule by mouth daily. Patient not taking: Reported on 10/30/2024    [provider]  rosuvastatin  (CRESTOR ) 20 MG tablet Take 20 mg by mouth at bedtime. 03/19/22   [provider]  tiZANidine  (ZANAFLEX ) 4 MG tablet Take 1 tablet (4 mg total) by mouth at bedtime. Patient not taking: Reported on 10/30/2024 04/29/22   Onita Duos, MD  trimethoprim (TRIMPEX) 100 MG tablet Take 100 mg by mouth daily. 09/20/24   [provider]  vitamin B-12 (CYANOCOBALAMIN ) 500 MCG tablet Take 500 mcg by mouth daily. Patient not taking: Reported on 10/30/2024    [provider]    Physical Exam: BP 130/62   Pulse 92   Temp 99.3 F (37.4 C) (Axillary)   Resp (!) 7   SpO2 100%  General: Elderly woman laying in bed restless and screaming.  HEENT: [place]/AT. Anicteric sclera CV: RRR. No murmurs, rubs, or gallops. No LE edema Pulmonary: Lungs CTAB. Normal effort. No wheezing or rales. Abdominal: Soft, nontender, nondistended. Normal bowel sounds. Extremities: Palpable radial and DP pulses. Normal ROM. Skin: Warm and dry. No obvious rash or lesions. Neuro: Altered and screaming in pain. Does not follow any commands or answers any questions. Contraction of the right lower extremity.  Good movement of the upper extremities, minimal use of the lower extremities. Psych: Restless and agitated          Labs on Admission:  Basic Metabolic Panel: Recent Labs  Lab 12/03/24 2016 12/03/24 2027  NA 134* 135  136  K 4.8 4.8  4.8  CL 99 101  CO2 21*  --   GLUCOSE 81 82  BUN 23 27*  CREATININE 1.31* 1.50*  CALCIUM  9.0  --    Liver Function Tests: Recent Labs  Lab 12/03/24 2016  AST 32  ALT 24  ALKPHOS 73  BILITOT 0.4  PROT 6.8  ALBUMIN 3.7   No results for input(s): LIPASE, AMYLASE in the last 168 hours. Recent Labs  Lab 12/03/24 2018  AMMONIA 23   CBC: Recent Labs  Lab 12/03/24 2016 12/03/24 2027  WBC 8.1  --   NEUTROABS  6.0  --   HGB 13.2 12.9  13.6  HCT 39.2 38.0  40.0  MCV 92.5  --   PLT 326  --    Cardiac Enzymes: No results for input(s): CKTOTAL, CKMB, CKMBINDEX, TROPONINI in the last 168 hours. BNP (last 3 results) No results for input(s): BNP in the last 8760 hours.  ProBNP (last 3 results) Recent Labs    12/03/24 2016  PROBNP <50.0    CBG: No results for input(s): GLUCAP in the last 168 hours.  Radiological Exams on Admission: CT ABDOMEN PELVIS W CONTRAST Result Date: 12/03/2024 EXAM: CT ABDOMEN AND PELVIS WITH CONTRAST 12/03/2024 09:17:00 PM TECHNIQUE: CT of the abdomen and pelvis was performed with the administration of 65 mL of iohexol  (OMNIPAQUE ) 350 MG/ML injection. Multiplanar reformatted images are provided for review. Automated exposure control, iterative  reconstruction, and/or weight-based adjustment of the mA/kV was utilized to reduce the radiation dose to as low as reasonably achievable. COMPARISON: None available. CLINICAL HISTORY: Abdominal pain, acute, nonlocalized. FINDINGS: LOWER CHEST: No acute abnormality. LIVER: The liver is unremarkable. GALLBLADDER AND BILE DUCTS: Gallbladder is unremarkable. No biliary ductal dilatation. SPLEEN: No acute abnormality. PANCREAS: No acute abnormality. ADRENAL GLANDS: No acute abnormality. KIDNEYS, URETERS AND BLADDER: Subcentimeter hypodensity within the right kidney, too small to characterize - no further follow-up indicated. No stones in the kidneys or ureters. No hydronephrosis. No perinephric or periureteral stranding. Lobulated urinary contour with intramural calcified fibroids. Urinary bladder lumen decompressed with foley catheter tip and balloon terminating within its lumen. GI AND BOWEL: Tiny hiatal hernia. Stomach demonstrates no acute abnormality. No small or large bowel thickening or dilatation. Colonic diverticulosis. The appendix is unremarkable. There is no bowel obstruction. PERITONEUM AND RETROPERITONEUM: No ascites. No  free air. VASCULATURE: Moderate atherosclerotic plaque of the aorta. LYMPH NODES: No lymphadenopathy. REPRODUCTIVE ORGANS: No adnexal mass. BONES AND SOFT TISSUES: No acute osseous abnormality. No focal soft tissue abnormality. IMPRESSION: 1. No acute abnormality in the abdomen or pelvis. 2. Other, non-acute and/or normal findings as above. Electronically signed by: Morgane Naveau MD MD 12/03/2024 09:39 PM EST RP Workstation: [id number]   CT Angio Chest PE W and/or Wo Contrast Result Date: 12/03/2024 EXAM: CTA of the Chest without and with contrast for PE 12/03/2024 09:17:00 PM TECHNIQUE: CTA of the chest was performed after the administration of 65 mL of intravenous iohexol  (OMNIPAQUE ) 350 MG/ML injection. Multiplanar reformatted images are provided for review. MIP images are provided for review. Automated exposure control, iterative reconstruction, and/or weight based adjustment of the mA/kV was utilized to reduce the radiation dose to as low as reasonably achievable. COMPARISON: None available. CLINICAL HISTORY: Pulmonary embolism (PE) suspected, high prob. FINDINGS: PULMONARY ARTERIES: Pulmonary arteries are adequately opacified for evaluation. No pulmonary embolus. Limited evaluation of the subsegmental level due to motion artifact. Main pulmonary artery is normal in caliber. MEDIASTINUM: The heart and pericardium demonstrate no acute abnormality. Left Anterior Descending Coronary Artery calcification. Mild atherosclerotic plaque. The thoracic aorta is normal in caliber. LYMPH NODES: No mediastinal, hilar or axillary lymphadenopathy. LUNGS AND PLEURA: Elevated right hemidiaphragm with right middle lobe atelectasis. No focal consolidation or pulmonary edema. No pleural effusion or pneumothorax. UPPER ABDOMEN: Limited images of the upper abdomen demonstrate a tiny hiatal hernia. Otherwise, limited images of the upper abdomen are unremarkable. SOFT TISSUES AND BONES: Limited evaluation for acute rib fracture due  to motion artifact. No acute soft tissue abnormality. IMPRESSION: 1. No pulmonary embolism, with limited evaluation at the subsegmental level due to motion artifact. 2. No acute findings. 3. Other, non-acute and/or normal findings as above. Electronically signed by: Morgane Naveau MD MD 12/03/2024 09:32 PM EST RP Workstation: [id number]   CT Head Wo Contrast Result Date: 12/03/2024 EXAM: CT HEAD WITHOUT CONTRAST 12/03/2024 09:17:00 PM TECHNIQUE: CT of the head was performed without the administration of intravenous contrast. Automated exposure control, iterative reconstruction, and/or weight based adjustment of the mA/kV was utilized to reduce the radiation dose to as low as reasonably achievable. COMPARISON: CT head 08/01/2020. CLINICAL HISTORY: Mental status change, unknown cause. FINDINGS: BRAIN AND VENTRICLES: No acute hemorrhage. No evidence of acute infarct. No hydrocephalus. No extra-axial collection. No mass effect or midline shift. Cerebral ventricle sizes are concordant with the degree of cerebral volume loss. Periventricular white matter changes, likely sequela of chronic small vessel ischemic disease. ORBITS: No acute abnormality.  SINUSES: No acute abnormality. SOFT TISSUES AND SKULL: No acute soft tissue abnormality. No skull fracture. IMPRESSION: 1. No acute intracranial abnormality. Electronically signed by: Morgane Naveau MD MD 12/03/2024 09:27 PM EST RP Workstation: [id number]   DG Chest Portable 1 View Result Date: 12/03/2024 EXAM: 1 VIEW(S) XRAY OF THE CHEST 12/03/2024 09:02:00 PM COMPARISON: Chest x-ray 10/28/2024, CT angio chest 12/03/2024. CLINICAL HISTORY: Questionable sepsis - evaluate for abnormality. FINDINGS: LUNGS AND PLEURA: Elevated right hemidiaphragm. Right lung base atelectasis. No pleural effusion. No pneumothorax. HEART AND MEDIASTINUM: Aortic atherosclerosis. BONES AND SOFT TISSUES: Bowel beneath right hemidiaphragm. No acute osseous abnormality. IMPRESSION: 1. No acute  findings. Electronically signed by: Morgane Naveau MD MD 12/03/2024 09:20 PM EST RP Workstation: [id number]   Assessment/Plan Mary Villanueva is a 69 y.o. female with medical history significant for multiple sclerosis wheelchair-bound, history of MS for 40 years initially stable however progressively getting worse over last 10 years, chronic BLE weakness, gait abnormality, impaired mobility, spasticity, depression, anemia, anxiety, overactive bladder, CKD stage IIIa, hypothyroidism, and hypertension who presented via EMS from SNF for evaluation of altered mental status and admitted for acute encephalopathy and UTI.  # Acute encephalopathy - Patient with history of MS presented from SNF due to altered mental status, screaming and not responding as usual - Workup with labs and imaging overall nonrevealing but shows possible UTI - Patient with continued screaming and agitation in the ED, resolved after receiving IV Toradol  and IV Dilaudid  for pain - IV Dilaudid  as needed for pain - Patient has history of anxiety, can consider IV Ativan  with recurrent agitation - Delirium precautions  # Acute cystitis - Pt with hx of UTIs presented with altered mental status, and foul-smelling and cloudy urine - Urinalysis shows possible signs of infection - Continue IV rocephin  - F/u urine culture - Trend CBC and fever curve  # AKI on CKD 3A - Creatinine slightly elevated to 1.31 from baseline of 1.0-1.2 - Likely secondary to mild dehydration - Start IVLR at 150 cc/hr - Trend renal function and avoid nephrotoxic meds  # HTN - BP initially soft but currently stable with SBP in the 110-140s - Continue IV hydration as above - Resume BP meds after completion of med rec  # Generalized weakness # Impaired mobility/gait abnormality - In the setting of her MS and acute urinary infection - PT/OT eval and treat - Fall precautions  Chronic medical problems # Hypothyroidism # Overactive bladder #  Multiple sclerosis # Anxiety and depression - Resume meds after completion of full med rec and improvement in mental status   DVT prophylaxis: Lovenox      Code Status: Full Code  Consults called: None  Family Communication: Discussed results/findings and plan for admission with sister at bedside  Severity of Illness: The appropriate patient status for this patient is OBSERVATION. Observation status is judged to be reasonable and necessary in order to provide the required intensity of service to ensure the patient's safety. The patient's presenting symptoms, physical exam findings, and initial radiographic and laboratory data in the context of their medical condition is felt to place them at decreased risk for further clinical deterioration. Furthermore, it is anticipated that the patient will be medically stable for discharge from the hospital within 2 midnights of admission.   Level of care: Med-Surg    Lou Claretta HERO, MD 12/04/2024, 1:47 AM [facility] Pager: [phone] Isaiah 41:10   If 7PM-7AM, please contact night-coverage [email or web address] Password [email or web address]     [1] No Known  Allergies  "

## 2024-12-04 DIAGNOSIS — R531 Weakness: Secondary | ICD-10-CM

## 2024-12-04 DIAGNOSIS — I129 Hypertensive chronic kidney disease with stage 1 through stage 4 chronic kidney disease, or unspecified chronic kidney disease: Secondary | ICD-10-CM | POA: Diagnosis present

## 2024-12-04 DIAGNOSIS — Z1152 Encounter for screening for COVID-19: Secondary | ICD-10-CM | POA: Diagnosis not present

## 2024-12-04 DIAGNOSIS — N179 Acute kidney failure, unspecified: Secondary | ICD-10-CM

## 2024-12-04 DIAGNOSIS — G9341 Metabolic encephalopathy: Secondary | ICD-10-CM | POA: Diagnosis present

## 2024-12-04 DIAGNOSIS — Z8249 Family history of ischemic heart disease and other diseases of the circulatory system: Secondary | ICD-10-CM | POA: Diagnosis not present

## 2024-12-04 DIAGNOSIS — N390 Urinary tract infection, site not specified: Secondary | ICD-10-CM | POA: Diagnosis present

## 2024-12-04 DIAGNOSIS — Z87891 Personal history of nicotine dependence: Secondary | ICD-10-CM | POA: Diagnosis not present

## 2024-12-04 DIAGNOSIS — Z79899 Other long term (current) drug therapy: Secondary | ICD-10-CM | POA: Diagnosis not present

## 2024-12-04 DIAGNOSIS — E7800 Pure hypercholesterolemia, unspecified: Secondary | ICD-10-CM | POA: Diagnosis present

## 2024-12-04 DIAGNOSIS — N170 Acute kidney failure with tubular necrosis: Secondary | ICD-10-CM | POA: Diagnosis not present

## 2024-12-04 DIAGNOSIS — E669 Obesity, unspecified: Secondary | ICD-10-CM | POA: Diagnosis present

## 2024-12-04 DIAGNOSIS — E039 Hypothyroidism, unspecified: Secondary | ICD-10-CM | POA: Diagnosis present

## 2024-12-04 DIAGNOSIS — G9340 Encephalopathy, unspecified: Secondary | ICD-10-CM | POA: Diagnosis not present

## 2024-12-04 DIAGNOSIS — Z823 Family history of stroke: Secondary | ICD-10-CM | POA: Diagnosis not present

## 2024-12-04 DIAGNOSIS — E860 Dehydration: Secondary | ICD-10-CM | POA: Diagnosis present

## 2024-12-04 DIAGNOSIS — E873 Alkalosis: Secondary | ICD-10-CM | POA: Diagnosis present

## 2024-12-04 DIAGNOSIS — E871 Hypo-osmolality and hyponatremia: Secondary | ICD-10-CM | POA: Diagnosis present

## 2024-12-04 DIAGNOSIS — F32A Depression, unspecified: Secondary | ICD-10-CM | POA: Diagnosis present

## 2024-12-04 DIAGNOSIS — B952 Enterococcus as the cause of diseases classified elsewhere: Secondary | ICD-10-CM | POA: Diagnosis present

## 2024-12-04 DIAGNOSIS — G35D Multiple sclerosis, unspecified: Secondary | ICD-10-CM

## 2024-12-04 DIAGNOSIS — R410 Disorientation, unspecified: Secondary | ICD-10-CM | POA: Diagnosis not present

## 2024-12-04 DIAGNOSIS — N3000 Acute cystitis without hematuria: Secondary | ICD-10-CM | POA: Diagnosis present

## 2024-12-04 DIAGNOSIS — Z7983 Long term (current) use of bisphosphonates: Secondary | ICD-10-CM | POA: Diagnosis not present

## 2024-12-04 DIAGNOSIS — Z7401 Bed confinement status: Secondary | ICD-10-CM | POA: Diagnosis not present

## 2024-12-04 DIAGNOSIS — N1831 Chronic kidney disease, stage 3a: Secondary | ICD-10-CM | POA: Diagnosis present

## 2024-12-04 DIAGNOSIS — Z993 Dependence on wheelchair: Secondary | ICD-10-CM | POA: Diagnosis not present

## 2024-12-04 DIAGNOSIS — Z833 Family history of diabetes mellitus: Secondary | ICD-10-CM | POA: Diagnosis not present

## 2024-12-04 DIAGNOSIS — Z79890 Hormone replacement therapy (postmenopausal): Secondary | ICD-10-CM | POA: Diagnosis not present

## 2024-12-04 LAB — CBC
HCT: 36.4 % (ref 36.0–46.0)
Hemoglobin: 12.1 g/dL (ref 12.0–15.0)
MCH: 31.1 pg (ref 26.0–34.0)
MCHC: 33.2 g/dL (ref 30.0–36.0)
MCV: 93.6 fL (ref 80.0–100.0)
Platelets: 285 K/uL (ref 150–400)
RBC: 3.89 MIL/uL (ref 3.87–5.11)
RDW: 12.6 % (ref 11.5–15.5)
WBC: 8.8 K/uL (ref 4.0–10.5)
nRBC: 0 % (ref 0.0–0.2)

## 2024-12-04 LAB — BASIC METABOLIC PANEL WITH GFR
Anion gap: 11 (ref 5–15)
BUN: 21 mg/dL (ref 8–23)
CO2: 23 mmol/L (ref 22–32)
Calcium: 8.3 mg/dL — ABNORMAL LOW (ref 8.9–10.3)
Chloride: 102 mmol/L (ref 98–111)
Creatinine, Ser: 1.18 mg/dL — ABNORMAL HIGH (ref 0.44–1.00)
GFR, Estimated: 50 mL/min — ABNORMAL LOW
Glucose, Bld: 82 mg/dL (ref 70–99)
Potassium: 4.9 mmol/L (ref 3.5–5.1)
Sodium: 135 mmol/L (ref 135–145)

## 2024-12-04 LAB — MAGNESIUM: Magnesium: 2.1 mg/dL (ref 1.7–2.4)

## 2024-12-04 LAB — PHOSPHORUS: Phosphorus: 3.9 mg/dL (ref 2.5–4.6)

## 2024-12-04 MED ORDER — SODIUM CHLORIDE 0.9 % IV SOLN
1.0000 g | INTRAVENOUS | Status: DC
  Administered 2024-12-04 – 2024-12-05 (×2): 1 g via INTRAVENOUS
  Filled 2024-12-04 (×2): qty 10

## 2024-12-04 MED ORDER — HYDROMORPHONE HCL 1 MG/ML IJ SOLN
0.5000 mg | INTRAMUSCULAR | Status: DC | PRN
  Administered 2024-12-04: 0.5 mg via INTRAVENOUS
  Filled 2024-12-04: qty 1

## 2024-12-04 MED ORDER — METHOCARBAMOL 1000 MG/10ML IJ SOLN
1000.0000 mg | Freq: Once | INTRAMUSCULAR | Status: AC
  Administered 2024-12-04: 1000 mg via INTRAVENOUS
  Filled 2024-12-04: qty 10

## 2024-12-04 MED ORDER — HYDROMORPHONE HCL 1 MG/ML IJ SOLN: 0.5000 mg | Freq: Four times a day (QID) | INTRAMUSCULAR | Status: DC | PRN

## 2024-12-04 MED ORDER — LORAZEPAM 2 MG/ML IJ SOLN
1.0000 mg | Freq: Once | INTRAMUSCULAR | Status: AC
  Administered 2024-12-04: 1 mg via INTRAVENOUS
  Filled 2024-12-04: qty 1

## 2024-12-04 MED ORDER — HYDROMORPHONE HCL 1 MG/ML IJ SOLN
INTRAMUSCULAR | Status: AC
  Administered 2024-12-04: 0.5 mg via INTRAVENOUS
  Filled 2024-12-04: qty 1

## 2024-12-04 NOTE — Progress Notes (Signed)
 Patient arrived to the floor. Pt is asleep with periods of apnea. O2 sats 100% with 3L [place]. Patient responds to voice but is not able to answer any orientation questions at this time. MD aware. Patient shows no signs of distress.

## 2024-12-04 NOTE — Progress Notes (Signed)
 "                                                                                                                                                                                                                                                                                PROGRESS NOTE     Patient Demographics:    Mary Villanueva, is a 69 y.o. female, DOB - 12-12-1955, FMW:[id number]  Outpatient Primary MD for the patient is Larnell Hamilton, MD    LOS - 0  Admit date - 12/03/2024    Chief Complaint  Patient presents with   Altered Mental Status       Brief Narrative (HPI from H&P)     Mary Villanueva is a 69 y.o. female with medical history significant for multiple sclerosis wheelchair-bound, history of MS for 40 years initially stable however progressively getting worse over last 10 years, chronic BLE weakness, gait abnormality, impaired mobility, spasticity, depression, anemia, anxiety, overactive bladder, CKD stage IIIa, hypothyroidism, and hypertension who presented via EMS from SNF for evaluation of altered mental status. Per EMS, since last evening, patient has been screaming in pain and has not been responding to staff as usual. They have attempted to give her pain medications and Ativan  to calm her down without any improvement in her mental status so EMS was called for evaluation. On EMS arrival, patient was noted to have decreased respiration to around 6 and 7 with a period of apnea. 2 mg of Narcan administered with improvement in respiration.  At the bedside, patient constantly screaming but has a few moments of calmness. Per sister, patient has a foul-smelling urine and seems to be in pain.  Reports that patient is usually alert and oriented x 3 at baseline.   ED Course: Initial vitals show RR 16-24, HR 90-100s, SBP 98-130s, SpO2 99% on room air. Initial labs significant for creatinine 1.31, bicarb 21, sodium 134, normal CBC, INR, proBNP, lactic acid, ammonia, negative COVID/RSV/flu  test, unremarkable VBG, UA shows negative nitrite, large leuks, WBC 0-5 and few bacteria. EKG shows sinus rhythm. CXR shows no acute disease. CT A/P with no acute intra-abdominal pathology. CT head with no acute intracranial abnormality. CTA  chest PE study negative for PE.  Pt received IV LR 1 L bolus and IV Rocephin . TRH was consulted for admission.      Subjective:   No acute events overnight, patient remains confused.  Unable to offer any complaints.     Assessment  & Plan :   Assessment & Plan Acute encephalopathy  Disorientation  Generalized weakness  History of multiple sclerosis  Acute renal failure superimposed on stage 3a chronic kidney disease (HCC)  Mary Villanueva is a 69 y.o. female with medical history significant for multiple sclerosis wheelchair-bound, history of MS for 40 years initially stable however progressively getting worse over last 10 years, chronic BLE weakness, gait abnormality, impaired mobility, spasticity, depression, anemia, anxiety, overactive bladder, CKD stage IIIa, hypothyroidism, and hypertension who presented via EMS from SNF for evaluation of altered mental status and admitted for acute encephalopathy and UTI.   # Acute encephalopathy Patient with history of MS presented from SNF due to altered mental status, screaming and not responding as usual. Workup with labs and imaging overall nonrevealing but shows possible UTI. Patient with continued screaming and agitation in the ED, resolved after receiving IV Toradol  and IV Dilaudid  for pain.  CT head without any intracranial abnormalities.  Ammonia levels, LFTs within normal limits, troponin flat. -Hold pain medications, benzos - Treatment of UTI ongoing. - Delirium precautions   # Acute cystitis - Pt with hx of UTIs presented with altered mental status, and foul-smelling and cloudy urine - Urinalysis shows possible signs of infection - Continue empiric treatment with IV rocephin  - Blood cultures  x 2 and urine culture pending   # AKI on CKD 3A Creatinine slightly elevated to 1.5 on admission, now back closer to baseline of 1.0-1.2. Likely secondary to mild dehydration - Continue IVLR at 150 cc/hr - Trend renal function and avoid nephrotoxic meds   # HTN - BP soft, will hold medications for now   # Generalized weakness # Impaired mobility/gait abnormality - In the setting of her MS and acute urinary infection - PT/OT eval and treat - Fall precautions   Chronic medical problems # Hypothyroidism # Overactive bladder # Multiple sclerosis # Anxiety and depression - Resume meds after completion of full med rec and improvement in mental status   Patient Lines/Drains/Airways Status     Active Line/Drains/Airways     Name Placement date Placement time Site Days   Peripheral IV 10/29/24 Anterior;Right Forearm 10/29/24  1807  Forearm  36   Peripheral IV 12/03/24 18 G Left Antecubital 12/03/24  2049  Antecubital  1   Urethral Catheter Waddell C.,RN Temperature probe 16 Fr. 12/03/24  2100  Temperature probe  1              Nutrition Problem:        Obesity: Estimated body mass index is 28.32 kg/m as calculated from the following:   Height as of 10/30/24: 5' (1.524 m).   Weight as of 10/29/24: 65.8 kg.          Condition - Extremely Guarded   Code Status : Full code      Disposition Plan  :    Status is: Inpatient   DVT Prophylaxis  :    enoxaparin  (LOVENOX ) injection 30 mg Start: 12/04/24 1000    Lab Results  Component Value Date   PLT 285 12/04/2024    Diet :  Diet Order             Diet NPO  time specified  Diet effective now                    Inpatient Medications  Scheduled Meds:  enoxaparin  (LOVENOX ) injection  30 mg Subcutaneous Q24H   Continuous Infusions:  cefTRIAXone  (ROCEPHIN )  IV     lactated ringers  150 mL/hr at 12/04/24 0746   PRN Meds:.acetaminophen  **OR** acetaminophen , bisacodyl , ondansetron  **OR**  ondansetron  (ZOFRAN ) IV, senna-docusate  Antibiotics  :    Anti-infectives (From admission, onward)    Start     Dose/Rate Route Frequency Ordered Stop   12/04/24 2200  cefTRIAXone  (ROCEPHIN ) 1 g in sodium chloride  0.9 % 100 mL IVPB        1 g 200 mL/hr over 30 Minutes Intravenous Every 24 hours 12/04/24 0148     12/03/24 2230  cefTRIAXone  (ROCEPHIN ) 2 g in sodium chloride  0.9 % 100 mL IVPB        2 g 200 mL/hr over 30 Minutes Intravenous  Once 12/03/24 2217 12/03/24 2311         Objective:   Vitals:   12/04/24 0500 12/04/24 0622 12/04/24 0846 12/04/24 1642  BP: (!) 121/56  137/62 (!) 118/46  Pulse: 85  95 96  Resp: (!) 6  10 18   Temp:  97.6 F (36.4 C) 99.5 F (37.5 C) 99.3 F (37.4 C)  TempSrc:  Axillary Rectal   SpO2: 100%  100% 99%    Wt Readings from Last 3 Encounters:  10/29/24 65.8 kg  10/28/24 65.8 kg  08/01/20 66 kg     Intake/Output Summary (Last 24 hours) at 12/04/2024 1803 Last data filed at 12/04/2024 0016 Gross per 24 hour  Intake 1100 ml  Output 1300 ml  Net -200 ml     Physical Exam  General: Elderly woman laying in bed restless HEENT: [place]/AT. Anicteric sclera CV: RRR. No murmurs, rubs, or gallops. No LE edema Pulmonary: Lungs CTAB. Normal effort. No wheezing or rales. Abdominal: Soft, nontender, nondistended. Normal bowel sounds. Extremities: Palpable radial and DP pulses. Normal ROM. Skin: Warm and dry. No obvious rash or lesions. Neuro: Altered ,Does not follow any commands or answers any questions. Contraction of the right lower extremity.  Good movement of the upper extremities, minimal use of the lower extremities. Psych: Restless    Data Review:    Recent Labs  Lab 12/03/24 2016 12/03/24 2027 12/04/24 0530  WBC 8.1  --  8.8  HGB 13.2 12.9  13.6 12.1  HCT 39.2 38.0  40.0 36.4  PLT 326  --  285  MCV 92.5  --  93.6  MCH 31.1  --  31.1  MCHC 33.7  --  33.2  RDW 12.6  --  12.6  LYMPHSABS 1.1  --   --   MONOABS 0.8  --   --    EOSABS 0.1  --   --   BASOSABS 0.1  --   --     Recent Labs  Lab 12/03/24 2016 12/03/24 2018 12/03/24 2027 12/03/24 2255 12/04/24 0530  NA 134*  --  135  136  --  135  K 4.8  --  4.8  4.8  --  4.9  CL 99  --  101  --  102  CO2 21*  --   --   --  23  ANIONGAP 14  --   --   --  11  GLUCOSE 81  --  82  --  82  BUN 23  --  27*  --  21  CREATININE 1.31*  --  1.50*  --  1.18*  AST 32  --   --   --   --   ALT 24  --   --   --   --   ALKPHOS 73  --   --   --   --   BILITOT 0.4  --   --   --   --   ALBUMIN 3.7  --   --   --   --   LATICACIDVEN  --   --  1.2 1.3  --   INR 1.0  --   --   --   --   AMMONIA  --  23  --   --   --   MG  --   --   --   --  2.1  PHOS  --   --   --   --  3.9  CALCIUM  9.0  --   --   --  8.3*      Recent Labs  Lab 12/03/24 2016 12/03/24 2018 12/03/24 2027 12/03/24 2255 12/04/24 0530  LATICACIDVEN  --   --  1.2 1.3  --   INR 1.0  --   --   --   --   AMMONIA  --  23  --   --   --   MG  --   --   --   --  2.1  CALCIUM  9.0  --   --   --  8.3*    --------------------------------------------------------------------------------------------------------------- No results found for: CHOL, HDL, LDLCALC, LDLDIRECT, TRIG, CHOLHDL  Lab Results  Component Value Date   HGBA1C 5.6 08/02/2020   No results for input(s): TSH, T4TOTAL, FREET4, T3FREE, THYROIDAB in the last 72 hours. No results for input(s): VITAMINB12, FOLATE, FERRITIN, TIBC, IRON, RETICCTPCT in the last 72 hours. ------------------------------------------------------------------------------------------------------------------ Cardiac Enzymes No results for input(s): CKMB, TROPONINI, MYOGLOBIN in the last 168 hours.  Invalid input(s): CK  Micro Results Recent Results (from the past 240 hours)  Blood Culture (routine x 2)     Status: None (Preliminary result)   Collection Time: 12/03/24  8:16 PM   Specimen: BLOOD  Result Value Ref Range Status    Specimen Description BLOOD RIGHT ANTECUBITAL  Final   Special Requests   Final    BOTTLES DRAWN AEROBIC AND ANAEROBIC Blood Culture adequate volume   Culture   Final    NO GROWTH < 12 HOURS Performed at [facility], [address], [place], [place] [address]    Report Status PENDING  Incomplete  Resp panel by RT-PCR (RSV, Flu A&B, Covid)     Status: None   Collection Time: 12/03/24  8:17 PM   Specimen: Nasal Swab  Result Value Ref Range Status   SARS Coronavirus 2 by RT PCR NEGATIVE NEGATIVE Final   Influenza A by PCR NEGATIVE NEGATIVE Final   Influenza B by PCR NEGATIVE NEGATIVE Final    Comment: (NOTE) The Xpert Xpress SARS-CoV-2/FLU/RSV plus assay is intended as an aid in the diagnosis of influenza from Nasopharyngeal swab specimens and should not be used as a sole basis for treatment. Nasal washings and aspirates are unacceptable for Xpert Xpress SARS-CoV-2/FLU/RSV testing.  Fact Sheet for Patients: bloggercourse.com  Fact Sheet for Healthcare Providers: seriousbroker.it  This test is not yet approved or cleared by the [place]  FDA and has been authorized for detection and/or diagnosis of SARS-CoV-2 by FDA under an  Emergency Use Authorization (EUA). This EUA will remain in effect (meaning this test can be used) for the duration of the COVID-19 declaration under Section 564(b)(1) of the Act, 21 U.S.C. section 360bbb-3(b)(1), unless the authorization is terminated or revoked.     Resp Syncytial Virus by PCR NEGATIVE NEGATIVE Final    Comment: (NOTE) Fact Sheet for Patients: bloggercourse.com  Fact Sheet for Healthcare Providers: seriousbroker.it  This test is not yet approved or cleared by the [place]  FDA and has been authorized for detection and/or diagnosis of SARS-CoV-2 by FDA under an Emergency Use Authorization (EUA). This EUA will remain in  effect (meaning this test can be used) for the duration of the COVID-19 declaration under Section 564(b)(1) of the Act, 21 U.S.C. section 360bbb-3(b)(1), unless the authorization is terminated or revoked.  Performed at [facility], [address] [place], [place] [address]   Blood Culture (routine x 2)     Status: None (Preliminary result)   Collection Time: 12/03/24  9:29 PM   Specimen: BLOOD RIGHT HAND  Result Value Ref Range Status   Specimen Description BLOOD RIGHT HAND  Final   Special Requests   Final    BOTTLES DRAWN AEROBIC AND ANAEROBIC Blood Culture adequate volume   Culture   Final    NO GROWTH < 12 HOURS Performed at [facility], [address], [place], [place] [address]    Report Status PENDING  Incomplete    Radiology Report CT ABDOMEN PELVIS W CONTRAST Result Date: 12/03/2024 EXAM: CT ABDOMEN AND PELVIS WITH CONTRAST 12/03/2024 09:17:00 PM TECHNIQUE: CT of the abdomen and pelvis was performed with the administration of 65 mL of iohexol  (OMNIPAQUE ) 350 MG/ML injection. Multiplanar reformatted images are provided for review. Automated exposure control, iterative reconstruction, and/or weight-based adjustment of the mA/kV was utilized to reduce the radiation dose to as low as reasonably achievable. COMPARISON: None available. CLINICAL HISTORY: Abdominal pain, acute, nonlocalized. FINDINGS: LOWER CHEST: No acute abnormality. LIVER: The liver is unremarkable. GALLBLADDER AND BILE DUCTS: Gallbladder is unremarkable. No biliary ductal dilatation. SPLEEN: No acute abnormality. PANCREAS: No acute abnormality. ADRENAL GLANDS: No acute abnormality. KIDNEYS, URETERS AND BLADDER: Subcentimeter hypodensity within the right kidney, too small to characterize - no further follow-up indicated. No stones in the kidneys or ureters. No hydronephrosis. No perinephric or periureteral stranding. Lobulated urinary contour with intramural calcified fibroids. Urinary bladder lumen  decompressed with foley catheter tip and balloon terminating within its lumen. GI AND BOWEL: Tiny hiatal hernia. Stomach demonstrates no acute abnormality. No small or large bowel thickening or dilatation. Colonic diverticulosis. The appendix is unremarkable. There is no bowel obstruction. PERITONEUM AND RETROPERITONEUM: No ascites. No free air. VASCULATURE: Moderate atherosclerotic plaque of the aorta. LYMPH NODES: No lymphadenopathy. REPRODUCTIVE ORGANS: No adnexal mass. BONES AND SOFT TISSUES: No acute osseous abnormality. No focal soft tissue abnormality. IMPRESSION: 1. No acute abnormality in the abdomen or pelvis. 2. Other, non-acute and/or normal findings as above. Electronically signed by: Morgane Naveau MD MD 12/03/2024 09:39 PM EST RP Workstation: [id number]   CT Angio Chest PE W and/or Wo Contrast Result Date: 12/03/2024 EXAM: CTA of the Chest without and with contrast for PE 12/03/2024 09:17:00 PM TECHNIQUE: CTA of the chest was performed after the administration of 65 mL of intravenous iohexol  (OMNIPAQUE ) 350 MG/ML injection. Multiplanar reformatted images are provided for review. MIP images are provided for review. Automated exposure control, iterative reconstruction, and/or weight based adjustment of the mA/kV was utilized to reduce the  radiation dose to as low as reasonably achievable. COMPARISON: None available. CLINICAL HISTORY: Pulmonary embolism (PE) suspected, high prob. FINDINGS: PULMONARY ARTERIES: Pulmonary arteries are adequately opacified for evaluation. No pulmonary embolus. Limited evaluation of the subsegmental level due to motion artifact. Main pulmonary artery is normal in caliber. MEDIASTINUM: The heart and pericardium demonstrate no acute abnormality. Left Anterior Descending Coronary Artery calcification. Mild atherosclerotic plaque. The thoracic aorta is normal in caliber. LYMPH NODES: No mediastinal, hilar or axillary lymphadenopathy. LUNGS AND PLEURA: Elevated right  hemidiaphragm with right middle lobe atelectasis. No focal consolidation or pulmonary edema. No pleural effusion or pneumothorax. UPPER ABDOMEN: Limited images of the upper abdomen demonstrate a tiny hiatal hernia. Otherwise, limited images of the upper abdomen are unremarkable. SOFT TISSUES AND BONES: Limited evaluation for acute rib fracture due to motion artifact. No acute soft tissue abnormality. IMPRESSION: 1. No pulmonary embolism, with limited evaluation at the subsegmental level due to motion artifact. 2. No acute findings. 3. Other, non-acute and/or normal findings as above. Electronically signed by: Morgane Naveau MD MD 12/03/2024 09:32 PM EST RP Workstation: [id number]   CT Head Wo Contrast Result Date: 12/03/2024 EXAM: CT HEAD WITHOUT CONTRAST 12/03/2024 09:17:00 PM TECHNIQUE: CT of the head was performed without the administration of intravenous contrast. Automated exposure control, iterative reconstruction, and/or weight based adjustment of the mA/kV was utilized to reduce the radiation dose to as low as reasonably achievable. COMPARISON: CT head 08/01/2020. CLINICAL HISTORY: Mental status change, unknown cause. FINDINGS: BRAIN AND VENTRICLES: No acute hemorrhage. No evidence of acute infarct. No hydrocephalus. No extra-axial collection. No mass effect or midline shift. Cerebral ventricle sizes are concordant with the degree of cerebral volume loss. Periventricular white matter changes, likely sequela of chronic small vessel ischemic disease. ORBITS: No acute abnormality. SINUSES: No acute abnormality. SOFT TISSUES AND SKULL: No acute soft tissue abnormality. No skull fracture. IMPRESSION: 1. No acute intracranial abnormality. Electronically signed by: Morgane Naveau MD MD 12/03/2024 09:27 PM EST RP Workstation: [id number]   DG Chest Portable 1 View Result Date: 12/03/2024 EXAM: 1 VIEW(S) XRAY OF THE CHEST 12/03/2024 09:02:00 PM COMPARISON: Chest x-ray 10/28/2024, CT angio chest 12/03/2024.  CLINICAL HISTORY: Questionable sepsis - evaluate for abnormality. FINDINGS: LUNGS AND PLEURA: Elevated right hemidiaphragm. Right lung base atelectasis. No pleural effusion. No pneumothorax. HEART AND MEDIASTINUM: Aortic atherosclerosis. BONES AND SOFT TISSUES: Bowel beneath right hemidiaphragm. No acute osseous abnormality. IMPRESSION: 1. No acute findings. Electronically signed by: Morgane Naveau MD MD 12/03/2024 09:20 PM EST RP Workstation: [id number]     Signature  -   Moise FALCON Terrika Zuver M.D on 12/04/2024 at 6:03 PM   -  To page go to [email or web address]   "

## 2024-12-04 NOTE — Care Management Obs Status (Signed)
 MEDICARE OBSERVATION STATUS NOTIFICATION   Patient Details  Name: Mary Villanueva MRN: [id number] Date of Birth: [birth year]   Medicare Observation Status Notification Given:  Yes    Robynn Eileen Hoose, RN 12/04/2024, 4:15 PM

## 2024-12-04 NOTE — ED Notes (Signed)
 Pt screaming out and having episodes of dry heaving/ coughing . Provider notified and came to bedside. Provider gave verbal order to change dilaudid  to Q4 and added Robaxin . Verbal order, ok to give dilaudid  early if needed and vitals are stable.   Zofran  and Robaxin  administered as ordered.

## 2024-12-04 NOTE — Evaluation (Signed)
 Physical Therapy Evaluation Patient Details Name: Mary Villanueva MRN: [id number] DOB: [birth year] Today's Date: 12/04/2024  History of Present Illness  Pt is a 69 y.o. female admitted 1/9 with acute encephalopathy. PMH:  MS for 40 years initially stable however progressively getting worse over last 10 years, chronic BLE weakness, spasticity, depression, anemia, anxiety, overactive bladder, CKD stage IIIa, hypothyroidism, HTN   Clinical Impression  Pt admitted with above diagnosis. PTA pt was at [facility] for rehab following 10/2024 hospitalization. Pt currently with functional limitations due to the deficits listed below (see PT Problem List). On eval, pt required total assist bed mobility. Unable to transition to EOB due to trunk rigidity, heavy push into ext. Pt preferring semi fetal position in bed in R sidelying, with LLE in extension and RLE in flexion. Good eye contact but no verbal interaction with therapist. Moaning in response to pain with ROM and mobility attempts. VSS on 3L. Pt will benefit from acute skilled PT to increase their independence and safety with mobility to allow discharge. Post acute, recommend further therapy in inpatient setting < 3 hours/day.          If plan is discharge home, recommend the following: Two people to help with walking and/or transfers;Two people to help with bathing/dressing/bathroom   Can travel by private vehicle   No    Equipment Recommendations Hospital bed;Hoyer lift  Recommendations for Other Services       Functional Status Assessment Patient has had a recent decline in their functional status and demonstrates the ability to make significant improvements in function in a reasonable and predictable amount of time.     Precautions / Restrictions Precautions Precautions: Fall Recall of Precautions/Restrictions: Impaired      Mobility  Bed Mobility Overal bed mobility: Needs Assistance Bed Mobility: Rolling Rolling:  Total assist         General bed mobility comments: Unsuccessful attempt to transition to EOB, rigid trunk with heavy push into extension    Transfers                        Ambulation/Gait                  Stairs            Wheelchair Mobility     Tilt Bed    Modified Rankin (Stroke Patients Only)       Balance                                             Pertinent Vitals/Pain Pain Assessment Pain Assessment: Faces Faces Pain Scale: Hurts even more Pain Location: BUE/LE with attempts to range or mobilize Pain Descriptors / Indicators: Grimacing, Guarding, Moaning Pain Intervention(s): Monitored during session, Repositioned    Home Living Family/patient expects to be discharged to:: Skilled nursing facility                   Additional Comments: Pt has been at [facility] since 10/20[facility] hospitalization. Prior to that time she lived at home with her brother.    Prior Function Prior Level of Function : Needs assist             Mobility Comments: Mod I at w/c level prior to 10/2024 hospitalization. ADLs Comments: mod I basic ADLs prior to 10/2024 hospitalization  Extremity/Trunk Assessment   Upper Extremity Assessment Upper Extremity Assessment: Defer to OT evaluation    Lower Extremity Assessment Lower Extremity Assessment: RLE deficits/detail;LLE deficits/detail RLE Deficits / Details: spasticity/rigidity, RLE in flexed position at hip and knee, tolerated small amount of low load prolonged stretch into ext but without significant ROM gains LLE Deficits / Details: spasticity/rigidity, LLE in full knee extension, fixed foot drop    Cervical / Trunk Assessment Cervical / Trunk Assessment: Kyphotic  Communication   Communication Communication: Impaired Factors Affecting Communication: Difficulty expressing self    Cognition Arousal: Alert Behavior During Therapy: Flat affect   PT -  Cognitive impairments: Difficult to assess Difficult to assess due to: Impaired communication                     PT - Cognition Comments: Good eye contact. No verbal interaction except moaning in response to pain. Following commands: Impaired (not following commands)       Cueing       General Comments General comments (skin integrity, edema, etc.): VSS on 3L    Exercises     Assessment/Plan    PT Assessment Patient needs continued PT services  PT Problem List Decreased strength;Decreased mobility;Impaired tone;Decreased range of motion;Decreased activity tolerance;Decreased cognition;Pain;Decreased balance;Decreased knowledge of precautions       PT Treatment Interventions      PT Goals (Current goals can be found in the Care Plan section)  Acute Rehab PT Goals Patient Stated Goal: unable to state PT Goal Formulation: With family Time For Goal Achievement: 12/18/24 Potential to Achieve Goals: Fair    Frequency Min 2X/week     Co-evaluation               AM-PAC PT 6 Clicks Mobility  Outcome Measure Help needed turning from your back to your side while in a flat bed without using bedrails?: Total Help needed moving from lying on your back to sitting on the side of a flat bed without using bedrails?: Total Help needed moving to and from a bed to a chair (including a wheelchair)?: Total Help needed standing up from a chair using your arms (e.g., wheelchair or bedside chair)?: Total Help needed to walk in hospital room?: Total Help needed climbing 3-5 steps with a railing? : Total 6 Click Score: 6    End of Session Equipment Utilized During Treatment: Oxygen Activity Tolerance: Patient limited by pain Patient left: in bed;with family/visitor present;with call bell/phone within reach;with bed alarm set Nurse Communication: Mobility status PT Visit Diagnosis: Other abnormalities of gait and mobility (R26.89);Muscle weakness (generalized)  (M62.81);Pain    Time: 8748-8687 PT Time Calculation (min) (ACUTE ONLY): 21 min   Charges:   PT Evaluation $PT Eval Moderate Complexity: 1 Mod   PT General Charges $$ ACUTE PT VISIT: 1 Visit         Sari MATSU., PT  Office # [phone]   Erven Sari Shaker 12/04/2024, 1:43 PM

## 2024-12-04 NOTE — ED Notes (Signed)
 Patient was given Ativan  1 mg IV at 0842, 5 minutes after receive the medication began having period of apnea and snoring. She did maintain oxygen at 98 to 100 % on 4 liters nasal cannula, but he respiratory rate was 8 to 10. Patient was place on the monitor. Central monitor was made aware the patient need to be monitor and Dr Emilia was notify via chat. Md came to the bedside to assess the patient. All narcotics and Benzo was discontinue.

## 2024-12-05 LAB — BLOOD CULTURE ID PANEL (REFLEXED) - BCID2

## 2024-12-05 LAB — BASIC METABOLIC PANEL WITH GFR
Anion gap: 16 — ABNORMAL HIGH (ref 5–15)
BUN: 14 mg/dL (ref 8–23)
CO2: 20 mmol/L — ABNORMAL LOW (ref 22–32)
Calcium: 8.4 mg/dL — ABNORMAL LOW (ref 8.9–10.3)
Chloride: 104 mmol/L (ref 98–111)
Creatinine, Ser: 1.02 mg/dL — ABNORMAL HIGH (ref 0.44–1.00)
GFR, Estimated: 59 mL/min — ABNORMAL LOW
Glucose, Bld: 50 mg/dL — ABNORMAL LOW (ref 70–99)
Potassium: 4.5 mmol/L (ref 3.5–5.1)
Sodium: 139 mmol/L (ref 135–145)

## 2024-12-05 LAB — MAGNESIUM: Magnesium: 1.9 mg/dL (ref 1.7–2.4)

## 2024-12-05 MED ORDER — OXYCODONE HCL 5 MG PO TABS
5.0000 mg | ORAL_TABLET | ORAL | Status: DC | PRN
  Administered 2024-12-05: 5 mg via ORAL
  Filled 2024-12-05: qty 1

## 2024-12-05 MED ORDER — SODIUM CHLORIDE 0.9 % IV SOLN
3.0000 g | Freq: Three times a day (TID) | INTRAVENOUS | Status: DC
  Administered 2024-12-05: 3 g via INTRAVENOUS
  Filled 2024-12-05: qty 8

## 2024-12-05 MED ORDER — BACLOFEN 10 MG PO TABS
30.0000 mg | ORAL_TABLET | Freq: Three times a day (TID) | ORAL | Status: DC
  Administered 2024-12-05 – 2024-12-14 (×27): 30 mg via ORAL
  Filled 2024-12-05 (×27): qty 3

## 2024-12-05 NOTE — Progress Notes (Signed)
 PHARMACY - PHYSICIAN COMMUNICATION CRITICAL VALUE ALERT - BLOOD CULTURE IDENTIFICATION (BCID)  Mary Villanueva is an 69 y.o. female who presented to [facility] on 12/03/2024 with a chief complaint of AMS and UTI  Assessment:  1/2 blood cultures growing Staph species, likely contaminant  Name of physician (or Provider) Contacted:  Dr. Charlton  Current antibiotics:  Rocephin   Changes to prescribed antibiotics recommended:  No changes needed at this time   Results for orders placed or performed during the hospital encounter of 12/03/24  Blood Culture ID Panel (Reflexed) (Collected: 12/03/2024  8:16 PM)  Result Value Ref Range   Enterococcus faecalis NOT DETECTED NOT DETECTED   Enterococcus Faecium NOT DETECTED NOT DETECTED   Listeria monocytogenes NOT DETECTED NOT DETECTED   Staphylococcus species DETECTED (A) NOT DETECTED   Staphylococcus aureus (BCID) NOT DETECTED NOT DETECTED   Staphylococcus epidermidis NOT DETECTED NOT DETECTED   Staphylococcus lugdunensis NOT DETECTED NOT DETECTED   Streptococcus species NOT DETECTED NOT DETECTED   Streptococcus agalactiae NOT DETECTED NOT DETECTED   Streptococcus pneumoniae NOT DETECTED NOT DETECTED   Streptococcus pyogenes NOT DETECTED NOT DETECTED   A.calcoaceticus-baumannii NOT DETECTED NOT DETECTED   Bacteroides fragilis NOT DETECTED NOT DETECTED   Enterobacterales NOT DETECTED NOT DETECTED   Enterobacter cloacae complex NOT DETECTED NOT DETECTED   Escherichia coli NOT DETECTED NOT DETECTED   Klebsiella aerogenes NOT DETECTED NOT DETECTED   Klebsiella oxytoca NOT DETECTED NOT DETECTED   Klebsiella pneumoniae NOT DETECTED NOT DETECTED   Proteus species NOT DETECTED NOT DETECTED   Salmonella species NOT DETECTED NOT DETECTED   Serratia marcescens NOT DETECTED NOT DETECTED   Haemophilus influenzae NOT DETECTED NOT DETECTED   Neisseria meningitidis NOT DETECTED NOT DETECTED   Pseudomonas aeruginosa NOT DETECTED NOT DETECTED    Stenotrophomonas maltophilia NOT DETECTED NOT DETECTED   Candida albicans NOT DETECTED NOT DETECTED   Candida auris NOT DETECTED NOT DETECTED   Candida glabrata NOT DETECTED NOT DETECTED   Candida krusei NOT DETECTED NOT DETECTED   Candida parapsilosis NOT DETECTED NOT DETECTED   Candida tropicalis NOT DETECTED NOT DETECTED   Cryptococcus neoformans/gattii NOT DETECTED NOT DETECTED    Mary Villanueva 12/05/2024  4:03 AM

## 2024-12-05 NOTE — Plan of Care (Signed)

## 2024-12-05 NOTE — Progress Notes (Signed)
 "                                                                                                                                                                                                                                                                                PROGRESS NOTE     Patient Demographics:    Mary Villanueva, is a 69 y.o. female, DOB - June 16, 1956, FMW:[id number]  Outpatient Primary MD for the patient is Larnell Hamilton, MD    LOS - 1  Admit date - 12/03/2024    Chief Complaint  Patient presents with   Altered Mental Status       Brief Narrative (HPI from H&P)     Mary Villanueva is a 69 y.o. female with medical history significant for multiple sclerosis wheelchair-bound, history of MS for 40 years initially stable however progressively getting worse over last 10 years, chronic BLE weakness, gait abnormality, impaired mobility, spasticity, depression, anemia, anxiety, overactive bladder, CKD stage IIIa, hypothyroidism, and hypertension who presented via EMS from SNF for evaluation of altered mental status. Per EMS, since last evening, patient has been screaming in pain and has not been responding to staff as usual. They have attempted to give her pain medications and Ativan  to calm her down without any improvement in her mental status so EMS was called for evaluation. On EMS arrival, patient was noted to have decreased respiration to around 6 and 7 with a period of apnea. 2 mg of Narcan administered with improvement in respiration.  At the bedside, patient constantly screaming but has a few moments of calmness. Per sister, patient has a foul-smelling urine and seems to be in pain.  Reports that patient is usually alert and oriented x 3 at baseline.   ED Course: Initial vitals show RR 16-24, HR 90-100s, SBP 98-130s, SpO2 99% on room air. Initial labs significant for creatinine 1.31, bicarb 21, sodium 134, normal CBC, INR, proBNP, lactic acid, ammonia, negative COVID/RSV/flu  test, unremarkable VBG, UA shows negative nitrite, large leuks, WBC 0-5 and few bacteria. EKG shows sinus rhythm. CXR shows no acute disease. CT A/P with no acute intra-abdominal pathology. CT head with no acute intracranial abnormality. CTA  chest PE study negative for PE.  Pt received IV LR 1 L bolus and IV Rocephin . TRH was consulted for admission.      Subjective:   No acute events overnight, patient significantly more lucid today AxO x 4.  Family updated at bedside.    Assessment  & Plan :   Assessment & Plan Acute encephalopathy  Disorientation  Generalized weakness  History of multiple sclerosis  Acute renal failure superimposed on stage 3a chronic kidney disease (HCC)  Mary Villanueva is a 69 y.o. female with medical history significant for multiple sclerosis wheelchair-bound, history of MS for 40 years initially stable however progressively getting worse over last 10 years, chronic BLE weakness, gait abnormality, impaired mobility, spasticity, depression, anemia, anxiety, overactive bladder, CKD stage IIIa, hypothyroidism, and hypertension who presented via EMS from SNF for evaluation of altered mental status and admitted for acute encephalopathy and UTI.   # Acute encephalopathy, resolved #Central Apneic episodes  Patient with history of MS presented from SNF due to altered mental status, screaming and not responding as usual. Workup with labs and imaging overall nonrevealing but shows possible UTI. Patient with continued screaming and agitation in the ED, resolved after receiving IV Toradol  and IV Dilaudid  for pain.  CT head without any intracranial abnormalities.  Ammonia levels, LFTs within normal limits, troponin flat. - Treatment of UTI ongoing - Delirium precautions   # Acute cystitis - Pt with hx of UTIs presented with altered mental status, and foul-smelling and cloudy urine. Urinalysis shows possible signs of infection.  Blood cultures no growth to date.  Urine  culture with greater than 100 K colonies of Enterococcus, lactobacillus. - Continue empiric treatment with IV rocephin ; susceptibilities pending   # AKI on CKD 3A Creatinine slightly elevated to 1.5 on admission, now back closer to baseline of 1.0-1.2. Likely secondary to mild dehydration - Continue IVLR at 150 cc/hr - Trend renal function and avoid nephrotoxic meds   # HTN - BP soft, will hold medications for now   # Generalized weakness # Impaired mobility/gait abnormality - In the setting of her MS and acute urinary infection -Ordered baclofen  - PT/OT eval and treat - Fall precautions   Chronic medical problems # Hypothyroidism # Overactive bladder # Multiple sclerosis # Anxiety and depression   Patient Lines/Drains/Airways Status     Active Line/Drains/Airways     Name Placement date Placement time Site Days   Peripheral IV 10/29/24 Anterior;Right Forearm 10/29/24  1807  Forearm  37   Peripheral IV 12/03/24 18 G Left Antecubital 12/03/24  2049  Antecubital  2   Urethral Catheter Waddell C.,RN Temperature probe 16 Fr. 12/03/24  2100  Temperature probe  2              Nutrition Problem:        Obesity: Estimated body mass index is 28.32 kg/m as calculated from the following:   Height as of 10/30/24: 5' (1.524 m).   Weight as of 10/29/24: 65.8 kg.          Condition - Extremely Guarded   Code Status : Full code      Disposition Plan  :    Status is: Inpatient   DVT Prophylaxis  :    enoxaparin  (LOVENOX ) injection 30 mg Start: 12/04/24 1000    Lab Results  Component Value Date   PLT 285 12/04/2024    Diet :  Diet Order  Diet NPO time specified  Diet effective now                    Inpatient Medications  Scheduled Meds:  enoxaparin  (LOVENOX ) injection  30 mg Subcutaneous Q24H   Continuous Infusions:  cefTRIAXone  (ROCEPHIN )  IV 1 g (12/04/24 2216)   PRN Meds:.acetaminophen  **OR** acetaminophen , bisacodyl ,  ondansetron  **OR** ondansetron  (ZOFRAN ) IV, senna-docusate  Antibiotics  :    Anti-infectives (From admission, onward)    Start     Dose/Rate Route Frequency Ordered Stop   12/04/24 2200  cefTRIAXone  (ROCEPHIN ) 1 g in sodium chloride  0.9 % 100 mL IVPB        1 g 200 mL/hr over 30 Minutes Intravenous Every 24 hours 12/04/24 0148     12/03/24 2230  cefTRIAXone  (ROCEPHIN ) 2 g in sodium chloride  0.9 % 100 mL IVPB        2 g 200 mL/hr over 30 Minutes Intravenous  Once 12/03/24 2217 12/03/24 2311         Objective:   Vitals:   12/04/24 0846 12/04/24 1642 12/04/24 2100 12/05/24 0804  BP: 137/62 (!) 118/46 (!) 113/41 (!) 107/43  Pulse: 95 96 96 (!) 106  Resp: 10 18 19 18   Temp: 99.5 F (37.5 C) 99.3 F (37.4 C) 99.1 F (37.3 C) (!) 100.8 F (38.2 C)  TempSrc: Rectal   Oral  SpO2: 100% 99% 100% 100%    Wt Readings from Last 3 Encounters:  10/29/24 65.8 kg  10/28/24 65.8 kg  08/01/20 66 kg     Intake/Output Summary (Last 24 hours) at 12/05/2024 0957 Last data filed at 12/05/2024 0400 Gross per 24 hour  Intake --  Output 850 ml  Net -850 ml     Physical Exam  General: Elderly woman laying in bed restless HEENT: [place]/AT. Anicteric sclera CV: RRR. No murmurs, rubs, or gallops. No LE edema Pulmonary: Lungs CTAB. Normal effort. No wheezing or rales. Abdominal: Soft, nontender, nondistended. Normal bowel sounds. Extremities: Palpable radial and DP pulses. Normal ROM. Skin: Warm and dry. No obvious rash or lesions. Neuro: Altered ,Does not follow any commands or answers any questions. Contraction of the right lower extremity.  Good movement of the upper extremities, minimal use of the lower extremities. Psych: Restless    Data Review:    Recent Labs  Lab 12/03/24 2016 12/03/24 2027 12/04/24 0530  WBC 8.1  --  8.8  HGB 13.2 12.9  13.6 12.1  HCT 39.2 38.0  40.0 36.4  PLT 326  --  285  MCV 92.5  --  93.6  MCH 31.1  --  31.1  MCHC 33.7  --  33.2  RDW 12.6  --   12.6  LYMPHSABS 1.1  --   --   MONOABS 0.8  --   --   EOSABS 0.1  --   --   BASOSABS 0.1  --   --     Recent Labs  Lab 12/03/24 2016 12/03/24 2018 12/03/24 2027 12/03/24 2255 12/04/24 0530  NA 134*  --  135  136  --  135  K 4.8  --  4.8  4.8  --  4.9  CL 99  --  101  --  102  CO2 21*  --   --   --  23  ANIONGAP 14  --   --   --  11  GLUCOSE 81  --  82  --  82  BUN 23  --  27*  --  21  CREATININE 1.31*  --  1.50*  --  1.18*  AST 32  --   --   --   --   ALT 24  --   --   --   --   ALKPHOS 73  --   --   --   --   BILITOT 0.4  --   --   --   --   ALBUMIN 3.7  --   --   --   --   LATICACIDVEN  --   --  1.2 1.3  --   INR 1.0  --   --   --   --   AMMONIA  --  23  --   --   --   MG  --   --   --   --  2.1  PHOS  --   --   --   --  3.9  CALCIUM  9.0  --   --   --  8.3*      Recent Labs  Lab 12/03/24 2016 12/03/24 2018 12/03/24 2027 12/03/24 2255 12/04/24 0530  LATICACIDVEN  --   --  1.2 1.3  --   INR 1.0  --   --   --   --   AMMONIA  --  23  --   --   --   MG  --   --   --   --  2.1  CALCIUM  9.0  --   --   --  8.3*    --------------------------------------------------------------------------------------------------------------- No results found for: CHOL, HDL, LDLCALC, LDLDIRECT, TRIG, CHOLHDL  Lab Results  Component Value Date   HGBA1C 5.6 08/02/2020   No results for input(s): TSH, T4TOTAL, FREET4, T3FREE, THYROIDAB in the last 72 hours. No results for input(s): VITAMINB12, FOLATE, FERRITIN, TIBC, IRON, RETICCTPCT in the last 72 hours. ------------------------------------------------------------------------------------------------------------------ Cardiac Enzymes No results for input(s): CKMB, TROPONINI, MYOGLOBIN in the last 168 hours.  Invalid input(s): CK  Micro Results Recent Results (from the past 240 hours)  Blood Culture (routine x 2)     Status: None (Preliminary result)   Collection Time: 12/03/24  8:16  PM   Specimen: BLOOD  Result Value Ref Range Status   Specimen Description BLOOD RIGHT ANTECUBITAL  Final   Special Requests   Final    BOTTLES DRAWN AEROBIC AND ANAEROBIC Blood Culture adequate volume   Culture  Setup Time   Final    GRAM POSITIVE COCCI AEROBIC BOTTLE ONLY CRITICAL RESULT CALLED TO, READ BACK BY AND VERIFIED WITH: PHARMD G ABBOTT 12/05/2024 @ 0400 BY AB Performed at [facility], [address], [place], [place] [address]    Culture GRAM POSITIVE COCCI  Final   Report Status PENDING  Incomplete  Blood Culture ID Panel (Reflexed)     Status: Abnormal   Collection Time: 12/03/24  8:16 PM  Result Value Ref Range Status   Enterococcus faecalis NOT DETECTED NOT DETECTED Final   Enterococcus Faecium NOT DETECTED NOT DETECTED Final   Listeria monocytogenes NOT DETECTED NOT DETECTED Final   Staphylococcus species DETECTED (A) NOT DETECTED Final    Comment: CRITICAL RESULT CALLED TO, READ BACK BY AND VERIFIED WITH: PHARMD G ABBOTT 12/05/2024 @ 0400 BY AB    Staphylococcus aureus (BCID) NOT DETECTED NOT DETECTED Final   Staphylococcus epidermidis NOT DETECTED NOT DETECTED Final   Staphylococcus lugdunensis NOT DETECTED NOT DETECTED Final   Streptococcus species NOT DETECTED NOT DETECTED Final   Streptococcus  agalactiae NOT DETECTED NOT DETECTED Final   Streptococcus pneumoniae NOT DETECTED NOT DETECTED Final   Streptococcus pyogenes NOT DETECTED NOT DETECTED Final   A.calcoaceticus-baumannii NOT DETECTED NOT DETECTED Final   Bacteroides fragilis NOT DETECTED NOT DETECTED Final   Enterobacterales NOT DETECTED NOT DETECTED Final   Enterobacter cloacae complex NOT DETECTED NOT DETECTED Final   Escherichia coli NOT DETECTED NOT DETECTED Final   Klebsiella aerogenes NOT DETECTED NOT DETECTED Final   Klebsiella oxytoca NOT DETECTED NOT DETECTED Final   Klebsiella pneumoniae NOT DETECTED NOT DETECTED Final   Proteus species NOT DETECTED NOT DETECTED Final   Salmonella  species NOT DETECTED NOT DETECTED Final   Serratia marcescens NOT DETECTED NOT DETECTED Final   Haemophilus influenzae NOT DETECTED NOT DETECTED Final   Neisseria meningitidis NOT DETECTED NOT DETECTED Final   Pseudomonas aeruginosa NOT DETECTED NOT DETECTED Final   Stenotrophomonas maltophilia NOT DETECTED NOT DETECTED Final   Candida albicans NOT DETECTED NOT DETECTED Final   Candida auris NOT DETECTED NOT DETECTED Final   Candida glabrata NOT DETECTED NOT DETECTED Final   Candida krusei NOT DETECTED NOT DETECTED Final   Candida parapsilosis NOT DETECTED NOT DETECTED Final   Candida tropicalis NOT DETECTED NOT DETECTED Final   Cryptococcus neoformans/gattii NOT DETECTED NOT DETECTED Final    Comment: Performed at [facility], [address] [place], [place] [address]  Resp panel by RT-PCR (RSV, Flu A&B, Covid)     Status: None   Collection Time: 12/03/24  8:17 PM   Specimen: Nasal Swab  Result Value Ref Range Status   SARS Coronavirus 2 by RT PCR NEGATIVE NEGATIVE Final   Influenza A by PCR NEGATIVE NEGATIVE Final   Influenza B by PCR NEGATIVE NEGATIVE Final    Comment: (NOTE) The Xpert Xpress SARS-CoV-2/FLU/RSV plus assay is intended as an aid in the diagnosis of influenza from Nasopharyngeal swab specimens and should not be used as a sole basis for treatment. Nasal washings and aspirates are unacceptable for Xpert Xpress SARS-CoV-2/FLU/RSV testing.  Fact Sheet for Patients: bloggercourse.com  Fact Sheet for Healthcare Providers: seriousbroker.it  This test is not yet approved or cleared by the [place]  FDA and has been authorized for detection and/or diagnosis of SARS-CoV-2 by FDA under an Emergency Use Authorization (EUA). This EUA will remain in effect (meaning this test can be used) for the duration of the COVID-19 declaration under Section 564(b)(1) of the Act, 21 U.S.C. section 360bbb-3(b)(1), unless the  authorization is terminated or revoked.     Resp Syncytial Virus by PCR NEGATIVE NEGATIVE Final    Comment: (NOTE) Fact Sheet for Patients: bloggercourse.com  Fact Sheet for Healthcare Providers: seriousbroker.it  This test is not yet approved or cleared by the [place]  FDA and has been authorized for detection and/or diagnosis of SARS-CoV-2 by FDA under an Emergency Use Authorization (EUA). This EUA will remain in effect (meaning this test can be used) for the duration of the COVID-19 declaration under Section 564(b)(1) of the Act, 21 U.S.C. section 360bbb-3(b)(1), unless the authorization is terminated or revoked.  Performed at [facility], [address] [place], [place] [address]   Blood Culture (routine x 2)     Status: None (Preliminary result)   Collection Time: 12/03/24  9:29 PM   Specimen: BLOOD RIGHT HAND  Result Value Ref Range Status   Specimen Description BLOOD RIGHT HAND  Final   Special Requests   Final    BOTTLES DRAWN AEROBIC AND  ANAEROBIC Blood Culture adequate volume   Culture   Final    NO GROWTH 2 DAYS Performed at [facility], [address], [place], [place] [address]    Report Status PENDING  Incomplete  Urine Culture     Status: Abnormal (Preliminary result)   Collection Time: 12/03/24 11:04 PM   Specimen: Urine, Catheterized  Result Value Ref Range Status   Specimen Description URINE, CATHETERIZED  Final   Special Requests   Final    NONE Performed at [facility], [address], [place], [place] [address]    Culture (A)  Final    >=100,000 COLONIES/mL ENTEROCOCCUS FAECALIS >=100,000 COLONIES/mL LACTOBACILLUS SPECIES Standardized susceptibility testing for this organism is not available. SUSCEPTIBILITIES TO FOLLOW ENTEROCOCCUS FAECALIS    Report Status PENDING  Incomplete    Radiology Report CT ABDOMEN PELVIS W CONTRAST Result Date: 12/03/2024 EXAM: CT ABDOMEN  AND PELVIS WITH CONTRAST 12/03/2024 09:17:00 PM TECHNIQUE: CT of the abdomen and pelvis was performed with the administration of 65 mL of iohexol  (OMNIPAQUE ) 350 MG/ML injection. Multiplanar reformatted images are provided for review. Automated exposure control, iterative reconstruction, and/or weight-based adjustment of the mA/kV was utilized to reduce the radiation dose to as low as reasonably achievable. COMPARISON: None available. CLINICAL HISTORY: Abdominal pain, acute, nonlocalized. FINDINGS: LOWER CHEST: No acute abnormality. LIVER: The liver is unremarkable. GALLBLADDER AND BILE DUCTS: Gallbladder is unremarkable. No biliary ductal dilatation. SPLEEN: No acute abnormality. PANCREAS: No acute abnormality. ADRENAL GLANDS: No acute abnormality. KIDNEYS, URETERS AND BLADDER: Subcentimeter hypodensity within the right kidney, too small to characterize - no further follow-up indicated. No stones in the kidneys or ureters. No hydronephrosis. No perinephric or periureteral stranding. Lobulated urinary contour with intramural calcified fibroids. Urinary bladder lumen decompressed with foley catheter tip and balloon terminating within its lumen. GI AND BOWEL: Tiny hiatal hernia. Stomach demonstrates no acute abnormality. No small or large bowel thickening or dilatation. Colonic diverticulosis. The appendix is unremarkable. There is no bowel obstruction. PERITONEUM AND RETROPERITONEUM: No ascites. No free air. VASCULATURE: Moderate atherosclerotic plaque of the aorta. LYMPH NODES: No lymphadenopathy. REPRODUCTIVE ORGANS: No adnexal mass. BONES AND SOFT TISSUES: No acute osseous abnormality. No focal soft tissue abnormality. IMPRESSION: 1. No acute abnormality in the abdomen or pelvis. 2. Other, non-acute and/or normal findings as above. Electronically signed by: Morgane Naveau MD MD 12/03/2024 09:39 PM EST RP Workstation: [id number]   CT Angio Chest PE W and/or Wo Contrast Result Date: 12/03/2024 EXAM: CTA of the  Chest without and with contrast for PE 12/03/2024 09:17:00 PM TECHNIQUE: CTA of the chest was performed after the administration of 65 mL of intravenous iohexol  (OMNIPAQUE ) 350 MG/ML injection. Multiplanar reformatted images are provided for review. MIP images are provided for review. Automated exposure control, iterative reconstruction, and/or weight based adjustment of the mA/kV was utilized to reduce the radiation dose to as low as reasonably achievable. COMPARISON: None available. CLINICAL HISTORY: Pulmonary embolism (PE) suspected, high prob. FINDINGS: PULMONARY ARTERIES: Pulmonary arteries are adequately opacified for evaluation. No pulmonary embolus. Limited evaluation of the subsegmental level due to motion artifact. Main pulmonary artery is normal in caliber. MEDIASTINUM: The heart and pericardium demonstrate no acute abnormality. Left Anterior Descending Coronary Artery calcification. Mild atherosclerotic plaque. The thoracic aorta is normal in caliber. LYMPH NODES: No mediastinal, hilar or axillary lymphadenopathy. LUNGS AND PLEURA: Elevated right hemidiaphragm with right middle lobe atelectasis. No focal consolidation or pulmonary edema. No pleural effusion or pneumothorax. UPPER ABDOMEN: Limited images of  the upper abdomen demonstrate a tiny hiatal hernia. Otherwise, limited images of the upper abdomen are unremarkable. SOFT TISSUES AND BONES: Limited evaluation for acute rib fracture due to motion artifact. No acute soft tissue abnormality. IMPRESSION: 1. No pulmonary embolism, with limited evaluation at the subsegmental level due to motion artifact. 2. No acute findings. 3. Other, non-acute and/or normal findings as above. Electronically signed by: Morgane Naveau MD MD 12/03/2024 09:32 PM EST RP Workstation: [id number]   CT Head Wo Contrast Result Date: 12/03/2024 EXAM: CT HEAD WITHOUT CONTRAST 12/03/2024 09:17:00 PM TECHNIQUE: CT of the head was performed without the administration of intravenous  contrast. Automated exposure control, iterative reconstruction, and/or weight based adjustment of the mA/kV was utilized to reduce the radiation dose to as low as reasonably achievable. COMPARISON: CT head 08/01/2020. CLINICAL HISTORY: Mental status change, unknown cause. FINDINGS: BRAIN AND VENTRICLES: No acute hemorrhage. No evidence of acute infarct. No hydrocephalus. No extra-axial collection. No mass effect or midline shift. Cerebral ventricle sizes are concordant with the degree of cerebral volume loss. Periventricular white matter changes, likely sequela of chronic small vessel ischemic disease. ORBITS: No acute abnormality. SINUSES: No acute abnormality. SOFT TISSUES AND SKULL: No acute soft tissue abnormality. No skull fracture. IMPRESSION: 1. No acute intracranial abnormality. Electronically signed by: Morgane Naveau MD MD 12/03/2024 09:27 PM EST RP Workstation: [id number]   DG Chest Portable 1 View Result Date: 12/03/2024 EXAM: 1 VIEW(S) XRAY OF THE CHEST 12/03/2024 09:02:00 PM COMPARISON: Chest x-ray 10/28/2024, CT angio chest 12/03/2024. CLINICAL HISTORY: Questionable sepsis - evaluate for abnormality. FINDINGS: LUNGS AND PLEURA: Elevated right hemidiaphragm. Right lung base atelectasis. No pleural effusion. No pneumothorax. HEART AND MEDIASTINUM: Aortic atherosclerosis. BONES AND SOFT TISSUES: Bowel beneath right hemidiaphragm. No acute osseous abnormality. IMPRESSION: 1. No acute findings. Electronically signed by: Morgane Naveau MD MD 12/03/2024 09:20 PM EST RP Workstation: [id number]     Signature  -   Moise FALCON Paetyn Pietrzak M.D on 12/05/2024 at 9:57 AM   -  To page go to [email or web address]   "

## 2024-12-06 DIAGNOSIS — G9340 Encephalopathy, unspecified: Secondary | ICD-10-CM | POA: Diagnosis not present

## 2024-12-06 LAB — CBC WITH DIFFERENTIAL/PLATELET
Abs Immature Granulocytes: 0.13 K/uL — ABNORMAL HIGH (ref 0.00–0.07)
Basophils Absolute: 0 K/uL (ref 0.0–0.1)
Basophils Relative: 0 %
Eosinophils Absolute: 0.5 K/uL (ref 0.0–0.5)
Eosinophils Relative: 3 %
HCT: 33.4 % — ABNORMAL LOW (ref 36.0–46.0)
Hemoglobin: 11.4 g/dL — ABNORMAL LOW (ref 12.0–15.0)
Immature Granulocytes: 1 %
Lymphocytes Relative: 2 %
Lymphs Abs: 0.4 K/uL — ABNORMAL LOW (ref 0.7–4.0)
MCH: 31.4 pg (ref 26.0–34.0)
MCHC: 34.1 g/dL (ref 30.0–36.0)
MCV: 92 fL (ref 80.0–100.0)
Monocytes Absolute: 1.2 K/uL — ABNORMAL HIGH (ref 0.1–1.0)
Monocytes Relative: 6 %
Neutro Abs: 18 K/uL — ABNORMAL HIGH (ref 1.7–7.7)
Neutrophils Relative %: 88 %
Platelets: 289 K/uL (ref 150–400)
RBC: 3.63 MIL/uL — ABNORMAL LOW (ref 3.87–5.11)
RDW: 12.5 % (ref 11.5–15.5)
WBC: 20.3 K/uL — ABNORMAL HIGH (ref 4.0–10.5)
nRBC: 0 % (ref 0.0–0.2)

## 2024-12-06 LAB — CULTURE, BLOOD (ROUTINE X 2)
Culture  Setup Time: NO GROWTH
Special Requests: ADEQUATE

## 2024-12-06 LAB — BASIC METABOLIC PANEL WITH GFR
Anion gap: 10 (ref 5–15)
BUN: 15 mg/dL (ref 8–23)
CO2: 22 mmol/L (ref 22–32)
Calcium: 7.9 mg/dL — ABNORMAL LOW (ref 8.9–10.3)
Chloride: 100 mmol/L (ref 98–111)
Creatinine, Ser: 1.03 mg/dL — ABNORMAL HIGH (ref 0.44–1.00)
GFR, Estimated: 59 mL/min — ABNORMAL LOW
Glucose, Bld: 138 mg/dL — ABNORMAL HIGH (ref 70–99)
Potassium: 4 mmol/L (ref 3.5–5.1)
Sodium: 133 mmol/L — ABNORMAL LOW (ref 135–145)

## 2024-12-06 LAB — URINE CULTURE: Culture: >=100000 — AB

## 2024-12-06 LAB — MAGNESIUM: Magnesium: 1.9 mg/dL (ref 1.7–2.4)

## 2024-12-06 LAB — GLUCOSE, CAPILLARY: Glucose-Capillary: 130 mg/dL — ABNORMAL HIGH (ref 70–99)

## 2024-12-06 MED ORDER — CHLORHEXIDINE GLUCONATE CLOTH 2 % EX PADS
6.0000 | MEDICATED_PAD | Freq: Every day | CUTANEOUS | Status: DC
  Administered 2024-12-06 – 2024-12-11 (×6): 6 via TOPICAL

## 2024-12-06 MED ORDER — LINEZOLID 600 MG PO TABS
600.0000 mg | ORAL_TABLET | Freq: Two times a day (BID) | ORAL | Status: AC
  Administered 2024-12-06 – 2024-12-10 (×9): 600 mg via ORAL
  Filled 2024-12-06 (×9): qty 1

## 2024-12-06 MED ORDER — LINEZOLID 600 MG/300ML IV SOLN
600.0000 mg | Freq: Two times a day (BID) | INTRAVENOUS | Status: DC
  Administered 2024-12-06 (×2): 600 mg via INTRAVENOUS
  Filled 2024-12-06 (×3): qty 300

## 2024-12-06 MED ORDER — LACTATED RINGERS IV BOLUS
1000.0000 mL | Freq: Once | INTRAVENOUS | Status: AC
  Administered 2024-12-06: 1000 mL via INTRAVENOUS

## 2024-12-06 NOTE — Progress Notes (Signed)
" °  Chaplain responded to [place] consult for Advance DIrectives. Chaplain introduced spiritual care and offered support in the setting of inpatient admission. Ms Cary shared that she has previously completed Advance Directives, but has misplaced them so she would like to complete new documents. Chaplain walked pt through the hospital documents indicating which portions need completion and which to wait for a notary to complete. Pt also requested a copy for her sister which I provided. She will request a follow up visit for further questions, assistance, or when she is ready to sign with a notary.  Alan HERO. Davee Lomax, M.Div. [facility] Chaplain Pager [phone] Office [phone]     12/06/24 1501  Spiritual Encounters  Type of Visit Initial  Care provided to: Patient  Reason for visit Advance directives  Advance Directives (For Healthcare)  Does Patient Have a Medical Advance Directive? No  Would patient like information on creating a medical advance directive? Yes (Inpatient - patient defers creating a medical advance directive at this time - Information given)    "

## 2024-12-06 NOTE — TOC Initial Note (Signed)
 Transition of Care [facility]) - Initial/Assessment Note    Patient Details  Name: Mary Villanueva MRN: [id number] Date of Birth: Aug 18, 1956  Transition of [facility]) CM/SW Contact:    Lendia Dais, LCSWA Phone Number: 12/06/2024, 12:27 PM  Clinical Narrative: Pt is from [facility] and will return upon discharge. CSW spoke to the pt and pt's sister [name] at bedside.  Pt is wc bound, indep w/ ADL's, and doesn't drive. Pt has seen PCP in the last year, and sister/pt requested HCPOA documents. Spiritual consult placed.  CSW will continue to monitor for DC.                Expected Discharge Plan: Skilled Nursing Facility Barriers to Discharge: Continued Medical Work up   Patient Goals and CMS Choice Patient states their goals for this hospitalization and ongoing recovery are:: Remain independent and to stand again   Choice offered to / list presented to : NA      Expected Discharge Plan and Services In-house Referral: Clinical Social Work, Chaplain     Living arrangements for the past 2 months: Skilled Nursing Facility                                      Prior Living Arrangements/Services Living arrangements for the past 2 months: Skilled Nursing Facility Lives with:: Facility Resident Patient language and need for interpreter reviewed:: No Do you feel safe going back to the place where you live?: Yes      Need for Family Participation in Patient Care: No (Comment) Care giver support system in place?: No (comment) Current home services: DME Criminal Activity/Legal Involvement Pertinent to Current Situation/Hospitalization: No - Comment as needed  Activities of Daily Living      Permission Sought/Granted Permission sought to share information with : Family Supports Permission granted to share information with : Yes, Verbal Permission Granted  Share Information with NAME: [name] Stubb     Permission granted to share info w Relationship: Sister  Permission granted to  share info w Contact Information: [phone]  Emotional Assessment Appearance:: Appears stated age Attitude/Demeanor/Rapport: Engaged Affect (typically observed): Appropriate, Pleasant Orientation: : Oriented to Situation, Oriented to Self, Oriented to Place, Oriented to  Time Alcohol / Substance Use: Not Applicable Psych Involvement: No (comment)  Admission diagnosis:  Lower urinary tract infectious disease [N39.0] Disorientation [R41.0] Acute encephalopathy [G93.40] Patient Active Problem List   Diagnosis Date Noted   Disorientation 12/04/2024   Generalized weakness 12/04/2024   History of multiple sclerosis 12/04/2024   Acute renal failure superimposed on stage 3a chronic kidney disease (HCC) 12/04/2024   Acute encephalopathy 12/03/2024   Acute cystitis 10/29/2024   Lower extremity weakness 10/29/2024   Wheelchair dependence 10/29/2024   History of depression 10/29/2024   Overactive bladder 10/29/2024   CKD (chronic kidney disease) stage 3, GFR 30-59 ml/min (HCC) 10/29/2024   Urinary tract infection without hematuria 04/29/2022   Steroid-induced hyperglycemia    AKI (acute kidney injury) 08/01/2020   Hypothyroidism 08/01/2020   Swelling of both lower extremities 09/09/2019   Gait abnormality 10/14/2018   Abdominal pain 06/16/2018   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 ([address]), [place] - [address] [place] ([place]) [place] [address] Phone: [phone] Fax: [phone]  [facility]) - [place], [place] - [address] [facility] [address] [place] [address] [place] [address] Phone: [phone] Fax: [phone]  [facility] Delivery - [place], [place]  - [address] [place] [address] Phone: [phone] Fax: [phone]  [facility] [address] [place], [place] - [address] [place] [address] [place] [address] Phone: [phone] Fax: [phone]     Social Drivers of Health (SDOH) Social History: SDOH Screenings   Food Insecurity: No Food Insecurity (10/30/2024)  Housing: Low Risk (10/30/2024)  Transportation Needs: Patient Unable To Answer (12/04/2024)  Utilities: Not At Risk (10/30/2024)  Social Connections: Unknown (12/04/2024)  Tobacco Use: Medium Risk (12/03/2024)   SDOH Interventions:     Readmission Risk Interventions     No data to display

## 2024-12-06 NOTE — Evaluation (Signed)
 Occupational Therapy Evaluation Patient Details Name: Mary Villanueva MRN: [id number] DOB: 09-06-1956 Today's Date: 12/06/2024   History of Present Illness   Pt is a 69 y.o. female admitted 1/9 with acute encephalopathy. PMH:  MS for 40 years initially stable however progressively getting worse over last 10 years, chronic BLE weakness, spasticity, depression, anemia, anxiety, overactive bladder, CKD stage IIIa, hypothyroidism, HTN     Clinical Impressions Patient admitted for the diagnosis above.  PTA she was undergoing short term rehab at a local SNF.  Patient alert and conversational.  Willing to attempt EOB sitting with Max A and expressed pain to bilateral lower extremities.  OT will continue efforts in the acute setting for attempted +2 to recliner.  Patient will benefit from continued inpatient follow up therapy, <3 hours/day.     If plan is discharge home, recommend the following:   A lot of help with bathing/dressing/bathroom;Two people to help with walking and/or transfers;Assist for transportation     Functional Status Assessment   Patient has had a recent decline in their functional status and demonstrates the ability to make significant improvements in function in a reasonable and predictable amount of time.     Equipment Recommendations   None recommended by OT     Recommendations for Other Services         Precautions/Restrictions   Precautions Precautions: Fall Recall of Precautions/Restrictions: Intact Restrictions Weight Bearing Restrictions Per Provider Order: No Other Position/Activity Restrictions: R leg drawn up into flexion, contracted.  Very painful.     Mobility Bed Mobility Overal bed mobility: Needs Assistance Bed Mobility: Supine to Sit, Sit to Supine     Supine to sit: Max assist Sit to supine: Max assist   General bed mobility comments: pain to LE's with movement.    Transfers                   General transfer  comment: NT      Balance Overall balance assessment: Needs assistance Sitting-balance support: Bilateral upper extremity supported Sitting balance-Leahy Scale: Poor   Postural control: Posterior lean, Left lateral lean                                 ADL either performed or assessed with clinical judgement   ADL Overall ADL's : Needs assistance/impaired Eating/Feeding: Set up;Bed level   Grooming: Wash/dry hands;Wash/dry face;Set up;Bed level   Upper Body Bathing: Minimal assistance;Bed level   Lower Body Bathing: Maximal assistance;Bed level   Upper Body Dressing : Minimal assistance;Bed level   Lower Body Dressing: Maximal assistance;Bed level       Toileting- Clothing Manipulation and Hygiene: Total assistance;Bed level         General ADL Comments: Patient states she has been standing with SNF rehab.     Vision   Vision Assessment?: No apparent visual deficits     Perception Perception: Not tested       Praxis Praxis: Not tested       Pertinent Vitals/Pain Pain Assessment Pain Assessment: Faces Faces Pain Scale: Hurts even more Pain Location: BUE/LE with attempts to range or mobilize Pain Descriptors / Indicators: Grimacing, Guarding, Moaning Pain Intervention(s): Monitored during session     Extremity/Trunk Assessment Upper Extremity Assessment Upper Extremity Assessment: Generalized weakness   Lower Extremity Assessment Lower Extremity Assessment: Defer to PT evaluation   Cervical / Trunk Assessment Cervical / Trunk Assessment: Kyphotic  Communication Communication Communication: No apparent difficulties   Cognition Arousal: Alert Behavior During Therapy: Flat affect Cognition: No apparent impairments                               Following commands: Intact       Cueing  General Comments   Cueing Techniques: Verbal cues   VSS on RA   Exercises     Shoulder Instructions      Home Living  Family/patient expects to be discharged to:: Skilled nursing facility                                 Additional Comments: Pt has been at [facility] since 10/[facility] hospitalization. Prior to that time she lived at home with her brother.      Prior Functioning/Environment Prior Level of Function : Needs assist               ADLs Comments: mod I basic ADLs prior to 10/2024 hospitalization, assist as needed at [facility] via staff.    OT Problem List: Decreased strength;Decreased range of motion;Decreased activity tolerance;Impaired balance (sitting and/or standing);Pain   OT Treatment/Interventions: Self-care/ADL training;Therapeutic activities;DME and/or AE instruction;Patient/family education;Balance training      OT Goals(Current goals can be found in the care plan section)   Acute Rehab OT Goals Patient Stated Goal: Return home OT Goal Formulation: With patient Time For Goal Achievement: 12/20/24 Potential to Achieve Goals: Fair   OT Frequency:  Min 2X/week    Co-evaluation              AM-PAC OT 6 Clicks Daily Activity     Outcome Measure Help from another person eating meals?: A Little Help from another person taking care of personal grooming?: A Little Help from another person toileting, which includes using toliet, bedpan, or urinal?: A Lot Help from another person bathing (including washing, rinsing, drying)?: A Lot Help from another person to put on and taking off regular upper body clothing?: A Little Help from another person to put on and taking off regular lower body clothing?: A Lot 6 Click Score: 15   End of Session Nurse Communication: Mobility status  Activity Tolerance: Patient tolerated treatment well Patient left: in bed;with call bell/phone within reach  OT Visit Diagnosis: Muscle weakness (generalized) (M62.81);Pain Pain - Right/Left: Left Pain - part of body: Leg                Time: 9244-9185 OT Time Calculation  (min): 19 min Charges:  OT General Charges $OT Visit: 1 Visit OT Evaluation $OT Eval Moderate Complexity: 1 Mod  12/06/2024  RP, OTR/L  Acute Rehabilitation Services  Office:  [phone]   Charlie JONETTA Halsted 12/06/2024, 8:42 AM

## 2024-12-06 NOTE — Plan of Care (Signed)

## 2024-12-06 NOTE — Progress Notes (Signed)
 Patient is hypotensive. She is afebrile and asymptomatic, is on antibiotics for UTI, and home antihypertensives have been held. Plan to give IVF bolus and check lactic acid.

## 2024-12-06 NOTE — NC FL2 (Signed)
 " [place]  MEDICAID FL2 LEVEL OF CARE FORM     IDENTIFICATION  Patient Name: Mary Villanueva Birthdate: 09-09-1956 Sex: female Admission Date (Current Location): 12/03/2024  [facility] and [place] Number:  Producer, Television/film/video and Address:  [facility], [address] [place], [place] [address]      Provider Number: [id number]  Attending Physician Name and Address:  Leotis Bogus, MD  Relative Name and Phone Number:  Kendell Holley Ahumada, Emergency Contact  [phone]    Current Level of Care: Hospital Recommended Level of Care: Skilled Nursing Facility Prior Approval Number:    Date Approved/Denied:   PASRR Number: [id number]  Discharge Plan: SNF    Current Diagnoses: Patient Active Problem List   Diagnosis Date Noted   Disorientation 12/04/2024   Generalized weakness 12/04/2024   History of multiple sclerosis 12/04/2024   Acute renal failure superimposed on stage 3a chronic kidney disease (HCC) 12/04/2024   Acute encephalopathy 12/03/2024   Acute cystitis 10/29/2024   Lower extremity weakness 10/29/2024   Wheelchair dependence 10/29/2024   History of depression 10/29/2024   Overactive bladder 10/29/2024   CKD (chronic kidney disease) stage 3, GFR 30-59 ml/min (HCC) 10/29/2024   Urinary tract infection without hematuria 04/29/2022   Steroid-induced hyperglycemia    AKI (acute kidney injury) 08/01/2020   Hypothyroidism 08/01/2020   Swelling of both lower extremities 09/09/2019   Gait abnormality 10/14/2018   Abdominal pain 06/16/2018   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   Essential hypertension    Depression    High cholesterol    Multiple sclerosis exacerbation (HCC)    Hypercholesteremia    History of multiple sclerosis (HCC) 03/22/2013    Orientation RESPIRATION BLADDER Height & Weight     Self, Time, Situation, Place  Normal   Weight: 143 lb 4.8 oz (65 kg) Height:     BEHAVIORAL SYMPTOMS/MOOD  NEUROLOGICAL BOWEL NUTRITION STATUS        Diet (see dc summary)  AMBULATORY STATUS COMMUNICATION OF NEEDS Skin   Total Care Verbally Other (Comment) (errythema)                       Personal Care Assistance Level of Assistance  Bathing, Feeding, Dressing Bathing Assistance: Maximum assistance Feeding assistance: Independent Dressing Assistance: Maximum assistance     Functional Limitations Info  Sight, Speech, Hearing     Speech Info: Adequate    SPECIAL CARE FACTORS FREQUENCY  PT (By licensed PT), OT (By licensed OT)     PT Frequency: 5x/wk OT Frequency: 5x/wk            Contractures Contractures Info: Not present    Additional Factors Info  Code Status, Allergies Code Status Info: FULL Allergies Info: Unasyn  (Ampicillin -sulbactam Sodium )           Current Medications (12/06/2024):  This is the current hospital active medication list Current Facility-Administered Medications  Medication Dose Route Frequency Provider Last Rate Last Admin   acetaminophen  (TYLENOL ) tablet 650 mg  650 mg Oral Q6H PRN Amponsah, Prosper M, MD   650 mg at 12/06/24 0644   Or   acetaminophen  (TYLENOL ) suppository 650 mg  650 mg Rectal Q6H PRN Amponsah, Prosper M, MD   650 mg at 12/05/24 1036   baclofen  (LIORESAL ) tablet 30 mg  30 mg Oral TID Imam, Shehab F, DO   30 mg at 12/06/24 9077   bisacodyl  (DULCOLAX) EC tablet 5 mg  5 mg  Oral Daily PRN Lou Claretta HERO, MD       Chlorhexidine  Gluconate Cloth 2 % PADS 6 each  6 each Topical Daily Leotis Bogus, MD       enoxaparin  (LOVENOX ) injection 30 mg  30 mg Subcutaneous Q24H Amponsah, Prosper M, MD   30 mg at 12/06/24 9076   linezolid  (ZYVOX ) tablet 600 mg  600 mg Oral Q12H Chen, Lydia D, [facility]       ondansetron  (ZOFRAN ) tablet 4 mg  4 mg Oral Q6H PRN Amponsah, Prosper M, MD       Or   ondansetron  (ZOFRAN ) injection 4 mg  4 mg Intravenous Q6H PRN Amponsah, Prosper M, MD   4 mg at 12/04/24 0214   senna-docusate (Senokot-S) tablet 1  tablet  1 tablet Oral QHS PRN Amponsah, Prosper M, MD         Discharge Medications: Please see discharge summary for a list of discharge medications.  Relevant Imaging Results:  Relevant Lab Results:   Additional Information SSN#[id number]  Lendia Dais, LCSWA     "

## 2024-12-06 NOTE — Progress Notes (Signed)
 " PROGRESS NOTE    Mary Villanueva  FMW:[id number] DOB: [birth year] DOA: 12/03/2024 PCP: Larnell Hamilton, MD   Brief Narrative:  This 69 y.o. female with medical history significant for multiple sclerosis,  wheelchair-bound, history of MS for 40 years initially stable however progressively getting worse over last 10 years, chronic BLE weakness, gait abnormality, impaired mobility, spasticity, depression, anemia, anxiety, overactive bladder, CKD stage IIIa, hypothyroidism, and hypertension who presented from SNF for evaluation of altered mental status. Per EMS, since last evening, patient has been screaming in pain and has not been responding to staff as usual. They have attempted to give her pain medications and Ativan  to calm her down without any improvement in her mental status so EMS was called for evaluation. On EMS arrival, Patient was noted to have decreased respiration to around 6 and 7 with a period of apnea. 2 mg of Narcan administered with improvement in respiration.  At the bedside, patient constantly screaming but has a few moments of calmness. Per sister, patient has a foul-smelling urine and seems to be in pain.  Sister reports that patient is usually alert and oriented x 3 at baseline.  ED course :CT A/P with no acute intra-abdominal pathology. CT head with no acute intracranial abnormality. CTA chest PE study negative for PE.  She was found to have UTI and started on empiric antibiotics.  Patient admitted for further evaluation.  Assessment & Plan:   Principal Problem:   Acute encephalopathy Active Problems:   Disorientation   Generalized weakness   History of multiple sclerosis   Acute renal failure superimposed on stage 3a chronic kidney disease (HCC)  Acute encephalopathy >  resolved. Patient with history of MS presented from SNF due to altered mental status, screaming and not responding as usual. Workup with labs and imaging overall nonrevealing but shows possible UTI.  Patient with continued screaming and agitation in the ED, resolved after receiving IV Toradol  and IV Dilaudid  for pain.  CT head without any intracranial abnormalities.  Ammonia levels, LFTs within normal limits, troponin flat. Treatment of UTI ongoing Delirium precautions.   Acute cystitis: Leucocytosis: Patient with hx. of UTIs presented with altered mental status, and foul-smelling and cloudy urine. Urinalysis shows possible signs of infection.  Blood cultures no growth to date.  Urine culture with greater than 100 K colonies of Enterococcus, lactobacillus. Continue empiric treatment with IV rocephin ; susceptibilities pending. Leukocytosis likely due to UTI.   AKI on CKD 3A: Creatinine slightly elevated to 1.5 on admission, now back closer to baseline of 1.0-1.2.  Likely secondary to mild dehydration Continue IVLR at 150 cc/hr Trend renal function and avoid nephrotoxic meds   Essential HTN: - BP soft, will hold medications for now.   Generalized weakness: Impaired mobility/gait abnormality: In the setting of her MS and acute urinary infection. Continue baclofen . PT/OT eval and treat. Fall precautions.   Chronic medical problems # Hypothyroidism # Overactive bladder # Multiple sclerosis # Anxiety and depression Continue Home medications.  DVT prophylaxis: Lovenox  Code Status: Full code Family Communication: No family at bed side. Disposition Plan:   Status is: Inpatient Remains inpatient appropriate because: Mated for altered mental status likely secondary to UTI. Urine culture growing Enterococcus, sensitivity pending  Patient is not medically ready for discharge yet.   Consultants:  None  Procedures: None  Antimicrobials:  Anti-infectives (From admission, onward)    Start     Dose/Rate Route Frequency Ordered Stop   12/06/24 2200  linezolid  (ZYVOX ) tablet 600 mg  600 mg Oral Every 12 hours 12/06/24 1213     12/06/24 0145  linezolid  (ZYVOX ) IVPB 600 mg   Status:  Discontinued        600 mg 300 mL/hr over 60 Minutes Intravenous Every 12 hours 12/06/24 0056 12/06/24 1213   12/05/24 2300  Ampicillin -Sulbactam (UNASYN ) 3 g in sodium chloride  0.9 % 100 mL IVPB  Status:  Discontinued        3 g 200 mL/hr over 30 Minutes Intravenous Every 8 hours 12/05/24 2208 12/06/24 0055   12/04/24 2200  cefTRIAXone  (ROCEPHIN ) 1 g in sodium chloride  0.9 % 100 mL IVPB  Status:  Discontinued        1 g 200 mL/hr over 30 Minutes Intravenous Every 24 hours 12/04/24 0148 12/05/24 2208   12/03/24 2230  cefTRIAXone  (ROCEPHIN ) 2 g in sodium chloride  0.9 % 100 mL IVPB        2 g 200 mL/hr over 30 Minutes Intravenous  Once 12/03/24 2217 12/03/24 2311      Subjective: Patient was seen and examined at bedside.  Overnight events noted. Patient seems much improved. She is bedbound. She was febrile last night.  Objective: Vitals:   12/05/24 2100 12/06/24 0353 12/06/24 0830 12/06/24 0917  BP:  (!) 104/45 (!) 102/52 (!) 108/47  Pulse:  (!) 104 99 95  Resp:  18 19   Temp:  99.2 F (37.3 C) 98.3 F (36.8 C)   TempSrc:      SpO2:  99% 99% 100%  Weight: 65 kg       Intake/Output Summary (Last 24 hours) at 12/06/2024 1500 Last data filed at 12/06/2024 1215 Gross per 24 hour  Intake 1240 ml  Output 1200 ml  Net 40 ml   Filed Weights   12/05/24 2100  Weight: 65 kg    Examination:  General exam: Appears calm and comfortable, deconditioned, not in any acute distress. Respiratory system: Clear to auscultation. Respiratory effort normal.  RR 12 Cardiovascular system: S1 & S2 heard, RRR. No JVD, murmurs, rubs, gallops or clicks.  Gastrointestinal system: Abdomen is non distended, soft and non tender. Normal bowel sounds heard. Central nervous system: Alert and oriented x 3. No focal neurological deficits. Extremities: No edema, no cyanosis, no clubbing. Skin: No rashes, lesions or ulcers Psychiatry: Judgement and insight appear normal. Mood & affect appropriate.    Data Reviewed: I have personally reviewed following labs and imaging studies  CBC: Recent Labs  Lab 12/03/24 2016 12/03/24 2027 12/04/24 0530 12/06/24 0613  WBC 8.1  --  8.8 20.3*  NEUTROABS 6.0  --   --  18.0*  HGB 13.2 12.9  13.6 12.1 11.4*  HCT 39.2 38.0  40.0 36.4 33.4*  MCV 92.5  --  93.6 92.0  PLT 326  --  285 289   Basic Metabolic Panel: Recent Labs  Lab 12/03/24 2016 12/03/24 2027 12/04/24 0530 12/05/24 1117 12/06/24 0613  NA 134* 135  136 135 139 133*  K 4.8 4.8  4.8 4.9 4.5 4.0  CL 99 101 102 104 100  CO2 21*  --  23 20* 22  GLUCOSE 81 82 82 50* 138*  BUN 23 27* 21 14 15   CREATININE 1.31* 1.50* 1.18* 1.02* 1.03*  CALCIUM  9.0  --  8.3* 8.4* 7.9*  MG  --   --  2.1 1.9 1.9  PHOS  --   --  3.9  --   --    GFR: Estimated Creatinine Clearance: 43.4 mL/min (A) (by  C-G formula based on SCr of 1.03 mg/dL (H)). Liver Function Tests: Recent Labs  Lab 12/03/24 2016  AST 32  ALT 24  ALKPHOS 73  BILITOT 0.4  PROT 6.8  ALBUMIN 3.7   No results for input(s): LIPASE, AMYLASE in the last 168 hours. Recent Labs  Lab 12/03/24 2018  AMMONIA 23   Coagulation Profile: Recent Labs  Lab 12/03/24 2016  INR 1.0   Cardiac Enzymes: No results for input(s): CKTOTAL, CKMB, CKMBINDEX, TROPONINI in the last 168 hours. BNP (last 3 results) Recent Labs    12/03/24 2016  PROBNP <50.0   HbA1C: No results for input(s): HGBA1C in the last 72 hours. CBG: Recent Labs  Lab 12/06/24 0918  GLUCAP 130*   Lipid Profile: No results for input(s): CHOL, HDL, LDLCALC, TRIG, CHOLHDL, LDLDIRECT in the last 72 hours. Thyroid Function Tests: No results for input(s): TSH, T4TOTAL, FREET4, T3FREE, THYROIDAB in the last 72 hours. Anemia Panel: No results for input(s): VITAMINB12, FOLATE, FERRITIN, TIBC, IRON, RETICCTPCT in the last 72 hours. Sepsis Labs: Recent Labs  Lab 12/03/24 2027 12/03/24 2255  LATICACIDVEN 1.2 1.3     Recent Results (from the past 240 hours)  Blood Culture (routine x 2)     Status: Abnormal   Collection Time: 12/03/24  8:16 PM   Specimen: BLOOD  Result Value Ref Range Status   Specimen Description BLOOD RIGHT ANTECUBITAL  Final   Special Requests   Final    BOTTLES DRAWN AEROBIC AND ANAEROBIC Blood Culture adequate volume   Culture  Setup Time   Final    GRAM POSITIVE COCCI AEROBIC BOTTLE ONLY CRITICAL RESULT CALLED TO, READ BACK BY AND VERIFIED WITH: PHARMD G ABBOTT 12/05/2024 @ 0400 BY AB    Culture (A)  Final    STAPHYLOCOCCUS HOMINIS THE SIGNIFICANCE OF ISOLATING THIS ORGANISM FROM A SINGLE SET OF BLOOD CULTURES WHEN MULTIPLE SETS ARE DRAWN IS UNCERTAIN. PLEASE NOTIFY THE MICROBIOLOGY DEPARTMENT WITHIN ONE WEEK IF SPECIATION AND SENSITIVITIES ARE REQUIRED. Performed at [facility], [address], [place], [place] [address]    Report Status 12/06/2024 FINAL  Final  Blood Culture ID Panel (Reflexed)     Status: Abnormal   Collection Time: 12/03/24  8:16 PM  Result Value Ref Range Status   Enterococcus faecalis NOT DETECTED NOT DETECTED Final   Enterococcus Faecium NOT DETECTED NOT DETECTED Final   Listeria monocytogenes NOT DETECTED NOT DETECTED Final   Staphylococcus species DETECTED (A) NOT DETECTED Final    Comment: CRITICAL RESULT CALLED TO, READ BACK BY AND VERIFIED WITH: PHARMD G ABBOTT 12/05/2024 @ 0400 BY AB    Staphylococcus aureus (BCID) NOT DETECTED NOT DETECTED Final   Staphylococcus epidermidis NOT DETECTED NOT DETECTED Final   Staphylococcus lugdunensis NOT DETECTED NOT DETECTED Final   Streptococcus species NOT DETECTED NOT DETECTED Final   Streptococcus agalactiae NOT DETECTED NOT DETECTED Final   Streptococcus pneumoniae NOT DETECTED NOT DETECTED Final   Streptococcus pyogenes NOT DETECTED NOT DETECTED Final   A.calcoaceticus-baumannii NOT DETECTED NOT DETECTED Final   Bacteroides fragilis NOT DETECTED NOT DETECTED Final   Enterobacterales NOT  DETECTED NOT DETECTED Final   Enterobacter cloacae complex NOT DETECTED NOT DETECTED Final   Escherichia coli NOT DETECTED NOT DETECTED Final   Klebsiella aerogenes NOT DETECTED NOT DETECTED Final   Klebsiella oxytoca NOT DETECTED NOT DETECTED Final   Klebsiella pneumoniae NOT DETECTED NOT DETECTED Final   Proteus species NOT DETECTED NOT DETECTED Final   Salmonella species NOT  DETECTED NOT DETECTED Final   Serratia marcescens NOT DETECTED NOT DETECTED Final   Haemophilus influenzae NOT DETECTED NOT DETECTED Final   Neisseria meningitidis NOT DETECTED NOT DETECTED Final   Pseudomonas aeruginosa NOT DETECTED NOT DETECTED Final   Stenotrophomonas maltophilia NOT DETECTED NOT DETECTED Final   Candida albicans NOT DETECTED NOT DETECTED Final   Candida auris NOT DETECTED NOT DETECTED Final   Candida glabrata NOT DETECTED NOT DETECTED Final   Candida krusei NOT DETECTED NOT DETECTED Final   Candida parapsilosis NOT DETECTED NOT DETECTED Final   Candida tropicalis NOT DETECTED NOT DETECTED Final   Cryptococcus neoformans/gattii NOT DETECTED NOT DETECTED Final    Comment: Performed at [facility], [address], [place], [place] [address]  Resp panel by RT-PCR (RSV, Flu A&B, Covid)     Status: None   Collection Time: 12/03/24  8:17 PM   Specimen: Nasal Swab  Result Value Ref Range Status   SARS Coronavirus 2 by RT PCR NEGATIVE NEGATIVE Final   Influenza A by PCR NEGATIVE NEGATIVE Final   Influenza B by PCR NEGATIVE NEGATIVE Final    Comment: (NOTE) The Xpert Xpress SARS-CoV-2/FLU/RSV plus assay is intended as an aid in the diagnosis of influenza from Nasopharyngeal swab specimens and should not be used as a sole basis for treatment. Nasal washings and aspirates are unacceptable for Xpert Xpress SARS-CoV-2/FLU/RSV testing.  Fact Sheet for Patients: bloggercourse.com  Fact Sheet for Healthcare Providers: seriousbroker.it  This  test is not yet approved or cleared by the [place]  FDA and has been authorized for detection and/or diagnosis of SARS-CoV-2 by FDA under an Emergency Use Authorization (EUA). This EUA will remain in effect (meaning this test can be used) for the duration of the COVID-19 declaration under Section 564(b)(1) of the Act, 21 U.S.C. section 360bbb-3(b)(1), unless the authorization is terminated or revoked.     Resp Syncytial Virus by PCR NEGATIVE NEGATIVE Final    Comment: (NOTE) Fact Sheet for Patients: bloggercourse.com  Fact Sheet for Healthcare Providers: seriousbroker.it  This test is not yet approved or cleared by the [place]  FDA and has been authorized for detection and/or diagnosis of SARS-CoV-2 by FDA under an Emergency Use Authorization (EUA). This EUA will remain in effect (meaning this test can be used) for the duration of the COVID-19 declaration under Section 564(b)(1) of the Act, 21 U.S.C. section 360bbb-3(b)(1), unless the authorization is terminated or revoked.  Performed at [facility], [address], [place], [place] [address]   Blood Culture (routine x 2)     Status: None (Preliminary result)   Collection Time: 12/03/24  9:29 PM   Specimen: BLOOD RIGHT HAND  Result Value Ref Range Status   Specimen Description BLOOD RIGHT HAND  Final   Special Requests   Final    BOTTLES DRAWN AEROBIC AND ANAEROBIC Blood Culture adequate volume   Culture   Final    NO GROWTH 3 DAYS Performed at [facility], [address] [place], [place] [address]    Report Status PENDING  Incomplete  Urine Culture     Status: Abnormal   Collection Time: 12/03/24 11:04 PM   Specimen: Urine, Catheterized  Result Value Ref Range Status   Specimen Description URINE, CATHETERIZED  Final   Special Requests NONE  Final   Culture (A)  Final    >=100,000 COLONIES/mL ENTEROCOCCUS FAECALIS >=100,000 COLONIES/mL  LACTOBACILLUS SPECIES Standardized susceptibility testing for this organism is not available. Performed at  [facility], [address], [place], [place] [address]    Report Status 12/06/2024 FINAL  Final   Organism ID, Bacteria ENTEROCOCCUS FAECALIS (A)  Final      Susceptibility   Enterococcus faecalis - MIC*    AMPICILLIN  <=2 SENSITIVE Sensitive     NITROFURANTOIN <=16 SENSITIVE Sensitive     VANCOMYCIN 1 SENSITIVE Sensitive     * >=100,000 COLONIES/mL ENTEROCOCCUS FAECALIS  Culture, blood (Routine X 2) w Reflex to ID Panel     Status: None (Preliminary result)   Collection Time: 12/05/24 11:17 AM   Specimen: BLOOD RIGHT HAND  Result Value Ref Range Status   Specimen Description BLOOD RIGHT HAND  Final   Special Requests   Final    BOTTLES DRAWN AEROBIC AND ANAEROBIC Blood Culture results may not be optimal due to an inadequate volume of blood received in culture bottles   Culture   Final    NO GROWTH < 24 HOURS Performed at [facility], [address], [place], [place] [address]    Report Status PENDING  Incomplete  Culture, blood (Routine X 2) w Reflex to ID Panel     Status: None (Preliminary result)   Collection Time: 12/05/24 11:17 AM   Specimen: BLOOD RIGHT ARM  Result Value Ref Range Status   Specimen Description BLOOD RIGHT ARM  Final   Special Requests   Final    BOTTLES DRAWN AEROBIC AND ANAEROBIC Blood Culture results may not be optimal due to an inadequate volume of blood received in culture bottles   Culture   Final    NO GROWTH < 24 HOURS Performed at [facility], [address] [place], [place] [address]    Report Status PENDING  Incomplete   Radiology Studies: No results found.  Scheduled Meds:  baclofen   30 mg Oral TID   Chlorhexidine  Gluconate Cloth  6 each Topical Daily   enoxaparin  (LOVENOX ) injection  30 mg Subcutaneous Q24H   linezolid   600 mg Oral Q12H   Continuous Infusions:   LOS: 2 days    Time spent: 50  mins   Darcel Dawley, MD [facility]   If 7PM-7AM, please contact night-coverage  "

## 2024-12-06 NOTE — Progress Notes (Signed)
 Patient was seen for a rash.   She developed non-pruritic erythematous rash involving trunk and proximal UEs with an area of blistering on her flank. She did not realize she had it and has no other new signs or symptoms. There is no oral or ocular involvement.   Per RN, the rash was not there at ~19:30. She had been on ceftriaxone  but this was changed to Unasyn  based on preliminary culture data and she received her first dose at ~23:00.   Discussed with pharmacy, appreciate their assistance. Unasyn  added to allergy list. Will start linezolid  for now and monitor patient closely.

## 2024-12-07 DIAGNOSIS — G9340 Encephalopathy, unspecified: Secondary | ICD-10-CM | POA: Diagnosis not present

## 2024-12-07 LAB — CBC WITH DIFFERENTIAL/PLATELET
Abs Immature Granulocytes: 0.13 K/uL — ABNORMAL HIGH (ref 0.00–0.07)
Basophils Absolute: 0 K/uL (ref 0.0–0.1)
Basophils Relative: 0 %
Eosinophils Absolute: 0.4 K/uL (ref 0.0–0.5)
Eosinophils Relative: 3 %
HCT: 30.2 % — ABNORMAL LOW (ref 36.0–46.0)
Hemoglobin: 10.6 g/dL — ABNORMAL LOW (ref 12.0–15.0)
Immature Granulocytes: 1 %
Lymphocytes Relative: 4 %
Lymphs Abs: 0.7 K/uL (ref 0.7–4.0)
MCH: 31.6 pg (ref 26.0–34.0)
MCHC: 35.1 g/dL (ref 30.0–36.0)
MCV: 90.1 fL (ref 80.0–100.0)
Monocytes Absolute: 0.7 K/uL (ref 0.1–1.0)
Monocytes Relative: 4 %
Neutro Abs: 14.4 K/uL — ABNORMAL HIGH (ref 1.7–7.7)
Neutrophils Relative %: 88 %
Platelets: 293 K/uL (ref 150–400)
RBC: 3.35 MIL/uL — ABNORMAL LOW (ref 3.87–5.11)
RDW: 12.6 % (ref 11.5–15.5)
WBC: 16.4 K/uL — ABNORMAL HIGH (ref 4.0–10.5)
nRBC: 0 % (ref 0.0–0.2)

## 2024-12-07 LAB — BASIC METABOLIC PANEL WITH GFR
Anion gap: 8 (ref 5–15)
BUN: 11 mg/dL (ref 8–23)
CO2: 25 mmol/L (ref 22–32)
Calcium: 7.9 mg/dL — ABNORMAL LOW (ref 8.9–10.3)
Chloride: 103 mmol/L (ref 98–111)
Creatinine, Ser: 0.85 mg/dL (ref 0.44–1.00)
GFR, Estimated: >60 mL/min
Glucose, Bld: 111 mg/dL — ABNORMAL HIGH (ref 70–99)
Potassium: 3.8 mmol/L (ref 3.5–5.1)
Sodium: 137 mmol/L (ref 135–145)

## 2024-12-07 LAB — MAGNESIUM: Magnesium: 2 mg/dL (ref 1.7–2.4)

## 2024-12-07 LAB — LACTIC ACID, PLASMA: Lactic Acid, Venous: 1.1 mmol/L (ref 0.5–1.9)

## 2024-12-07 MED ORDER — TRAMADOL HCL 50 MG PO TABS
50.0000 mg | ORAL_TABLET | Freq: Four times a day (QID) | ORAL | Status: DC | PRN
  Administered 2024-12-07 – 2024-12-14 (×7): 50 mg via ORAL
  Filled 2024-12-07 (×9): qty 1

## 2024-12-07 NOTE — Progress Notes (Addendum)
 " PROGRESS NOTE    Mary Villanueva  FMW:[id number] DOB: 27-Feb-1956 DOA: 12/03/2024 PCP: Larnell Hamilton, MD   Brief Narrative:  This 69 y.o. female with medical history significant for multiple sclerosis,  wheelchair-bound, history of MS for 40 years initially stable however progressively getting worse over last 10 years, chronic BLE weakness, gait abnormality, impaired mobility, spasticity, depression, anemia, anxiety, overactive bladder, CKD stage IIIa, hypothyroidism, and hypertension who presented from SNF for evaluation of altered mental status. Per EMS, since last evening, patient has been screaming in pain and has not been responding to staff as usual. They have attempted to give her pain medications and Ativan  to calm her down without any improvement in her mental status so EMS was called for evaluation. On EMS arrival, Patient was noted to have decreased respiration to around 6 and 7 with a period of apnea. 2 mg of Narcan administered with improvement in respiration.  At the bedside, patient constantly screaming but has a few moments of calmness. Per sister, patient has a foul-smelling urine and seems to be in pain.  Sister reports that patient is usually alert and oriented x 3 at baseline.  ED course :CT A/P with no acute intra-abdominal pathology. CT head with no acute intracranial abnormality. CTA chest PE study negative for PE.  She was found to have UTI and started on empiric antibiotics.  Patient admitted for further evaluation.  Assessment & Plan:   Principal Problem:   Acute encephalopathy Active Problems:   Disorientation   Generalized weakness   History of multiple sclerosis   Acute renal failure superimposed on stage 3a chronic kidney disease (HCC)  Acute encephalopathy >  resolved. Patient with history of MS presented from SNF due to altered mental status, screaming and not responding as usual. Workup with labs and imaging overall nonrevealing but shows possible UTI.  Patient with continued screaming and agitation in the ED, resolved after receiving IV Toradol  and IV Dilaudid  for pain.  CT head without any intracranial abnormalities.  Ammonia levels, LFTs within normal limits, troponin flat. Treatment of UTI ongoing Delirium precautions.   Acute cystitis: Leucocytosis: Patient with hx. of UTIs presented with altered mental status, and foul-smelling and cloudy urine. Urinalysis shows possible signs of infection.  Blood cultures no growth to date.  Urine culture with greater than 100 K colonies of Enterococcus, lactobacillus. Continue empiric treatment with IV rocephin ; susceptibilities showed ampicillin  and vanco Leukocytosis likely due to UTI. Discussed with ID,  antibiotics can be changed with amoxicillin for 5 days.   AKI on CKD 3A: Creatinine slightly elevated to 1.5 on admission, now back closer to baseline of 1.0-1.2.  Likely secondary to mild dehydration. Continue IVLR at 150 cc/hr Renal Functions back to baseline.   Essential HTN: - BP soft, will hold medications for now.   Generalized weakness: Impaired mobility/gait abnormality: In the setting of her MS and acute urinary infection. Continue baclofen . PT/OT eval and treat. Fall precautions.   Chronic medical problems # Hypothyroidism # Overactive bladder # Multiple sclerosis # Anxiety and depression Continue Home medications.  DVT prophylaxis: Lovenox  Code Status: Full code Family Communication: No family at bed side. Disposition Plan:   Status is: Inpatient Remains inpatient appropriate because: Admitted for altered mental status likely secondary to UTI. Urine culture growing Enterococcus, Lactobacillus  sensitivity pending  Patient is not medically ready for discharge yet.   Consultants:  Infectious Diseases  Procedures: None  Antimicrobials:  Anti-infectives (From admission, onward)    Start  Dose/Rate Route Frequency Ordered Stop   12/06/24 2200  linezolid   (ZYVOX ) tablet 600 mg        600 mg Oral Every 12 hours 12/06/24 1213     12/06/24 0145  linezolid  (ZYVOX ) IVPB 600 mg  Status:  Discontinued        600 mg 300 mL/hr over 60 Minutes Intravenous Every 12 hours 12/06/24 0056 12/06/24 1213   12/05/24 2300  Ampicillin -Sulbactam (UNASYN ) 3 g in sodium chloride  0.9 % 100 mL IVPB  Status:  Discontinued        3 g 200 mL/hr over 30 Minutes Intravenous Every 8 hours 12/05/24 2208 12/06/24 0055   12/04/24 2200  cefTRIAXone  (ROCEPHIN ) 1 g in sodium chloride  0.9 % 100 mL IVPB  Status:  Discontinued        1 g 200 mL/hr over 30 Minutes Intravenous Every 24 hours 12/04/24 0148 12/05/24 2208   12/03/24 2230  cefTRIAXone  (ROCEPHIN ) 2 g in sodium chloride  0.9 % 100 mL IVPB        2 g 200 mL/hr over 30 Minutes Intravenous  Once 12/03/24 2217 12/03/24 2311      Subjective: Patient was seen and examined at bedside.  Overnight events noted. Patient reports having pain and low blood pressure overnight.  Objective: Vitals:   12/06/24 2221 12/07/24 0147 12/07/24 0613 12/07/24 0838  BP: (!) 102/31 (!) 105/49 91/78 (!) 96/59  Pulse: 93 89 79 81  Resp: 17  17 19   Temp:   97.9 F (36.6 C) 98.1 F (36.7 C)  TempSrc:   Oral   SpO2:  97% 100% 98%  Weight:        Intake/Output Summary (Last 24 hours) at 12/07/2024 1134 Last data filed at 12/07/2024 0600 Gross per 24 hour  Intake 2020.23 ml  Output 1975 ml  Net 45.23 ml   Filed Weights   12/05/24 2100  Weight: 65 kg    Examination:  General exam: Appears calm and comfortable, deconditioned, not in any acute distress. Respiratory system: CTA Bilaterally. Respiratory effort normal.  RR 11 Cardiovascular system: S1 & S2 heard, RRR. No JVD, murmurs, rubs, gallops or clicks.  Gastrointestinal system: Abdomen is non distended, soft and non tender. Normal bowel sounds heard. Central nervous system: Alert and oriented x 3. No focal neurological deficits. Extremities: No edema, no cyanosis, no  clubbing. Skin: No rashes, lesions or ulcers Psychiatry: Judgement and insight appear normal. Mood & affect appropriate.   Data Reviewed: I have personally reviewed following labs and imaging studies  CBC: Recent Labs  Lab 12/03/24 2016 12/03/24 2027 12/04/24 0530 12/06/24 0613 12/07/24 0410  WBC 8.1  --  8.8 20.3* 16.4*  NEUTROABS 6.0  --   --  18.0* 14.4*  HGB 13.2 12.9  13.6 12.1 11.4* 10.6*  HCT 39.2 38.0  40.0 36.4 33.4* 30.2*  MCV 92.5  --  93.6 92.0 90.1  PLT 326  --  285 289 293   Basic Metabolic Panel: Recent Labs  Lab 12/03/24 2016 12/03/24 2027 12/04/24 0530 12/05/24 1117 12/06/24 0613 12/07/24 0410  NA 134* 135  136 135 139 133* 137  K 4.8 4.8  4.8 4.9 4.5 4.0 3.8  CL 99 101 102 104 100 103  CO2 21*  --  23 20* 22 25  GLUCOSE 81 82 82 50* 138* 111*  BUN 23 27* 21 14 15 11   CREATININE 1.31* 1.50* 1.18* 1.02* 1.03* 0.85  CALCIUM  9.0  --  8.3* 8.4* 7.9* 7.9*  MG  --   --  2.1 1.9 1.9 2.0  PHOS  --   --  3.9  --   --   --    GFR: Estimated Creatinine Clearance: 52.6 mL/min (by C-G formula based on SCr of 0.85 mg/dL). Liver Function Tests: Recent Labs  Lab 12/03/24 2016  AST 32  ALT 24  ALKPHOS 73  BILITOT 0.4  PROT 6.8  ALBUMIN 3.7   No results for input(s): LIPASE, AMYLASE in the last 168 hours. Recent Labs  Lab 12/03/24 2018  AMMONIA 23   Coagulation Profile: Recent Labs  Lab 12/03/24 2016  INR 1.0   Cardiac Enzymes: No results for input(s): CKTOTAL, CKMB, CKMBINDEX, TROPONINI in the last 168 hours. BNP (last 3 results) Recent Labs    12/03/24 2016  PROBNP <50.0   HbA1C: No results for input(s): HGBA1C in the last 72 hours. CBG: Recent Labs  Lab 12/06/24 0918  GLUCAP 130*   Lipid Profile: No results for input(s): CHOL, HDL, LDLCALC, TRIG, CHOLHDL, LDLDIRECT in the last 72 hours. Thyroid Function Tests: No results for input(s): TSH, T4TOTAL, FREET4, T3FREE, THYROIDAB in the last 72  hours. Anemia Panel: No results for input(s): VITAMINB12, FOLATE, FERRITIN, TIBC, IRON, RETICCTPCT in the last 72 hours. Sepsis Labs: Recent Labs  Lab 12/03/24 2027 12/03/24 2255 12/06/24 2320  LATICACIDVEN 1.2 1.3 1.1    Recent Results (from the past 240 hours)  Blood Culture (routine x 2)     Status: Abnormal   Collection Time: 12/03/24  8:16 PM   Specimen: BLOOD  Result Value Ref Range Status   Specimen Description BLOOD RIGHT ANTECUBITAL  Final   Special Requests   Final    BOTTLES DRAWN AEROBIC AND ANAEROBIC Blood Culture adequate volume   Culture  Setup Time   Final    GRAM POSITIVE COCCI AEROBIC BOTTLE ONLY CRITICAL RESULT CALLED TO, READ BACK BY AND VERIFIED WITH: PHARMD G ABBOTT 12/05/2024 @ 0400 BY AB    Culture (A)  Final    STAPHYLOCOCCUS HOMINIS THE SIGNIFICANCE OF ISOLATING THIS ORGANISM FROM A SINGLE SET OF BLOOD CULTURES WHEN MULTIPLE SETS ARE DRAWN IS UNCERTAIN. PLEASE NOTIFY THE MICROBIOLOGY DEPARTMENT WITHIN ONE WEEK IF SPECIATION AND SENSITIVITIES ARE REQUIRED. Performed at [facility], [address], [place], [place] [address]    Report Status 12/06/2024 FINAL  Final  Blood Culture ID Panel (Reflexed)     Status: Abnormal   Collection Time: 12/03/24  8:16 PM  Result Value Ref Range Status   Enterococcus faecalis NOT DETECTED NOT DETECTED Final   Enterococcus Faecium NOT DETECTED NOT DETECTED Final   Listeria monocytogenes NOT DETECTED NOT DETECTED Final   Staphylococcus species DETECTED (A) NOT DETECTED Final    Comment: CRITICAL RESULT CALLED TO, READ BACK BY AND VERIFIED WITH: PHARMD G ABBOTT 12/05/2024 @ 0400 BY AB    Staphylococcus aureus (BCID) NOT DETECTED NOT DETECTED Final   Staphylococcus epidermidis NOT DETECTED NOT DETECTED Final   Staphylococcus lugdunensis NOT DETECTED NOT DETECTED Final   Streptococcus species NOT DETECTED NOT DETECTED Final   Streptococcus agalactiae NOT DETECTED NOT DETECTED Final   Streptococcus  pneumoniae NOT DETECTED NOT DETECTED Final   Streptococcus pyogenes NOT DETECTED NOT DETECTED Final   A.calcoaceticus-baumannii NOT DETECTED NOT DETECTED Final   Bacteroides fragilis NOT DETECTED NOT DETECTED Final   Enterobacterales NOT DETECTED NOT DETECTED Final   Enterobacter cloacae complex NOT DETECTED NOT DETECTED Final   Escherichia coli NOT DETECTED NOT DETECTED Final   Klebsiella aerogenes NOT DETECTED NOT DETECTED  Final   Klebsiella oxytoca NOT DETECTED NOT DETECTED Final   Klebsiella pneumoniae NOT DETECTED NOT DETECTED Final   Proteus species NOT DETECTED NOT DETECTED Final   Salmonella species NOT DETECTED NOT DETECTED Final   Serratia marcescens NOT DETECTED NOT DETECTED Final   Haemophilus influenzae NOT DETECTED NOT DETECTED Final   Neisseria meningitidis NOT DETECTED NOT DETECTED Final   Pseudomonas aeruginosa NOT DETECTED NOT DETECTED Final   Stenotrophomonas maltophilia NOT DETECTED NOT DETECTED Final   Candida albicans NOT DETECTED NOT DETECTED Final   Candida auris NOT DETECTED NOT DETECTED Final   Candida glabrata NOT DETECTED NOT DETECTED Final   Candida krusei NOT DETECTED NOT DETECTED Final   Candida parapsilosis NOT DETECTED NOT DETECTED Final   Candida tropicalis NOT DETECTED NOT DETECTED Final   Cryptococcus neoformans/gattii NOT DETECTED NOT DETECTED Final    Comment: Performed at [facility], [address] [place], [place] [address]  Resp panel by RT-PCR (RSV, Flu A&B, Covid)     Status: None   Collection Time: 12/03/24  8:17 PM   Specimen: Nasal Swab  Result Value Ref Range Status   SARS Coronavirus 2 by RT PCR NEGATIVE NEGATIVE Final   Influenza A by PCR NEGATIVE NEGATIVE Final   Influenza B by PCR NEGATIVE NEGATIVE Final    Comment: (NOTE) The Xpert Xpress SARS-CoV-2/FLU/RSV plus assay is intended as an aid in the diagnosis of influenza from Nasopharyngeal swab specimens and should not be used as a sole basis for treatment. Nasal washings  and aspirates are unacceptable for Xpert Xpress SARS-CoV-2/FLU/RSV testing.  Fact Sheet for Patients: bloggercourse.com  Fact Sheet for Healthcare Providers: seriousbroker.it  This test is not yet approved or cleared by the [place]  FDA and has been authorized for detection and/or diagnosis of SARS-CoV-2 by FDA under an Emergency Use Authorization (EUA). This EUA will remain in effect (meaning this test can be used) for the duration of the COVID-19 declaration under Section 564(b)(1) of the Act, 21 U.S.C. section 360bbb-3(b)(1), unless the authorization is terminated or revoked.     Resp Syncytial Virus by PCR NEGATIVE NEGATIVE Final    Comment: (NOTE) Fact Sheet for Patients: bloggercourse.com  Fact Sheet for Healthcare Providers: seriousbroker.it  This test is not yet approved or cleared by the [place]  FDA and has been authorized for detection and/or diagnosis of SARS-CoV-2 by FDA under an Emergency Use Authorization (EUA). This EUA will remain in effect (meaning this test can be used) for the duration of the COVID-19 declaration under Section 564(b)(1) of the Act, 21 U.S.C. section 360bbb-3(b)(1), unless the authorization is terminated or revoked.  Performed at [facility], [address] [place], [place] [address]   Blood Culture (routine x 2)     Status: None (Preliminary result)   Collection Time: 12/03/24  9:29 PM   Specimen: BLOOD RIGHT HAND  Result Value Ref Range Status   Specimen Description BLOOD RIGHT HAND  Final   Special Requests   Final    BOTTLES DRAWN AEROBIC AND ANAEROBIC Blood Culture adequate volume   Culture   Final    NO GROWTH 4 DAYS Performed at [facility], [address], [place], [place] [address]    Report Status PENDING  Incomplete  Urine Culture     Status: Abnormal   Collection Time: 12/03/24 11:04 PM    Specimen: Urine, Catheterized  Result Value Ref Range Status   Specimen Description URINE, CATHETERIZED  Final  Special Requests NONE  Final   Culture (A)  Final    >=100,000 COLONIES/mL ENTEROCOCCUS FAECALIS >=100,000 COLONIES/mL LACTOBACILLUS SPECIES Standardized susceptibility testing for this organism is not available. Performed at [facility], [address] [place], [place] [address]    Report Status 12/06/2024 FINAL  Final   Organism ID, Bacteria ENTEROCOCCUS FAECALIS (A)  Final      Susceptibility   Enterococcus faecalis - MIC*    AMPICILLIN  <=2 SENSITIVE Sensitive     NITROFURANTOIN <=16 SENSITIVE Sensitive     VANCOMYCIN 1 SENSITIVE Sensitive     * >=100,000 COLONIES/mL ENTEROCOCCUS FAECALIS  Culture, blood (Routine X 2) w Reflex to ID Panel     Status: None (Preliminary result)   Collection Time: 12/05/24 11:17 AM   Specimen: BLOOD RIGHT HAND  Result Value Ref Range Status   Specimen Description BLOOD RIGHT HAND  Final   Special Requests   Final    BOTTLES DRAWN AEROBIC AND ANAEROBIC Blood Culture results may not be optimal due to an inadequate volume of blood received in culture bottles   Culture   Final    NO GROWTH 2 DAYS Performed at [facility], [address] [place], [place] [address]    Report Status PENDING  Incomplete  Culture, blood (Routine X 2) w Reflex to ID Panel     Status: None (Preliminary result)   Collection Time: 12/05/24 11:17 AM   Specimen: BLOOD RIGHT ARM  Result Value Ref Range Status   Specimen Description BLOOD RIGHT ARM  Final   Special Requests   Final    BOTTLES DRAWN AEROBIC AND ANAEROBIC Blood Culture results may not be optimal due to an inadequate volume of blood received in culture bottles   Culture   Final    NO GROWTH 2 DAYS Performed at [facility], [address], [place], [place] [address]    Report Status PENDING  Incomplete   Radiology Studies: No results found.  Scheduled Meds:  baclofen   30 mg  Oral TID   Chlorhexidine  Gluconate Cloth  6 each Topical Daily   enoxaparin  (LOVENOX ) injection  30 mg Subcutaneous Q24H   linezolid   600 mg Oral Q12H   Continuous Infusions:   LOS: 3 days    Time spent: 35 mins   Darcel Dawley, MD [facility]   If 7PM-7AM, please contact night-coverage  "

## 2024-12-07 NOTE — Care Management Important Message (Signed)
 Important Message  Patient Details  Name: Mary Villanueva MRN: [id number] Date of Birth: Jun 02, 1956   Important Message Given:  Yes - Medicare IM     Claretta Deed 12/07/2024, 2:42 PM

## 2024-12-07 NOTE — Progress Notes (Signed)
 Physical Therapy Treatment Patient Details Name: Mary Villanueva MRN: [id number] DOB: [birth year] Today's Date: 12/07/2024   History of Present Illness Pt is a 69 y.o. female admitted 1/9 with acute encephalopathy. PMH:  MS for 40 years initially stable however progressively getting worse over last 10 years, chronic BLE weakness, spasticity, depression, anemia, anxiety, overactive bladder, CKD stage IIIa, hypothyroidism, HTN    PT Comments  Pt tolerates treatment well, progressing to sitting edge of bed. Pt remains contracted in BLE, with L knee lacking ~ 10 degrees of knee extension after PROM and stretching from PT as well as L ankle PF and inversion contracture. Pt's R knee remains contracted in flexion >100 degrees even after PT attempts at stretching and PROM, pt reporting pain and experiencing spasms with PT attempts at low load gradual extension. Pt provides conflicting reports regarding timeline of her LE contractures, initially reporting she was able to transfer up until last hospitalization but later reporting her RLE has been contracted for many months. Pt may benefit from attempts at utilizing the hoyer lift for transfer to a tilt in space wheelchair, or attempts at lateral scoot transfers in future sessions. Patient will benefit from continued inpatient follow up therapy, <3 hours/day.   If plan is discharge home, recommend the following: Two people to help with walking and/or transfers;Two people to help with bathing/dressing/bathroom   Can travel by private vehicle     No  Equipment Recommendations  Hospital bed;Hoyer lift    Recommendations for Other Services       Precautions / Restrictions Precautions Precautions: Fall Recall of Precautions/Restrictions: Intact Restrictions Weight Bearing Restrictions Per Provider Order: No     Mobility  Bed Mobility Overal bed mobility: Needs Assistance Bed Mobility: Supine to Sit, Sit to Supine     Supine to sit: Max  assist Sit to supine: Total assist, HOB elevated, Used rails        Transfers Overall transfer level:  (pt is unable to achieve foot flat on the floor with RLE due to knee flexion contracture. L knee has flexion ROM to 90 degrees but ankle is contracted into plantar flexion and inversion, preventing even possible squat pivot on LLE only.)                      Ambulation/Gait                   Stairs             Wheelchair Mobility     Tilt Bed    Modified Rankin (Stroke Patients Only)       Balance Overall balance assessment: Needs assistance Sitting-balance support: Single extremity supported, Bilateral upper extremity supported, Feet unsupported Sitting balance-Leahy Scale: Poor Sitting balance - Comments: CGA-minA Postural control: Posterior lean                                  Communication Communication Communication: Impaired Factors Affecting Communication: Difficulty expressing self (intermittently)  Cognition Arousal: Alert Behavior During Therapy: WFL for tasks assessed/performed   PT - Cognitive impairments: Memory                       PT - Cognition Comments: pt providing conflicting reports regarding PLOF and for how long her RLE has been contracted for Following commands: Intact      Cueing Cueing Techniques: Verbal cues  Exercises Other Exercises Other Exercises: PT provides PROM to bilateral knees in an effort to increase knee extension ROM. PT is able to assist pt to ~ -10 degrees L knee extension, R knee is unable to progress into knee extension >100 degrees at this time    General Comments General comments (skin integrity, edema, etc.): VSS on RA, prevalon boots ordered to prevent heel ulcers      Pertinent Vitals/Pain Pain Assessment Pain Assessment: Faces Faces Pain Scale: Hurts even more Pain Location: R knee Pain Descriptors / Indicators: Grimacing Pain Intervention(s): Monitored  during session    Home Living                          Prior Function            PT Goals (current goals can now be found in the care plan section) Acute Rehab PT Goals Patient Stated Goal: unable to state Progress towards PT goals: Progressing toward goals    Frequency    Min 2X/week      PT Plan      Co-evaluation              AM-PAC PT 6 Clicks Mobility   Outcome Measure  Help needed turning from your back to your side while in a flat bed without using bedrails?: A Lot Help needed moving from lying on your back to sitting on the side of a flat bed without using bedrails?: A Lot Help needed moving to and from a bed to a chair (including a wheelchair)?: Total Help needed standing up from a chair using your arms (e.g., wheelchair or bedside chair)?: Total Help needed to walk in hospital room?: Total Help needed climbing 3-5 steps with a railing? : Total 6 Click Score: 8    End of Session   Activity Tolerance: Patient tolerated treatment well Patient left: in bed;with call bell/phone within reach;with bed alarm set Nurse Communication: Mobility status;Need for lift equipment PT Visit Diagnosis: Other abnormalities of gait and mobility (R26.89);Muscle weakness (generalized) (M62.81);Pain     Time: 8468-8445 PT Time Calculation (min) (ACUTE ONLY): 23 min  Charges:    $Therapeutic Exercise: 8-22 mins $Therapeutic Activity: 8-22 mins PT General Charges $$ ACUTE PT VISIT: 1 Visit                     Bernardino JINNY Ruth, PT, DPT Acute Rehabilitation Office [phone]    Bernardino JINNY Ruth 12/07/2024, 4:14 PM

## 2024-12-08 DIAGNOSIS — G9340 Encephalopathy, unspecified: Secondary | ICD-10-CM | POA: Diagnosis not present

## 2024-12-08 LAB — BASIC METABOLIC PANEL WITH GFR
Anion gap: 8 (ref 5–15)
BUN: 9 mg/dL (ref 8–23)
CO2: 25 mmol/L (ref 22–32)
Calcium: 8 mg/dL — ABNORMAL LOW (ref 8.9–10.3)
Chloride: 105 mmol/L (ref 98–111)
Creatinine, Ser: 0.8 mg/dL (ref 0.44–1.00)
GFR, Estimated: >60 mL/min
Glucose, Bld: 97 mg/dL (ref 70–99)
Potassium: 3.8 mmol/L (ref 3.5–5.1)
Sodium: 138 mmol/L (ref 135–145)

## 2024-12-08 LAB — MAGNESIUM: Magnesium: 2.1 mg/dL (ref 1.7–2.4)

## 2024-12-08 LAB — CBC WITH DIFFERENTIAL/PLATELET
Abs Immature Granulocytes: 0.07 K/uL (ref 0.00–0.07)
Basophils Absolute: 0 K/uL (ref 0.0–0.1)
Basophils Relative: 0 %
Eosinophils Absolute: 0.4 K/uL (ref 0.0–0.5)
Eosinophils Relative: 4 %
HCT: 30.1 % — ABNORMAL LOW (ref 36.0–46.0)
Hemoglobin: 10.5 g/dL — ABNORMAL LOW (ref 12.0–15.0)
Immature Granulocytes: 1 %
Lymphocytes Relative: 9 %
Lymphs Abs: 0.9 K/uL (ref 0.7–4.0)
MCH: 31.5 pg (ref 26.0–34.0)
MCHC: 34.9 g/dL (ref 30.0–36.0)
MCV: 90.4 fL (ref 80.0–100.0)
Monocytes Absolute: 0.8 K/uL (ref 0.1–1.0)
Monocytes Relative: 8 %
Neutro Abs: 8.5 K/uL — ABNORMAL HIGH (ref 1.7–7.7)
Neutrophils Relative %: 78 %
Platelets: 308 K/uL (ref 150–400)
RBC: 3.33 MIL/uL — ABNORMAL LOW (ref 3.87–5.11)
RDW: 12.5 % (ref 11.5–15.5)
WBC: 10.7 K/uL — ABNORMAL HIGH (ref 4.0–10.5)
nRBC: 0 % (ref 0.0–0.2)

## 2024-12-08 LAB — CULTURE, BLOOD (ROUTINE X 2)
Culture: NO GROWTH
Special Requests: ADEQUATE

## 2024-12-08 NOTE — Progress Notes (Signed)
 " PROGRESS NOTE    Mary Villanueva Better  FMW:[id number] DOB: 1956/08/11 DOA: 12/03/2024 PCP: Larnell Hamilton, MD   Brief Narrative:  This 69 y.o. female with medical history significant for multiple sclerosis,  wheelchair-bound, history of MS for 40 years initially stable however progressively getting worse over last 10 years, chronic BLE weakness, gait abnormality, impaired mobility, spasticity, depression, anemia, anxiety, overactive bladder, CKD stage IIIa, hypothyroidism, and hypertension who presented from SNF for evaluation of altered mental status. Per EMS, since last evening, patient has been screaming in pain and has not been responding to staff as usual. They have attempted to give her pain medications and Ativan  to calm her down without any improvement in her mental status so EMS was called for evaluation. On EMS arrival, Patient was noted to have decreased respiration to around 6 and 7 with a period of apnea. 2 mg of Narcan administered with improvement in respiration.  At the bedside, patient constantly screaming but has a few moments of calmness. Per sister, patient has a foul-smelling urine and seems to be in pain.  Sister reports that patient is usually alert and oriented x 3 at baseline.  ED course :CT A/P with no acute intra-abdominal pathology. CT head with no acute intracranial abnormality. CTA chest PE study negative for PE.  She was found to have UTI and started on empiric antibiotics.  Patient admitted for further evaluation.  Assessment & Plan:   Principal Problem:   Acute encephalopathy Active Problems:   Disorientation   Generalized weakness   History of multiple sclerosis   Acute renal failure superimposed on stage 3a chronic kidney disease (HCC)  Acute encephalopathy >  resolved. Patient with history of MS presented from SNF due to altered mental status, screaming and not responding as usual.  Workup with labs and imaging overall nonrevealing but shows possible UTI.   Patient with continued screaming and agitation in the ED, resolved after receiving IV Toradol  and IV Dilaudid  for pain.   CT head without any intracranial abnormalities.  Ammonia levels, LFTs within normal limits, troponin flat. Treatment of UTI ongoing. Delirium precautions.   Acute cystitis: Leucocytosis: Patient with hx. of UTIs presented with altered mental status, and foul-smelling and cloudy urine.  Urinalysis shows possible signs of infection.  Blood cultures no growth to date.   Urine culture with greater than 100 K colonies of Enterococcus, lactobacillus. Continue empiric treatment with IV rocephin ; susceptibilities showed ampicillin  and vanco Leukocytosis likely due to UTI. Discussed with ID,  antibiotics can be changed with amoxicillin for 5 days.   AKI on CKD 3A: Creatinine slightly elevated to 1.5 on admission, now back closer to baseline of 1.0-1.2.  Likely secondary to mild dehydration. Renal Functions back to baseline.   Essential HTN: - BP soft, will hold medications for now.   Generalized weakness: Impaired mobility/gait abnormality: In the setting of her MS and acute urinary infection. Continue baclofen . PT/OT eval and treat. Fall precautions.   Chronic medical problems # Hypothyroidism # Overactive bladder # Multiple sclerosis # Anxiety and depression Continue Home medications.  DVT prophylaxis: Lovenox  Code Status: Full code Family Communication: No family at bed side. Disposition Plan:   Status is: Inpatient Remains inpatient appropriate because: Admitted for altered mental status likely secondary to UTI. Urine culture growing Enterococcus, Lactobacillus  sensitivity pending.  Patient is now medically ready for discharge pending SNF placement.   Consultants:  Infectious Diseases  Procedures: None  Antimicrobials:  Anti-infectives (From admission, onward)    Start  Dose/Rate Route Frequency Ordered Stop   12/06/24 2200  linezolid   (ZYVOX ) tablet 600 mg        600 mg Oral Every 12 hours 12/06/24 1213 12/11/24 0959   12/06/24 0145  linezolid  (ZYVOX ) IVPB 600 mg  Status:  Discontinued        600 mg 300 mL/hr over 60 Minutes Intravenous Every 12 hours 12/06/24 0056 12/06/24 1213   12/05/24 2300  Ampicillin -Sulbactam (UNASYN ) 3 g in sodium chloride  0.9 % 100 mL IVPB  Status:  Discontinued        3 g 200 mL/hr over 30 Minutes Intravenous Every 8 hours 12/05/24 2208 12/06/24 0055   12/04/24 2200  cefTRIAXone  (ROCEPHIN ) 1 g in sodium chloride  0.9 % 100 mL IVPB  Status:  Discontinued        1 g 200 mL/hr over 30 Minutes Intravenous Every 24 hours 12/04/24 0148 12/05/24 2208   12/03/24 2230  cefTRIAXone  (ROCEPHIN ) 2 g in sodium chloride  0.9 % 100 mL IVPB        2 g 200 mL/hr over 30 Minutes Intravenous  Once 12/03/24 2217 12/03/24 2311      Subjective: Patient was seen and examined at bedside.  Overnight events noted. Patient reports having pain and asking for pain medications.   Does not want to have strong pain medications.  Objective: Vitals:   12/07/24 1651 12/07/24 2127 12/08/24 0620 12/08/24 0816  BP: (!) 96/34 (!) 103/48 (!) 98/45 (!) 118/44  Pulse: 80 81 83 83  Resp: 19 18 13 18   Temp: 98.1 F (36.7 C) 98 F (36.7 C) 98.6 F (37 C) 98 F (36.7 C)  TempSrc:      SpO2: 98% 98% 99% 97%  Weight:        Intake/Output Summary (Last 24 hours) at 12/08/2024 1143 Last data filed at 12/07/2024 2153 Gross per 24 hour  Intake 300 ml  Output 0 ml  Net 300 ml   Filed Weights   12/05/24 2100  Weight: 65 kg    Examination:  General exam: Appears calm and comfortable, deconditioned, not in any acute distress. Respiratory system: CTA Bilaterally. Respiratory effort normal.  RR 16 Cardiovascular system: S1 & S2 heard, RRR. No JVD, murmurs, rubs, gallops or clicks.  Gastrointestinal system: Abdomen is non distended, soft and non tender. Normal bowel sounds heard. Central nervous system: Alert and oriented x 3.  No focal neurological deficits. Extremities: No edema, no cyanosis, no clubbing. Skin: No rashes, lesions or ulcers Psychiatry: Judgement and insight appear normal. Mood & affect appropriate.   Data Reviewed: I have personally reviewed following labs and imaging studies  CBC: Recent Labs  Lab 12/03/24 2016 12/03/24 2027 12/04/24 0530 12/06/24 0613 12/07/24 0410 12/08/24 0333  WBC 8.1  --  8.8 20.3* 16.4* 10.7*  NEUTROABS 6.0  --   --  18.0* 14.4* 8.5*  HGB 13.2 12.9  13.6 12.1 11.4* 10.6* 10.5*  HCT 39.2 38.0  40.0 36.4 33.4* 30.2* 30.1*  MCV 92.5  --  93.6 92.0 90.1 90.4  PLT 326  --  285 289 293 308   Basic Metabolic Panel: Recent Labs  Lab 12/04/24 0530 12/05/24 1117 12/06/24 0613 12/07/24 0410 12/08/24 0333  NA 135 139 133* 137 138  K 4.9 4.5 4.0 3.8 3.8  CL 102 104 100 103 105  CO2 23 20* 22 25 25   GLUCOSE 82 50* 138* 111* 97  BUN 21 14 15 11 9   CREATININE 1.18* 1.02* 1.03* 0.85 0.80  CALCIUM   8.3* 8.4* 7.9* 7.9* 8.0*  MG 2.1 1.9 1.9 2.0 2.1  PHOS 3.9  --   --   --   --    GFR: Estimated Creatinine Clearance: 55.8 mL/min (by C-G formula based on SCr of 0.8 mg/dL). Liver Function Tests: Recent Labs  Lab 12/03/24 2016  AST 32  ALT 24  ALKPHOS 73  BILITOT 0.4  PROT 6.8  ALBUMIN 3.7   No results for input(s): LIPASE, AMYLASE in the last 168 hours. Recent Labs  Lab 12/03/24 2018  AMMONIA 23   Coagulation Profile: Recent Labs  Lab 12/03/24 2016  INR 1.0   Cardiac Enzymes: No results for input(s): CKTOTAL, CKMB, CKMBINDEX, TROPONINI in the last 168 hours. BNP (last 3 results) Recent Labs    12/03/24 2016  PROBNP <50.0   HbA1C: No results for input(s): HGBA1C in the last 72 hours. CBG: Recent Labs  Lab 12/06/24 0918  GLUCAP 130*   Lipid Profile: No results for input(s): CHOL, HDL, LDLCALC, TRIG, CHOLHDL, LDLDIRECT in the last 72 hours. Thyroid Function Tests: No results for input(s): TSH, T4TOTAL,  FREET4, T3FREE, THYROIDAB in the last 72 hours. Anemia Panel: No results for input(s): VITAMINB12, FOLATE, FERRITIN, TIBC, IRON, RETICCTPCT in the last 72 hours. Sepsis Labs: Recent Labs  Lab 12/03/24 2027 12/03/24 2255 12/06/24 2320  LATICACIDVEN 1.2 1.3 1.1    Recent Results (from the past 240 hours)  Blood Culture (routine x 2)     Status: Abnormal   Collection Time: 12/03/24  8:16 PM   Specimen: BLOOD  Result Value Ref Range Status   Specimen Description BLOOD RIGHT ANTECUBITAL  Final   Special Requests   Final    BOTTLES DRAWN AEROBIC AND ANAEROBIC Blood Culture adequate volume   Culture  Setup Time   Final    GRAM POSITIVE COCCI AEROBIC BOTTLE ONLY CRITICAL RESULT CALLED TO, READ BACK BY AND VERIFIED WITH: PHARMD G ABBOTT 12/05/2024 @ 0400 BY AB    Culture (A)  Final    STAPHYLOCOCCUS HOMINIS THE SIGNIFICANCE OF ISOLATING THIS ORGANISM FROM A SINGLE SET OF BLOOD CULTURES WHEN MULTIPLE SETS ARE DRAWN IS UNCERTAIN. PLEASE NOTIFY THE MICROBIOLOGY DEPARTMENT WITHIN ONE WEEK IF SPECIATION AND SENSITIVITIES ARE REQUIRED. Performed at [facility], [address] [place], [place] [address]    Report Status 12/06/2024 FINAL  Final  Blood Culture ID Panel (Reflexed)     Status: Abnormal   Collection Time: 12/03/24  8:16 PM  Result Value Ref Range Status   Enterococcus faecalis NOT DETECTED NOT DETECTED Final   Enterococcus Faecium NOT DETECTED NOT DETECTED Final   Listeria monocytogenes NOT DETECTED NOT DETECTED Final   Staphylococcus species DETECTED (A) NOT DETECTED Final    Comment: CRITICAL RESULT CALLED TO, READ BACK BY AND VERIFIED WITH: PHARMD G ABBOTT 12/05/2024 @ 0400 BY AB    Staphylococcus aureus (BCID) NOT DETECTED NOT DETECTED Final   Staphylococcus epidermidis NOT DETECTED NOT DETECTED Final   Staphylococcus lugdunensis NOT DETECTED NOT DETECTED Final   Streptococcus species NOT DETECTED NOT DETECTED Final   Streptococcus agalactiae NOT  DETECTED NOT DETECTED Final   Streptococcus pneumoniae NOT DETECTED NOT DETECTED Final   Streptococcus pyogenes NOT DETECTED NOT DETECTED Final   A.calcoaceticus-baumannii NOT DETECTED NOT DETECTED Final   Bacteroides fragilis NOT DETECTED NOT DETECTED Final   Enterobacterales NOT DETECTED NOT DETECTED Final   Enterobacter cloacae complex NOT DETECTED NOT DETECTED Final   Escherichia coli NOT DETECTED NOT DETECTED Final   Klebsiella  aerogenes NOT DETECTED NOT DETECTED Final   Klebsiella oxytoca NOT DETECTED NOT DETECTED Final   Klebsiella pneumoniae NOT DETECTED NOT DETECTED Final   Proteus species NOT DETECTED NOT DETECTED Final   Salmonella species NOT DETECTED NOT DETECTED Final   Serratia marcescens NOT DETECTED NOT DETECTED Final   Haemophilus influenzae NOT DETECTED NOT DETECTED Final   Neisseria meningitidis NOT DETECTED NOT DETECTED Final   Pseudomonas aeruginosa NOT DETECTED NOT DETECTED Final   Stenotrophomonas maltophilia NOT DETECTED NOT DETECTED Final   Candida albicans NOT DETECTED NOT DETECTED Final   Candida auris NOT DETECTED NOT DETECTED Final   Candida glabrata NOT DETECTED NOT DETECTED Final   Candida krusei NOT DETECTED NOT DETECTED Final   Candida parapsilosis NOT DETECTED NOT DETECTED Final   Candida tropicalis NOT DETECTED NOT DETECTED Final   Cryptococcus neoformans/gattii NOT DETECTED NOT DETECTED Final    Comment: Performed at [facility], [address], [place], [place] [address]  Resp panel by RT-PCR (RSV, Flu A&B, Covid)     Status: None   Collection Time: 12/03/24  8:17 PM   Specimen: Nasal Swab  Result Value Ref Range Status   SARS Coronavirus 2 by RT PCR NEGATIVE NEGATIVE Final   Influenza A by PCR NEGATIVE NEGATIVE Final   Influenza B by PCR NEGATIVE NEGATIVE Final    Comment: (NOTE) The Xpert Xpress SARS-CoV-2/FLU/RSV plus assay is intended as an aid in the diagnosis of influenza from Nasopharyngeal swab specimens and should not be used  as a sole basis for treatment. Nasal washings and aspirates are unacceptable for Xpert Xpress SARS-CoV-2/FLU/RSV testing.  Fact Sheet for Patients: bloggercourse.com  Fact Sheet for Healthcare Providers: seriousbroker.it  This test is not yet approved or cleared by the [place]  FDA and has been authorized for detection and/or diagnosis of SARS-CoV-2 by FDA under an Emergency Use Authorization (EUA). This EUA will remain in effect (meaning this test can be used) for the duration of the COVID-19 declaration under Section 564(b)(1) of the Act, 21 U.S.C. section 360bbb-3(b)(1), unless the authorization is terminated or revoked.     Resp Syncytial Virus by PCR NEGATIVE NEGATIVE Final    Comment: (NOTE) Fact Sheet for Patients: bloggercourse.com  Fact Sheet for Healthcare Providers: seriousbroker.it  This test is not yet approved or cleared by the [place]  FDA and has been authorized for detection and/or diagnosis of SARS-CoV-2 by FDA under an Emergency Use Authorization (EUA). This EUA will remain in effect (meaning this test can be used) for the duration of the COVID-19 declaration under Section 564(b)(1) of the Act, 21 U.S.C. section 360bbb-3(b)(1), unless the authorization is terminated or revoked.  Performed at [facility], [address], [place], [place] [address]   Blood Culture (routine x 2)     Status: None   Collection Time: 12/03/24  9:29 PM   Specimen: BLOOD RIGHT HAND  Result Value Ref Range Status   Specimen Description BLOOD RIGHT HAND  Final   Special Requests   Final    BOTTLES DRAWN AEROBIC AND ANAEROBIC Blood Culture adequate volume   Culture   Final    NO GROWTH 5 DAYS Performed at [facility], [address], [place], [place] [address]    Report Status 12/08/2024 FINAL  Final  Urine Culture     Status: Abnormal   Collection Time:  12/03/24 11:04 PM   Specimen: Urine, Catheterized  Result Value Ref Range Status   Specimen Description URINE, CATHETERIZED  Final   Special Requests NONE  Final   Culture (A)  Final    >=100,000 COLONIES/mL ENTEROCOCCUS FAECALIS >=100,000 COLONIES/mL LACTOBACILLUS SPECIES Standardized susceptibility testing for this organism is not available. Performed at [facility], [address], [place], [place] [address]    Report Status 12/06/2024 FINAL  Final   Organism ID, Bacteria ENTEROCOCCUS FAECALIS (A)  Final      Susceptibility   Enterococcus faecalis - MIC*    AMPICILLIN  <=2 SENSITIVE Sensitive     NITROFURANTOIN <=16 SENSITIVE Sensitive     VANCOMYCIN 1 SENSITIVE Sensitive     * >=100,000 COLONIES/mL ENTEROCOCCUS FAECALIS  Culture, blood (Routine X 2) w Reflex to ID Panel     Status: None (Preliminary result)   Collection Time: 12/05/24 11:17 AM   Specimen: BLOOD RIGHT HAND  Result Value Ref Range Status   Specimen Description BLOOD RIGHT HAND  Final   Special Requests   Final    BOTTLES DRAWN AEROBIC AND ANAEROBIC Blood Culture results may not be optimal due to an inadequate volume of blood received in culture bottles   Culture   Final    NO GROWTH 3 DAYS Performed at [facility], [address], [place], [place] [address]    Report Status PENDING  Incomplete  Culture, blood (Routine X 2) w Reflex to ID Panel     Status: None (Preliminary result)   Collection Time: 12/05/24 11:17 AM   Specimen: BLOOD RIGHT ARM  Result Value Ref Range Status   Specimen Description BLOOD RIGHT ARM  Final   Special Requests   Final    BOTTLES DRAWN AEROBIC AND ANAEROBIC Blood Culture results may not be optimal due to an inadequate volume of blood received in culture bottles   Culture   Final    NO GROWTH 3 DAYS Performed at [facility], [address], [place], [place] [address]    Report Status PENDING  Incomplete   Radiology Studies: No results found.  Scheduled  Meds:  baclofen   30 mg Oral TID   Chlorhexidine  Gluconate Cloth  6 each Topical Daily   enoxaparin  (LOVENOX ) injection  30 mg Subcutaneous Q24H   linezolid   600 mg Oral Q12H   Continuous Infusions:   LOS: 4 days    Time spent: 35 mins   Darcel Dawley, MD [facility]   If 7PM-7AM, please contact night-coverage  "

## 2024-12-09 DIAGNOSIS — G9340 Encephalopathy, unspecified: Secondary | ICD-10-CM | POA: Diagnosis not present

## 2024-12-09 LAB — CBC WITH DIFFERENTIAL/PLATELET
Abs Immature Granulocytes: 0.07 K/uL (ref 0.00–0.07)
Basophils Absolute: 0 K/uL (ref 0.0–0.1)
Basophils Relative: 0 %
Eosinophils Absolute: 0.3 K/uL (ref 0.0–0.5)
Eosinophils Relative: 3 %
HCT: 30.8 % — ABNORMAL LOW (ref 36.0–46.0)
Hemoglobin: 10.5 g/dL — ABNORMAL LOW (ref 12.0–15.0)
Immature Granulocytes: 1 %
Lymphocytes Relative: 10 %
Lymphs Abs: 1 K/uL (ref 0.7–4.0)
MCH: 31.5 pg (ref 26.0–34.0)
MCHC: 34.1 g/dL (ref 30.0–36.0)
MCV: 92.5 fL (ref 80.0–100.0)
Monocytes Absolute: 0.8 K/uL (ref 0.1–1.0)
Monocytes Relative: 8 %
Neutro Abs: 7.4 K/uL (ref 1.7–7.7)
Neutrophils Relative %: 78 %
Platelets: 311 K/uL (ref 150–400)
RBC: 3.33 MIL/uL — ABNORMAL LOW (ref 3.87–5.11)
RDW: 12.6 % (ref 11.5–15.5)
WBC: 9.5 K/uL (ref 4.0–10.5)
nRBC: 0 % (ref 0.0–0.2)

## 2024-12-09 LAB — MAGNESIUM: Magnesium: 2.1 mg/dL (ref 1.7–2.4)

## 2024-12-09 LAB — BASIC METABOLIC PANEL WITH GFR
Anion gap: 10 (ref 5–15)
BUN: 9 mg/dL (ref 8–23)
CO2: 24 mmol/L (ref 22–32)
Calcium: 7.7 mg/dL — ABNORMAL LOW (ref 8.9–10.3)
Chloride: 105 mmol/L (ref 98–111)
Creatinine, Ser: 0.86 mg/dL (ref 0.44–1.00)
GFR, Estimated: >60 mL/min
Glucose, Bld: 98 mg/dL (ref 70–99)
Potassium: 3.9 mmol/L (ref 3.5–5.1)
Sodium: 138 mmol/L (ref 135–145)

## 2024-12-09 MED ORDER — ENOXAPARIN SODIUM 40 MG/0.4ML IJ SOSY
40.0000 mg | PREFILLED_SYRINGE | INTRAMUSCULAR | Status: DC
  Administered 2024-12-10 – 2024-12-14 (×5): 40 mg via SUBCUTANEOUS
  Filled 2024-12-09 (×6): qty 0.4

## 2024-12-09 NOTE — Progress Notes (Signed)
 " PROGRESS NOTE    Mary Villanueva  FMW:[id number] DOB: 09/11/1956 DOA: 12/03/2024 PCP: Larnell Hamilton, MD   Brief Narrative:  This 69 y.o. female with medical history significant for multiple sclerosis,  wheelchair-bound, history of MS for 40 years initially stable however progressively getting worse over last 10 years, chronic BLE weakness, gait abnormality, impaired mobility, spasticity, depression, anemia, anxiety, overactive bladder, CKD stage IIIa, hypothyroidism, and hypertension who presented from SNF for evaluation of altered mental status. Per EMS, since last evening, patient has been screaming in pain and has not been responding to staff as usual. They have attempted to give her pain medications and Ativan  to calm her down without any improvement in her mental status so EMS was called for evaluation. On EMS arrival, Patient was noted to have decreased respiration to around 6 and 7 with a period of apnea. 2 mg of Narcan administered with improvement in respiration.  At the bedside, patient constantly screaming but has a few moments of calmness. Per sister, patient has a foul-smelling urine and seems to be in pain.  Sister reports that patient is usually alert and oriented x 3 at baseline.  ED course :CT A/P with no acute intra-abdominal pathology. CT head with no acute intracranial abnormality. CTA chest PE study negative for PE.  She was found to have UTI and started on empiric antibiotics.  Patient admitted for further evaluation.  Assessment & Plan:   Principal Problem:   Acute encephalopathy Active Problems:   Disorientation   Generalized weakness   History of multiple sclerosis   Acute renal failure superimposed on stage 3a chronic kidney disease (HCC)  Acute encephalopathy >  resolved. Patient with history of MS presented from SNF due to altered mental status, screaming and not responding as usual.  Workup with labs and imaging overall nonrevealing but shows possible UTI.   Patient with continued screaming and agitation in the ED, resolved after receiving IV Toradol  and IV Dilaudid  for pain.   CT head without any intracranial abnormalities.  Ammonia levels, LFTs within normal limits, troponin flat. Treatment of UTI ongoing. Delirium precautions.   Acute cystitis: Leukocytosis: Patient with hx. of UTIs presented with altered mental status, and foul-smelling and cloudy urine.  Urinalysis shows possible signs of infection.  Blood cultures no growth to date.   Urine culture with greater than 100 K colonies of Enterococcus, lactobacillus. Continue empiric treatment with IV rocephin ; susceptibilities showed ampicillin  and vanco Leukocytosis likely due to UTI. Discussed with ID,  antibiotics can be changed with amoxicillin for 5 days.   AKI on CKD 3A: Creatinine slightly elevated to 1.5 on admission, now back closer to baseline of 1.0-1.2.  Likely secondary to mild dehydration. Renal Functions back to baseline.   Essential HTN: - BP soft, will hold medications for now.   Generalized weakness: Impaired mobility/gait abnormality: In the setting of her MS and acute urinary infection. Continue baclofen . PT/OT eval and treat. Fall precautions.   Chronic medical problems # Hypothyroidism # Overactive bladder # Multiple sclerosis # Anxiety and depression Continue Home medications.  DVT prophylaxis: Lovenox  Code Status: Full code Family Communication: No family at bed side. Disposition Plan:   Status is: Inpatient Remains inpatient appropriate because: Admitted for altered mental status likely secondary to UTI. Urine culture growing Enterococcus, Lactobacillus,  antibiotic can be changed to amoxicillin.  Patient is now medically ready for discharge pending SNF placement.   Consultants:  Infectious Diseases  Procedures: None  Antimicrobials:  Anti-infectives (From admission, onward)  Start     Dose/Rate Route Frequency Ordered Stop   12/06/24  2200  linezolid  (ZYVOX ) tablet 600 mg        600 mg Oral Every 12 hours 12/06/24 1213 12/11/24 0959   12/06/24 0145  linezolid  (ZYVOX ) IVPB 600 mg  Status:  Discontinued        600 mg 300 mL/hr over 60 Minutes Intravenous Every 12 hours 12/06/24 0056 12/06/24 1213   12/05/24 2300  Ampicillin -Sulbactam (UNASYN ) 3 g in sodium chloride  0.9 % 100 mL IVPB  Status:  Discontinued        3 g 200 mL/hr over 30 Minutes Intravenous Every 8 hours 12/05/24 2208 12/06/24 0055   12/04/24 2200  cefTRIAXone  (ROCEPHIN ) 1 g in sodium chloride  0.9 % 100 mL IVPB  Status:  Discontinued        1 g 200 mL/hr over 30 Minutes Intravenous Every 24 hours 12/04/24 0148 12/05/24 2208   12/03/24 2230  cefTRIAXone  (ROCEPHIN ) 2 g in sodium chloride  0.9 % 100 mL IVPB        2 g 200 mL/hr over 30 Minutes Intravenous  Once 12/03/24 2217 12/03/24 2311      Subjective: Patient was seen and examined at bedside.  Overnight events noted. Patient reports pain is much better.  She slept well last night. Does not want to have strong pain medications.  Objective: Vitals:   12/08/24 0816 12/08/24 1943 12/09/24 0515 12/09/24 0800  BP: (!) 118/44 (!) 95/46 (!) 102/48 (!) 97/51  Pulse: 83 88 79 81  Resp: 18  18   Temp: 98 F (36.7 C) 99 F (37.2 C) 98.3 F (36.8 C) 98.9 F (37.2 C)  TempSrc:  Oral Oral Oral  SpO2: 97% 98% 98% 93%  Weight:        Intake/Output Summary (Last 24 hours) at 12/09/2024 1145 Last data filed at 12/09/2024 0600 Gross per 24 hour  Intake 120 ml  Output 550 ml  Net -430 ml   Filed Weights   12/05/24 2100  Weight: 65 kg    Examination:  General exam: Appears calm and comfortable, deconditioned, not in any acute distress. Respiratory system: CTA Bilaterally. Respiratory effort normal.  RR 15 Cardiovascular system: S1 & S2 heard, RRR. No JVD, murmurs, rubs, gallops or clicks.  Gastrointestinal system: Abdomen is non distended, soft and non tender. Normal bowel sounds heard. Central nervous  system: Alert and oriented x 3. No focal neurological deficits. Extremities: No edema, no cyanosis, no clubbing. Skin: No rashes, lesions or ulcers Psychiatry: Judgement and insight appear normal. Mood & affect appropriate.   Data Reviewed: I have personally reviewed following labs and imaging studies  CBC: Recent Labs  Lab 12/03/24 2016 12/03/24 2027 12/04/24 0530 12/06/24 9386 12/07/24 0410 12/08/24 0333 12/09/24 0318  WBC 8.1  --  8.8 20.3* 16.4* 10.7* 9.5  NEUTROABS 6.0  --   --  18.0* 14.4* 8.5* 7.4  HGB 13.2   < > 12.1 11.4* 10.6* 10.5* 10.5*  HCT 39.2   < > 36.4 33.4* 30.2* 30.1* 30.8*  MCV 92.5  --  93.6 92.0 90.1 90.4 92.5  PLT 326  --  285 289 293 308 311   < > = values in this interval not displayed.   Basic Metabolic Panel: Recent Labs  Lab 12/04/24 0530 12/05/24 1117 12/06/24 0613 12/07/24 0410 12/08/24 0333 12/09/24 0318  NA 135 139 133* 137 138 138  K 4.9 4.5 4.0 3.8 3.8 3.9  CL 102 104 100 103  105 105  CO2 23 20* 22 25 25 24   GLUCOSE 82 50* 138* 111* 97 98  BUN 21 14 15 11 9 9   CREATININE 1.18* 1.02* 1.03* 0.85 0.80 0.86  CALCIUM  8.3* 8.4* 7.9* 7.9* 8.0* 7.7*  MG 2.1 1.9 1.9 2.0 2.1 2.1  PHOS 3.9  --   --   --   --   --    GFR: Estimated Creatinine Clearance: 51.9 mL/min (by C-G formula based on SCr of 0.86 mg/dL). Liver Function Tests: Recent Labs  Lab 12/03/24 2016  AST 32  ALT 24  ALKPHOS 73  BILITOT 0.4  PROT 6.8  ALBUMIN 3.7   No results for input(s): LIPASE, AMYLASE in the last 168 hours. Recent Labs  Lab 12/03/24 2018  AMMONIA 23   Coagulation Profile: Recent Labs  Lab 12/03/24 2016  INR 1.0   Cardiac Enzymes: No results for input(s): CKTOTAL, CKMB, CKMBINDEX, TROPONINI in the last 168 hours. BNP (last 3 results) Recent Labs    12/03/24 2016  PROBNP <50.0   HbA1C: No results for input(s): HGBA1C in the last 72 hours. CBG: Recent Labs  Lab 12/06/24 0918  GLUCAP 130*   Lipid Profile: No results  for input(s): CHOL, HDL, LDLCALC, TRIG, CHOLHDL, LDLDIRECT in the last 72 hours. Thyroid Function Tests: No results for input(s): TSH, T4TOTAL, FREET4, T3FREE, THYROIDAB in the last 72 hours. Anemia Panel: No results for input(s): VITAMINB12, FOLATE, FERRITIN, TIBC, IRON, RETICCTPCT in the last 72 hours. Sepsis Labs: Recent Labs  Lab 12/03/24 2027 12/03/24 2255 12/06/24 2320  LATICACIDVEN 1.2 1.3 1.1    Recent Results (from the past 240 hours)  Blood Culture (routine x 2)     Status: Abnormal   Collection Time: 12/03/24  8:16 PM   Specimen: BLOOD  Result Value Ref Range Status   Specimen Description BLOOD RIGHT ANTECUBITAL  Final   Special Requests   Final    BOTTLES DRAWN AEROBIC AND ANAEROBIC Blood Culture adequate volume   Culture  Setup Time   Final    GRAM POSITIVE COCCI AEROBIC BOTTLE ONLY CRITICAL RESULT CALLED TO, READ BACK BY AND VERIFIED WITH: PHARMD G ABBOTT 12/05/2024 @ 0400 BY AB    Culture (A)  Final    STAPHYLOCOCCUS HOMINIS THE SIGNIFICANCE OF ISOLATING THIS ORGANISM FROM A SINGLE SET OF BLOOD CULTURES WHEN MULTIPLE SETS ARE DRAWN IS UNCERTAIN. PLEASE NOTIFY THE MICROBIOLOGY DEPARTMENT WITHIN ONE WEEK IF SPECIATION AND SENSITIVITIES ARE REQUIRED. Performed at [facility], [address], [place], [place] [address]    Report Status 12/06/2024 FINAL  Final  Blood Culture ID Panel (Reflexed)     Status: Abnormal   Collection Time: 12/03/24  8:16 PM  Result Value Ref Range Status   Enterococcus faecalis NOT DETECTED NOT DETECTED Final   Enterococcus Faecium NOT DETECTED NOT DETECTED Final   Listeria monocytogenes NOT DETECTED NOT DETECTED Final   Staphylococcus species DETECTED (A) NOT DETECTED Final    Comment: CRITICAL RESULT CALLED TO, READ BACK BY AND VERIFIED WITH: PHARMD G ABBOTT 12/05/2024 @ 0400 BY AB    Staphylococcus aureus (BCID) NOT DETECTED NOT DETECTED Final   Staphylococcus epidermidis NOT DETECTED NOT  DETECTED Final   Staphylococcus lugdunensis NOT DETECTED NOT DETECTED Final   Streptococcus species NOT DETECTED NOT DETECTED Final   Streptococcus agalactiae NOT DETECTED NOT DETECTED Final   Streptococcus pneumoniae NOT DETECTED NOT DETECTED Final   Streptococcus pyogenes NOT DETECTED NOT DETECTED Final   A.calcoaceticus-baumannii NOT DETECTED NOT  DETECTED Final   Bacteroides fragilis NOT DETECTED NOT DETECTED Final   Enterobacterales NOT DETECTED NOT DETECTED Final   Enterobacter cloacae complex NOT DETECTED NOT DETECTED Final   Escherichia coli NOT DETECTED NOT DETECTED Final   Klebsiella aerogenes NOT DETECTED NOT DETECTED Final   Klebsiella oxytoca NOT DETECTED NOT DETECTED Final   Klebsiella pneumoniae NOT DETECTED NOT DETECTED Final   Proteus species NOT DETECTED NOT DETECTED Final   Salmonella species NOT DETECTED NOT DETECTED Final   Serratia marcescens NOT DETECTED NOT DETECTED Final   Haemophilus influenzae NOT DETECTED NOT DETECTED Final   Neisseria meningitidis NOT DETECTED NOT DETECTED Final   Pseudomonas aeruginosa NOT DETECTED NOT DETECTED Final   Stenotrophomonas maltophilia NOT DETECTED NOT DETECTED Final   Candida albicans NOT DETECTED NOT DETECTED Final   Candida auris NOT DETECTED NOT DETECTED Final   Candida glabrata NOT DETECTED NOT DETECTED Final   Candida krusei NOT DETECTED NOT DETECTED Final   Candida parapsilosis NOT DETECTED NOT DETECTED Final   Candida tropicalis NOT DETECTED NOT DETECTED Final   Cryptococcus neoformans/gattii NOT DETECTED NOT DETECTED Final    Comment: Performed at [facility], [address] [place], [place] [address]  Resp panel by RT-PCR (RSV, Flu A&B, Covid)     Status: None   Collection Time: 12/03/24  8:17 PM   Specimen: Nasal Swab  Result Value Ref Range Status   SARS Coronavirus 2 by RT PCR NEGATIVE NEGATIVE Final   Influenza A by PCR NEGATIVE NEGATIVE Final   Influenza B by PCR NEGATIVE NEGATIVE Final    Comment:  (NOTE) The Xpert Xpress SARS-CoV-2/FLU/RSV plus assay is intended as an aid in the diagnosis of influenza from Nasopharyngeal swab specimens and should not be used as a sole basis for treatment. Nasal washings and aspirates are unacceptable for Xpert Xpress SARS-CoV-2/FLU/RSV testing.  Fact Sheet for Patients: bloggercourse.com  Fact Sheet for Healthcare Providers: seriousbroker.it  This test is not yet approved or cleared by the [place]  FDA and has been authorized for detection and/or diagnosis of SARS-CoV-2 by FDA under an Emergency Use Authorization (EUA). This EUA will remain in effect (meaning this test can be used) for the duration of the COVID-19 declaration under Section 564(b)(1) of the Act, 21 U.S.C. section 360bbb-3(b)(1), unless the authorization is terminated or revoked.     Resp Syncytial Virus by PCR NEGATIVE NEGATIVE Final    Comment: (NOTE) Fact Sheet for Patients: bloggercourse.com  Fact Sheet for Healthcare Providers: seriousbroker.it  This test is not yet approved or cleared by the [place]  FDA and has been authorized for detection and/or diagnosis of SARS-CoV-2 by FDA under an Emergency Use Authorization (EUA). This EUA will remain in effect (meaning this test can be used) for the duration of the COVID-19 declaration under Section 564(b)(1) of the Act, 21 U.S.C. section 360bbb-3(b)(1), unless the authorization is terminated or revoked.  Performed at [facility], [address] [place], [place] [address]   Blood Culture (routine x 2)     Status: None   Collection Time: 12/03/24  9:29 PM   Specimen: BLOOD RIGHT HAND  Result Value Ref Range Status   Specimen Description BLOOD RIGHT HAND  Final   Special Requests   Final    BOTTLES DRAWN AEROBIC AND ANAEROBIC Blood Culture adequate volume   Culture   Final    NO GROWTH 5 DAYS Performed  at [facility], [address], [place], [place] [address]  Report Status 12/08/2024 FINAL  Final  Urine Culture     Status: Abnormal   Collection Time: 12/03/24 11:04 PM   Specimen: Urine, Catheterized  Result Value Ref Range Status   Specimen Description URINE, CATHETERIZED  Final   Special Requests NONE  Final   Culture (A)  Final    >=100,000 COLONIES/mL ENTEROCOCCUS FAECALIS >=100,000 COLONIES/mL LACTOBACILLUS SPECIES Standardized susceptibility testing for this organism is not available. Performed at [facility], [address], [place], [place] [address]    Report Status 12/06/2024 FINAL  Final   Organism ID, Bacteria ENTEROCOCCUS FAECALIS (A)  Final      Susceptibility   Enterococcus faecalis - MIC*    AMPICILLIN  <=2 SENSITIVE Sensitive     NITROFURANTOIN <=16 SENSITIVE Sensitive     VANCOMYCIN 1 SENSITIVE Sensitive     * >=100,000 COLONIES/mL ENTEROCOCCUS FAECALIS  Culture, blood (Routine X 2) w Reflex to ID Panel     Status: None (Preliminary result)   Collection Time: 12/05/24 11:17 AM   Specimen: BLOOD RIGHT HAND  Result Value Ref Range Status   Specimen Description BLOOD RIGHT HAND  Final   Special Requests   Final    BOTTLES DRAWN AEROBIC AND ANAEROBIC Blood Culture results may not be optimal due to an inadequate volume of blood received in culture bottles   Culture   Final    NO GROWTH 4 DAYS Performed at [facility], [address], [place], [place] [address]    Report Status PENDING  Incomplete  Culture, blood (Routine X 2) w Reflex to ID Panel     Status: None (Preliminary result)   Collection Time: 12/05/24 11:17 AM   Specimen: BLOOD RIGHT ARM  Result Value Ref Range Status   Specimen Description BLOOD RIGHT ARM  Final   Special Requests   Final    BOTTLES DRAWN AEROBIC AND ANAEROBIC Blood Culture results may not be optimal due to an inadequate volume of blood received in culture bottles   Culture   Final    NO GROWTH 4  DAYS Performed at [facility], [address], [place], [place] [address]    Report Status PENDING  Incomplete   Radiology Studies: No results found.  Scheduled Meds:  baclofen   30 mg Oral TID   Chlorhexidine  Gluconate Cloth  6 each Topical Daily   [START ON 12/10/2024] enoxaparin  (LOVENOX ) injection  40 mg Subcutaneous Q24H   linezolid   600 mg Oral Q12H   Continuous Infusions:   LOS: 5 days    Time spent: 35 mins   Darcel Dawley, MD [facility]   If 7PM-7AM, please contact night-coverage  "

## 2024-12-09 NOTE — Progress Notes (Signed)
 Occupational Therapy Treatment Patient Details Name: Mary Villanueva MRN: [id number] DOB: 1955-12-12 Today's Date: 12/09/2024   History of present illness Pt is a 69 y.o. female admitted 1/9 with acute encephalopathy. PMH:  MS for 40 years initially stable however progressively getting worse over last 10 years, chronic BLE weakness, spasticity, depression, anemia, anxiety, overactive bladder, CKD stage IIIa, hypothyroidism, HTN   OT comments  Pt limited by pain in B knees, especially R knee. Pt with B knee flexion contractures and unable to tolerate any PROM to R knee. Session focused on bed mobility/rolling requiring max A to sit EOB with min/CGA for balance, bed level grooming/hygiene and UB ADLs sup-min A. OT will continue to follow acutely to maximize level of function and safety       If plan is discharge home, recommend the following:  A lot of help with bathing/dressing/bathroom;Two people to help with walking and/or transfers;Assist for transportation   Equipment Recommendations  None recommended by OT    Recommendations for Other Services      Precautions / Restrictions Precautions Precautions: Fall Recall of Precautions/Restrictions: Intact Restrictions Weight Bearing Restrictions Per Provider Order: Yes Other Position/Activity Restrictions: R leg drawn up into flexion, contracted.  Very painful.       Mobility Bed Mobility Overal bed mobility: Needs Assistance   Rolling: Total assist   Supine to sit: Max assist     General bed mobility comments: pain to LE's with movement, especially R knee    Transfers Overall transfer level: Needs assistance                 General transfer comment: NT, will need mechanical lift     Balance Overall balance assessment: Needs assistance   Sitting balance-Leahy Scale: Poor Sitting balance - Comments: CGA-minA Postural control: Posterior lean                                 ADL either performed  or assessed with clinical judgement   ADL Overall ADL's : Needs assistance/impaired     Grooming: Wash/dry hands;Wash/dry face;Set up;Bed level           Upper Body Dressing : Minimal assistance;Bed level           Toileting- Clothing Manipulation and Hygiene: Total assistance;Bed level         General ADL Comments: pt reports that ~1 month ago she was able to SPT to commode, has been at [facility] since last hospital stay 12/25    Extremity/Trunk Assessment Upper Extremity Assessment Upper Extremity Assessment: Generalized weakness;Right hand dominant   Lower Extremity Assessment Lower Extremity Assessment: Defer to PT evaluation        Vision Baseline Vision/History: 1 Wears glasses Ability to See in Adequate Light: 0 Adequate Patient Visual Report: No change from baseline     Perception     Praxis     Communication Communication Communication: Impaired   Cognition Arousal: Alert Behavior During Therapy: WFL for tasks assessed/performed Cognition: No apparent impairments                               Following commands: Intact        Cueing   Cueing Techniques: Verbal cues  Exercises      Shoulder Instructions       General Comments      Pertinent Vitals/ Pain  Pain Assessment Pain Assessment: Faces Faces Pain Scale: Hurts even more Pain Location: R knee Pain Descriptors / Indicators: Grimacing, Moaning Pain Intervention(s): Limited activity within patient's tolerance, Monitored during session, Repositioned  Home Living                                          Prior Functioning/Environment              Frequency  Min 2X/week        Progress Toward Goals  OT Goals(current goals can now be found in the care plan section)  Progress towards OT goals: OT to reassess next treatment  ADL Goals Pt Will Perform Grooming: with mod assist;with min assist;sitting Pt Will Perform Upper Body Bathing:  with mod assist;with min assist Pt Will Perform Upper Body Dressing: with mod assist;with min assist;sitting Additional ADL Goal #1: Pt will complete bed mobility max-mod A to sit EOB in prep for grooming and UB ADL tasks  Plan      Co-evaluation                 AM-PAC OT 6 Clicks Daily Activity     Outcome Measure   Help from another person eating meals?: A Little Help from another person taking care of personal grooming?: A Little Help from another person toileting, which includes using toliet, bedpan, or urinal?: Total Help from another person bathing (including washing, rinsing, drying)?: A Lot Help from another person to put on and taking off regular upper body clothing?: A Little Help from another person to put on and taking off regular lower body clothing?: Total 6 Click Score: 13    End of Session    OT Visit Diagnosis: Muscle weakness (generalized) (M62.81);Pain Pain - Right/Left:  (bilaterally) Pain - part of body: Knee;Leg (R knee more painful than L)   Activity Tolerance Patient limited by pain   Patient Left in bed;with call bell/phone within reach   Nurse Communication Mobility status        Time: 1043-1100 OT Time Calculation (min): 17 min  Charges: OT General Charges $OT Visit: 1 Visit OT Treatments $Self Care/Home Management : 8-22 mins    Jacques Karna Loose 12/09/2024, 12:20 PM

## 2024-12-09 NOTE — Plan of Care (Signed)

## 2024-12-09 NOTE — Plan of Care (Signed)

## 2024-12-10 DIAGNOSIS — G9340 Encephalopathy, unspecified: Secondary | ICD-10-CM | POA: Diagnosis not present

## 2024-12-10 LAB — CBC WITH DIFFERENTIAL/PLATELET
Abs Immature Granulocytes: 0.09 K/uL — ABNORMAL HIGH (ref 0.00–0.07)
Basophils Absolute: 0 K/uL (ref 0.0–0.1)
Basophils Relative: 0 %
Eosinophils Absolute: 0.2 K/uL (ref 0.0–0.5)
Eosinophils Relative: 2 %
HCT: 32.2 % — ABNORMAL LOW (ref 36.0–46.0)
Hemoglobin: 11.1 g/dL — ABNORMAL LOW (ref 12.0–15.0)
Immature Granulocytes: 1 %
Lymphocytes Relative: 11 %
Lymphs Abs: 1.1 K/uL (ref 0.7–4.0)
MCH: 31.5 pg (ref 26.0–34.0)
MCHC: 34.5 g/dL (ref 30.0–36.0)
MCV: 91.5 fL (ref 80.0–100.0)
Monocytes Absolute: 0.8 K/uL (ref 0.1–1.0)
Monocytes Relative: 7 %
Neutro Abs: 8.2 K/uL — ABNORMAL HIGH (ref 1.7–7.7)
Neutrophils Relative %: 79 %
Platelets: 323 K/uL (ref 150–400)
RBC: 3.52 MIL/uL — ABNORMAL LOW (ref 3.87–5.11)
RDW: 12.6 % (ref 11.5–15.5)
WBC: 10.4 K/uL (ref 4.0–10.5)
nRBC: 0 % (ref 0.0–0.2)

## 2024-12-10 LAB — CULTURE, BLOOD (ROUTINE X 2)
Culture: NO GROWTH
Culture: NO GROWTH

## 2024-12-10 LAB — BASIC METABOLIC PANEL WITH GFR
Anion gap: 8 (ref 5–15)
BUN: 8 mg/dL (ref 8–23)
CO2: 25 mmol/L (ref 22–32)
Calcium: 7.8 mg/dL — ABNORMAL LOW (ref 8.9–10.3)
Chloride: 103 mmol/L (ref 98–111)
Creatinine, Ser: 0.86 mg/dL (ref 0.44–1.00)
GFR, Estimated: >60 mL/min
Glucose, Bld: 108 mg/dL — ABNORMAL HIGH (ref 70–99)
Potassium: 3.9 mmol/L (ref 3.5–5.1)
Sodium: 136 mmol/L (ref 135–145)

## 2024-12-10 LAB — MAGNESIUM: Magnesium: 2.1 mg/dL (ref 1.7–2.4)

## 2024-12-10 MED ORDER — ORAL CARE MOUTH RINSE: 15.0000 mL | OROMUCOSAL | Status: DC | PRN

## 2024-12-10 NOTE — Plan of Care (Signed)

## 2024-12-10 NOTE — Progress Notes (Signed)
 " PROGRESS NOTE    Mary Villanueva  FMW:[id number] DOB: [birth year] DOA: 12/03/2024 PCP: Larnell Hamilton, MD   Brief Narrative:  This 69 y.o. female with medical history significant for multiple sclerosis,  wheelchair-bound, history of MS for 40 years initially stable however progressively getting worse over last 10 years, chronic BLE weakness, gait abnormality, impaired mobility, spasticity, depression, anemia, anxiety, overactive bladder, CKD stage IIIa, hypothyroidism, and hypertension who presented from SNF for evaluation of altered mental status. Per EMS, since last evening, patient has been screaming in pain and has not been responding to staff as usual. They have attempted to give her pain medications and Ativan  to calm her down without any improvement in her mental status so EMS was called for evaluation. On EMS arrival, Patient was noted to have decreased respiration to around 6 and 7 with a period of apnea. 2 mg of Narcan administered with improvement in respiration.  At the bedside, patient constantly screaming but has a few moments of calmness. Per sister, patient has a foul-smelling urine and seems to be in pain.  Sister reports that patient is usually alert and oriented x 3 at baseline.  ED course :CT A/P with no acute intra-abdominal pathology. CT head with no acute intracranial abnormality. CTA chest PE study negative for PE.  She was found to have UTI and started on empiric antibiotics.  Patient admitted for further evaluation.  Assessment & Plan:   Principal Problem:   Acute encephalopathy Active Problems:   Disorientation   Generalized weakness   History of multiple sclerosis   Acute renal failure superimposed on stage 3a chronic kidney disease (HCC)  Acute encephalopathy >  resolved. Patient with history of MS presented from SNF due to altered mental status, screaming and not responding as usual.  Workup with labs and imaging overall nonrevealing but shows possible UTI.   Patient with continued screaming and agitation in the ED, resolved after receiving IV Toradol  and IV Dilaudid  for pain.   CT head without any intracranial abnormalities.  Ammonia levels, LFTs within normal limits, troponin flat. Treatment of UTI ongoing. Delirium precautions.   Acute cystitis: Leukocytosis: Patient with hx. of UTIs presented with altered mental status, and foul-smelling and cloudy urine.  Urinalysis shows possible signs of infection.  Blood cultures no growth to date.   Urine culture with greater than 100 K colonies of Enterococcus, lactobacillus. Continue empiric treatment with IV rocephin ; susceptibilities showed ampicillin  and vanco Leukocytosis likely due to UTI. Discussed with ID,  antibiotics can be changed with amoxicillin for 5 days.   AKI on CKD 3A: Creatinine slightly elevated to 1.5 on admission, now back closer to baseline of 1.0-1.2.  Likely secondary to mild dehydration. Renal Functions back to baseline.   Essential HTN: - BP soft, will hold medications for now.   Generalized weakness: Impaired mobility/gait abnormality: In the setting of her MS and acute urinary infection. Continue baclofen . PT/OT recommended SNF Fall precautions.   Chronic medical problems # Hypothyroidism: # Overactive bladder: # Multiple sclerosis: # Anxiety and depression: Continue Home medications.  DVT prophylaxis: Lovenox  Code Status: Full code Family Communication: No family at bed side. Disposition Plan:   Status is: Inpatient Remains inpatient appropriate because: Admitted for altered mental status likely secondary to UTI. Urine culture growing Enterococcus, Lactobacillus,  antibiotics can be changed to amoxicillin.  Patient is now medically ready for discharge pending SNF placement.   Consultants:  Infectious Diseases  Procedures: None  Antimicrobials:  Anti-infectives (From admission, onward)  Start     Dose/Rate Route Frequency Ordered Stop    12/06/24 2200  linezolid  (ZYVOX ) tablet 600 mg        600 mg Oral Every 12 hours 12/06/24 1213 12/11/24 0959   12/06/24 0145  linezolid  (ZYVOX ) IVPB 600 mg  Status:  Discontinued        600 mg 300 mL/hr over 60 Minutes Intravenous Every 12 hours 12/06/24 0056 12/06/24 1213   12/05/24 2300  Ampicillin -Sulbactam (UNASYN ) 3 g in sodium chloride  0.9 % 100 mL IVPB  Status:  Discontinued        3 g 200 mL/hr over 30 Minutes Intravenous Every 8 hours 12/05/24 2208 12/06/24 0055   12/04/24 2200  cefTRIAXone  (ROCEPHIN ) 1 g in sodium chloride  0.9 % 100 mL IVPB  Status:  Discontinued        1 g 200 mL/hr over 30 Minutes Intravenous Every 24 hours 12/04/24 0148 12/05/24 2208   12/03/24 2230  cefTRIAXone  (ROCEPHIN ) 2 g in sodium chloride  0.9 % 100 mL IVPB        2 g 200 mL/hr over 30 Minutes Intravenous  Once 12/03/24 2217 12/03/24 2311      Subjective: Patient was seen and examined at bedside.  Overnight events noted. Patient reports pain is much better.  Denies any other concerns. Does not want to have strong pain medications.  Objective: Vitals:   12/09/24 1658 12/09/24 2027 12/10/24 0525 12/10/24 0846  BP: (!) 102/37 (!) 88/42 (!) 109/48 (!) 117/42  Pulse: 87 91 88 87  Resp: 18 18 18  (!) 22  Temp: 98.2 F (36.8 C) 99.9 F (37.7 C) 99.9 F (37.7 C) 99.8 F (37.7 C)  TempSrc:    Oral  SpO2: 98% 97% 93% 94%  Weight:        Intake/Output Summary (Last 24 hours) at 12/10/2024 1253 Last data filed at 12/10/2024 0900 Gross per 24 hour  Intake 0 ml  Output 1000 ml  Net -1000 ml   Filed Weights   12/05/24 2100  Weight: 65 kg    Examination:  General exam: Appears calm and comfortable, deconditioned, not in any acute distress. Respiratory system: CTA Bilaterally. Respiratory effort normal.  RR 14 Cardiovascular system: S1 & S2 heard, RRR. No JVD, murmurs, rubs, gallops or clicks.  Gastrointestinal system: Abdomen is non distended, soft and non tender. Normal bowel sounds  heard. Central nervous system: Alert and oriented x 3. No focal neurological deficits. Extremities: No edema, no cyanosis, no clubbing. Skin: No rashes, lesions or ulcers Psychiatry: Judgement and insight appear normal. Mood & affect appropriate.   Data Reviewed: I have personally reviewed following labs and imaging studies  CBC: Recent Labs  Lab 12/06/24 0613 12/07/24 0410 12/08/24 0333 12/09/24 0318 12/10/24 0346  WBC 20.3* 16.4* 10.7* 9.5 10.4  NEUTROABS 18.0* 14.4* 8.5* 7.4 8.2*  HGB 11.4* 10.6* 10.5* 10.5* 11.1*  HCT 33.4* 30.2* 30.1* 30.8* 32.2*  MCV 92.0 90.1 90.4 92.5 91.5  PLT 289 293 308 311 323   Basic Metabolic Panel: Recent Labs  Lab 12/04/24 0530 12/05/24 1117 12/06/24 0613 12/07/24 0410 12/08/24 0333 12/09/24 0318 12/10/24 0346  NA 135   < > 133* 137 138 138 136  K 4.9   < > 4.0 3.8 3.8 3.9 3.9  CL 102   < > 100 103 105 105 103  CO2 23   < > 22 25 25 24 25   GLUCOSE 82   < > 138* 111* 97 98 108*  BUN 21   < >  15 11 9 9 8   CREATININE 1.18*   < > 1.03* 0.85 0.80 0.86 0.86  CALCIUM  8.3*   < > 7.9* 7.9* 8.0* 7.7* 7.8*  MG 2.1   < > 1.9 2.0 2.1 2.1 2.1  PHOS 3.9  --   --   --   --   --   --    < > = values in this interval not displayed.   GFR: Estimated Creatinine Clearance: 51.9 mL/min (by C-G formula based on SCr of 0.86 mg/dL). Liver Function Tests: Recent Labs  Lab 12/03/24 2016  AST 32  ALT 24  ALKPHOS 73  BILITOT 0.4  PROT 6.8  ALBUMIN 3.7   No results for input(s): LIPASE, AMYLASE in the last 168 hours. Recent Labs  Lab 12/03/24 2018  AMMONIA 23   Coagulation Profile: Recent Labs  Lab 12/03/24 2016  INR 1.0   Cardiac Enzymes: No results for input(s): CKTOTAL, CKMB, CKMBINDEX, TROPONINI in the last 168 hours. BNP (last 3 results) Recent Labs    12/03/24 2016  PROBNP <50.0   HbA1C: No results for input(s): HGBA1C in the last 72 hours. CBG: Recent Labs  Lab 12/06/24 0918  GLUCAP 130*   Lipid Profile: No  results for input(s): CHOL, HDL, LDLCALC, TRIG, CHOLHDL, LDLDIRECT in the last 72 hours. Thyroid Function Tests: No results for input(s): TSH, T4TOTAL, FREET4, T3FREE, THYROIDAB in the last 72 hours. Anemia Panel: No results for input(s): VITAMINB12, FOLATE, FERRITIN, TIBC, IRON, RETICCTPCT in the last 72 hours. Sepsis Labs: Recent Labs  Lab 12/03/24 2027 12/03/24 2255 12/06/24 2320  LATICACIDVEN 1.2 1.3 1.1    Recent Results (from the past 240 hours)  Blood Culture (routine x 2)     Status: Abnormal   Collection Time: 12/03/24  8:16 PM   Specimen: BLOOD  Result Value Ref Range Status   Specimen Description BLOOD RIGHT ANTECUBITAL  Final   Special Requests   Final    BOTTLES DRAWN AEROBIC AND ANAEROBIC Blood Culture adequate volume   Culture  Setup Time   Final    GRAM POSITIVE COCCI AEROBIC BOTTLE ONLY CRITICAL RESULT CALLED TO, READ BACK BY AND VERIFIED WITH: PHARMD G ABBOTT 12/05/2024 @ 0400 BY AB    Culture (A)  Final    STAPHYLOCOCCUS HOMINIS THE SIGNIFICANCE OF ISOLATING THIS ORGANISM FROM A SINGLE SET OF BLOOD CULTURES WHEN MULTIPLE SETS ARE DRAWN IS UNCERTAIN. PLEASE NOTIFY THE MICROBIOLOGY DEPARTMENT WITHIN ONE WEEK IF SPECIATION AND SENSITIVITIES ARE REQUIRED. Performed at [facility], [address], [place], [place] [address]    Report Status 12/06/2024 FINAL  Final  Blood Culture ID Panel (Reflexed)     Status: Abnormal   Collection Time: 12/03/24  8:16 PM  Result Value Ref Range Status   Enterococcus faecalis NOT DETECTED NOT DETECTED Final   Enterococcus Faecium NOT DETECTED NOT DETECTED Final   Listeria monocytogenes NOT DETECTED NOT DETECTED Final   Staphylococcus species DETECTED (A) NOT DETECTED Final    Comment: CRITICAL RESULT CALLED TO, READ BACK BY AND VERIFIED WITH: PHARMD G ABBOTT 12/05/2024 @ 0400 BY AB    Staphylococcus aureus (BCID) NOT DETECTED NOT DETECTED Final   Staphylococcus epidermidis NOT DETECTED  NOT DETECTED Final   Staphylococcus lugdunensis NOT DETECTED NOT DETECTED Final   Streptococcus species NOT DETECTED NOT DETECTED Final   Streptococcus agalactiae NOT DETECTED NOT DETECTED Final   Streptococcus pneumoniae NOT DETECTED NOT DETECTED Final   Streptococcus pyogenes NOT DETECTED NOT DETECTED Final  A.calcoaceticus-baumannii NOT DETECTED NOT DETECTED Final   Bacteroides fragilis NOT DETECTED NOT DETECTED Final   Enterobacterales NOT DETECTED NOT DETECTED Final   Enterobacter cloacae complex NOT DETECTED NOT DETECTED Final   Escherichia coli NOT DETECTED NOT DETECTED Final   Klebsiella aerogenes NOT DETECTED NOT DETECTED Final   Klebsiella oxytoca NOT DETECTED NOT DETECTED Final   Klebsiella pneumoniae NOT DETECTED NOT DETECTED Final   Proteus species NOT DETECTED NOT DETECTED Final   Salmonella species NOT DETECTED NOT DETECTED Final   Serratia marcescens NOT DETECTED NOT DETECTED Final   Haemophilus influenzae NOT DETECTED NOT DETECTED Final   Neisseria meningitidis NOT DETECTED NOT DETECTED Final   Pseudomonas aeruginosa NOT DETECTED NOT DETECTED Final   Stenotrophomonas maltophilia NOT DETECTED NOT DETECTED Final   Candida albicans NOT DETECTED NOT DETECTED Final   Candida auris NOT DETECTED NOT DETECTED Final   Candida glabrata NOT DETECTED NOT DETECTED Final   Candida krusei NOT DETECTED NOT DETECTED Final   Candida parapsilosis NOT DETECTED NOT DETECTED Final   Candida tropicalis NOT DETECTED NOT DETECTED Final   Cryptococcus neoformans/gattii NOT DETECTED NOT DETECTED Final    Comment: Performed at [facility], [address] [place], [place] [address]  Resp panel by RT-PCR (RSV, Flu A&B, Covid)     Status: None   Collection Time: 12/03/24  8:17 PM   Specimen: Nasal Swab  Result Value Ref Range Status   SARS Coronavirus 2 by RT PCR NEGATIVE NEGATIVE Final   Influenza A by PCR NEGATIVE NEGATIVE Final   Influenza B by PCR NEGATIVE NEGATIVE Final     Comment: (NOTE) The Xpert Xpress SARS-CoV-2/FLU/RSV plus assay is intended as an aid in the diagnosis of influenza from Nasopharyngeal swab specimens and should not be used as a sole basis for treatment. Nasal washings and aspirates are unacceptable for Xpert Xpress SARS-CoV-2/FLU/RSV testing.  Fact Sheet for Patients: bloggercourse.com  Fact Sheet for Healthcare Providers: seriousbroker.it  This test is not yet approved or cleared by the [place]  FDA and has been authorized for detection and/or diagnosis of SARS-CoV-2 by FDA under an Emergency Use Authorization (EUA). This EUA will remain in effect (meaning this test can be used) for the duration of the COVID-19 declaration under Section 564(b)(1) of the Act, 21 U.S.C. section 360bbb-3(b)(1), unless the authorization is terminated or revoked.     Resp Syncytial Virus by PCR NEGATIVE NEGATIVE Final    Comment: (NOTE) Fact Sheet for Patients: bloggercourse.com  Fact Sheet for Healthcare Providers: seriousbroker.it  This test is not yet approved or cleared by the [place]  FDA and has been authorized for detection and/or diagnosis of SARS-CoV-2 by FDA under an Emergency Use Authorization (EUA). This EUA will remain in effect (meaning this test can be used) for the duration of the COVID-19 declaration under Section 564(b)(1) of the Act, 21 U.S.C. section 360bbb-3(b)(1), unless the authorization is terminated or revoked.  Performed at [facility], [address], [place], [place] [address]   Blood Culture (routine x 2)     Status: None   Collection Time: 12/03/24  9:29 PM   Specimen: BLOOD RIGHT HAND  Result Value Ref Range Status   Specimen Description BLOOD RIGHT HAND  Final   Special Requests   Final    BOTTLES DRAWN AEROBIC AND ANAEROBIC Blood Culture adequate volume   Culture   Final    NO GROWTH 5  DAYS Performed at [facility], [address]  [place], [place] [address]    Report Status 12/08/2024 FINAL  Final  Urine Culture     Status: Abnormal   Collection Time: 12/03/24 11:04 PM   Specimen: Urine, Catheterized  Result Value Ref Range Status   Specimen Description URINE, CATHETERIZED  Final   Special Requests NONE  Final   Culture (A)  Final    >=100,000 COLONIES/mL ENTEROCOCCUS FAECALIS >=100,000 COLONIES/mL LACTOBACILLUS SPECIES Standardized susceptibility testing for this organism is not available. Performed at [facility], [address] [place], [place] [address]    Report Status 12/06/2024 FINAL  Final   Organism ID, Bacteria ENTEROCOCCUS FAECALIS (A)  Final      Susceptibility   Enterococcus faecalis - MIC*    AMPICILLIN  <=2 SENSITIVE Sensitive     NITROFURANTOIN <=16 SENSITIVE Sensitive     VANCOMYCIN 1 SENSITIVE Sensitive     * >=100,000 COLONIES/mL ENTEROCOCCUS FAECALIS  Culture, blood (Routine X 2) w Reflex to ID Panel     Status: None   Collection Time: 12/05/24 11:17 AM   Specimen: BLOOD RIGHT HAND  Result Value Ref Range Status   Specimen Description BLOOD RIGHT HAND  Final   Special Requests   Final    BOTTLES DRAWN AEROBIC AND ANAEROBIC Blood Culture results may not be optimal due to an inadequate volume of blood received in culture bottles   Culture   Final    NO GROWTH 5 DAYS Performed at [facility], [address] [place], [place] [address]    Report Status 12/10/2024 FINAL  Final  Culture, blood (Routine X 2) w Reflex to ID Panel     Status: None   Collection Time: 12/05/24 11:17 AM   Specimen: BLOOD RIGHT ARM  Result Value Ref Range Status   Specimen Description BLOOD RIGHT ARM  Final   Special Requests   Final    BOTTLES DRAWN AEROBIC AND ANAEROBIC Blood Culture results may not be optimal due to an inadequate volume of blood received in culture bottles   Culture   Final    NO GROWTH 5 DAYS Performed at [facility], [address], [place], [place] [address]    Report Status 12/10/2024 FINAL  Final   Radiology Studies: No results found.  Scheduled Meds:  baclofen   30 mg Oral TID   Chlorhexidine  Gluconate Cloth  6 each Topical Daily   enoxaparin  (LOVENOX ) injection  40 mg Subcutaneous Q24H   linezolid   600 mg Oral Q12H   Continuous Infusions:   LOS: 6 days    Time spent: 35 mins   Darcel Dawley, MD [facility]   If 7PM-7AM, please contact night-coverage  "

## 2024-12-10 NOTE — TOC Progression Note (Addendum)
 Transition of Care [facility]) - Progression Note    Patient Details  Name: Mary Villanueva MRN: [id number] Date of Birth: 1956/08/06  [facility] CM/SW Contact  Lendia Dais, [place] Phone Number: 12/10/2024, 10:09 AM  Clinical Narrative:  Pt is medically stable for discharge.  CSW spoke to [facility] via phone who stated that no beds are available today for the pt to return. But will give an update later today about when they can take the pt back.  CSW will continue to follow.     Expected Discharge Plan: Skilled Nursing Facility Barriers to Discharge: Continued Medical Work up               Expected Discharge Plan and Services In-house Referral: Clinical Social Work, Chaplain     Living arrangements for the past 2 months: Skilled Nursing Facility                                       Social Drivers of Health (SDOH) Interventions SDOH Screenings   Food Insecurity: No Food Insecurity (10/30/2024)  Housing: Low Risk (10/30/2024)  Transportation Needs: Patient Unable To Answer (12/04/2024)  Utilities: Not At Risk (10/30/2024)  Social Connections: Unknown (12/04/2024)  Tobacco Use: Medium Risk (12/03/2024)    Readmission Risk Interventions     No data to display

## 2024-12-10 NOTE — Progress Notes (Signed)
 Physical Therapy Treatment Patient Details Name: Mary Villanueva MRN: [id number] DOB: [birth year] Today's Date: 12/10/2024   History of Present Illness Pt is a 69 y.o. female admitted 1/9 with acute encephalopathy. PMH:  MS for 40 years initially stable however progressively getting worse over last 10 years, chronic BLE weakness, spasticity, depression, anemia, anxiety, overactive bladder, CKD stage IIIa, hypothyroidism, HTN    PT Comments  Pt admitted with above diagnosis. Pt was able to sit EOB for 10 min with CGA to min assist with occasional mod assist when fatigued. Worked on stretching pts LEs of which pt could not tolerate much of. Was able to get pt in a good chair position in bed end of treatment which allowed some stretching to LEs.  Pt did report to this PT she has been lifted OOB for some time now. Unsure if she can progress with PT but will continue trial of PT.  Did message MD and nurse regarding pts nutrition/support for meals as pts meal had been sitting in room for a bit.  This PT assisted her with the meal and she stated she hadnt been eating much and had not been asked to order her food.   Pt currently with functional limitations due to the deficits listed below (see PT Problem List). Pt will benefit from acute skilled PT to increase their independence and safety with mobility to allow discharge.       If plan is discharge home, recommend the following: Two people to help with walking and/or transfers;Two people to help with bathing/dressing/bathroom   Can travel by private vehicle        Equipment Recommendations  Hospital bed;Hoyer lift    Recommendations for Other Services       Precautions / Restrictions Precautions Precautions: Fall Recall of Precautions/Restrictions: Intact Restrictions Weight Bearing Restrictions Per Provider Order: Yes Other Position/Activity Restrictions: R leg drawn up into flexion, contracted.  Very painful.     Mobility  Bed  Mobility Overal bed mobility: Needs Assistance Bed Mobility: Supine to Sit, Sit to Supine Rolling: Total assist   Supine to sit: Max assist Sit to supine: Total assist, HOB elevated, Used rails   General bed mobility comments: pain to LE's with movement, especially R knee    Transfers Overall transfer level: Needs assistance                 General transfer comment: NT, will need mechanical lift    Ambulation/Gait                   Stairs             Wheelchair Mobility     Tilt Bed    Modified Rankin (Stroke Patients Only)       Balance Overall balance assessment: Needs assistance Sitting-balance support: Single extremity supported, Bilateral upper extremity supported, Feet unsupported Sitting balance-Leahy Scale: Poor Sitting balance - Comments: CGA-minA for pt to sit for 10 min with occasional mod assist when fatigued.  When pt initially sat EOB, left LE extended and right LE flexion does relax a bit.  Noted pts left ankle is inverted and with incr dorsiflexion as well. Postural control: Posterior lean, Right lateral lean, Left lateral lean                                  Communication Communication Communication: Impaired Factors Affecting Communication: Difficulty expressing self (intermittently)  Cognition  Arousal: Alert Behavior During Therapy: WFL for tasks assessed/performed   PT - Cognitive impairments: Memory                       PT - Cognition Comments: pt providing conflicting reports regarding PLOF and for how long her RLE has been contracted for Following commands: Intact      Cueing Cueing Techniques: Verbal cues  Exercises Other Exercises Other Exercises: PT provides PROM to bilateral knees in an effort to increase knee extension ROM. PT is able to assist pt to ~ 10 degrees L knee extension, R knee is unable to progress into knee extension >80 degrees at this time. Pt was left in 45 degree chair  position with knees in above position . Pt reports being comfortable with LEs in this position as pt was on left side and in close to fetal position on entry to room.  Placed Prevalon boots on pt as well. Assisted pt with her lunch that was sitting in room on arrival.    General Comments        Pertinent Vitals/Pain Pain Assessment Pain Assessment: Faces Faces Pain Scale: Hurts whole lot Breathing: normal Pain Location: R knee Pain Descriptors / Indicators: Grimacing, Moaning Pain Intervention(s): Limited activity within patient's tolerance, Monitored during session, Repositioned    Home Living                          Prior Function            PT Goals (current goals can now be found in the care plan section) Progress towards PT goals: Progressing toward goals    Frequency    Min 2X/week      PT Plan      Co-evaluation              AM-PAC PT 6 Clicks Mobility   Outcome Measure  Help needed turning from your back to your side while in a flat bed without using bedrails?: A Lot Help needed moving from lying on your back to sitting on the side of a flat bed without using bedrails?: A Lot Help needed moving to and from a bed to a chair (including a wheelchair)?: Total Help needed standing up from a chair using your arms (e.g., wheelchair or bedside chair)?: Total Help needed to walk in hospital room?: Total Help needed climbing 3-5 steps with a railing? : Total 6 Click Score: 8    End of Session   Activity Tolerance: Patient tolerated treatment well Patient left: in bed;with call bell/phone within reach;with bed alarm set Nurse Communication: Mobility status;Need for lift equipment PT Visit Diagnosis: Other abnormalities of gait and mobility (R26.89);Muscle weakness (generalized) (M62.81);Pain     Time: 8568-8496 PT Time Calculation (min) (ACUTE ONLY): 32 min  Charges:    $Therapeutic Exercise: 8-22 mins $Therapeutic Activity: 8-22 mins PT  General Charges $$ ACUTE PT VISIT: 1 Visit                     Mary Villanueva M,PT Acute Rehab Services [phone]    Mary Villanueva 12/10/2024, 4:50 PM

## 2024-12-11 DIAGNOSIS — R410 Disorientation, unspecified: Secondary | ICD-10-CM | POA: Diagnosis not present

## 2024-12-11 DIAGNOSIS — N170 Acute kidney failure with tubular necrosis: Secondary | ICD-10-CM

## 2024-12-11 DIAGNOSIS — N390 Urinary tract infection, site not specified: Secondary | ICD-10-CM

## 2024-12-11 DIAGNOSIS — G35D Multiple sclerosis, unspecified: Secondary | ICD-10-CM | POA: Diagnosis not present

## 2024-12-11 DIAGNOSIS — N1831 Chronic kidney disease, stage 3a: Secondary | ICD-10-CM | POA: Diagnosis not present

## 2024-12-11 DIAGNOSIS — G9340 Encephalopathy, unspecified: Secondary | ICD-10-CM | POA: Diagnosis not present

## 2024-12-11 MED ORDER — VITAMIN B-12 100 MCG PO TABS
500.0000 ug | ORAL_TABLET | Freq: Every morning | ORAL | Status: DC
  Administered 2024-12-11 – 2024-12-14 (×4): 500 ug via ORAL
  Filled 2024-12-11 (×4): qty 5

## 2024-12-11 MED ORDER — OXYBUTYNIN CHLORIDE ER 10 MG PO TB24
10.0000 mg | ORAL_TABLET | Freq: Every day | ORAL | Status: DC
  Administered 2024-12-11 – 2024-12-13 (×3): 10 mg via ORAL
  Filled 2024-12-11 (×4): qty 1

## 2024-12-11 MED ORDER — ROSUVASTATIN CALCIUM 20 MG PO TABS
20.0000 mg | ORAL_TABLET | Freq: Every evening | ORAL | Status: DC
  Administered 2024-12-11 – 2024-12-13 (×3): 20 mg via ORAL
  Filled 2024-12-11 (×3): qty 1

## 2024-12-11 MED ORDER — FLUOXETINE HCL 20 MG PO CAPS
60.0000 mg | ORAL_CAPSULE | Freq: Every morning | ORAL | Status: DC
  Administered 2024-12-11 – 2024-12-14 (×4): 60 mg via ORAL
  Filled 2024-12-11 (×4): qty 3

## 2024-12-11 MED ORDER — VITAMIN D 25 MCG (1000 UNIT) PO TABS
1000.0000 [IU] | ORAL_TABLET | Freq: Two times a day (BID) | ORAL | Status: DC
  Administered 2024-12-11 – 2024-12-14 (×7): 1000 [IU] via ORAL
  Filled 2024-12-11 (×7): qty 1

## 2024-12-11 MED ORDER — LEVOTHYROXINE SODIUM 50 MCG PO TABS
50.0000 ug | ORAL_TABLET | Freq: Every day | ORAL | Status: DC
  Administered 2024-12-11 – 2024-12-14 (×4): 50 ug via ORAL
  Filled 2024-12-11 (×5): qty 1

## 2024-12-11 NOTE — Progress Notes (Addendum)
 "          Mary Villanueva, is a 70 y.o. female, DOB - 02-Oct-1956, FMW:[id number] Admit date - 12/03/2024    Outpatient Primary MD for the patient is Larnell Hamilton, MD  LOS - 7  days  Chief Complaint  Patient presents with   Altered Mental Status       Brief summary   Patient is a 69 year old female wheelchair-bound with a history of MS for 40 years, initially stable however progressively getting worse over the last 10 years, chronic bilateral weakness,  impaired mobility, spasticity, anxiety/depression, anemia, overactive bladder, CKD stage IIIa, hypothyroidism, hypertension presented from SNF for altered mental status.  Per EMS, patient had been screaming in pain and had not been responding to the staff as usual since the evening prior to admission.  Patient received pain medication and Ativan  without any improvement so EMS was called. On EMS arrival, patient was noted to have decreased respirations to around 6-7 and.  Of apnea.  2 mg of Narcan was administered with improvement.  Patient's sister reported that she had foul-smelling urine and seems to be in pain.  Otherwise at baseline patient is alert and oriented x 3.  :CT A/P with no acute intra-abdominal pathology. CT head with no acute intracranial abnormality. CTA chest PE study negative for PE. She was found to have UTI and started on empiric antibiotics. Patient admitted for further evaluation   Assessment & Plan    Acute metabolic encephalopathy -In the setting of chronic MS, worsened due to pain and UTI -CT head without any acute intracranial abnormalities.  Ammonia level, LFTs within normal limits, troponins flat. - currently alert and oriented, appears close to her baseline    UTI with acute cystitis: -History of prior UTIs, urine culture showed more than 100,000 colonies of Enterococcus, lactobacillus. -Started on empiric  treatment with IV Rocephin , susceptibilities issued ampicillin  and vancomycin. - Completed 5 days of linezolid .     AKI on CKD 3A: Creatinine slightly elevated to 1.5 on admission, now back closer to baseline of 1.0-1.2.  -Creatinine stable   Essential HTN: - BP stable  History of multiple sclerosis with impaired mobility, spasticity -Continue baclofen  - PT/OT evaluation, fall precautions. - Continue baclofen , tramadol  for pain  Hypothyroidism -Resume Synthroid  50 mcg daily, follow TSH  Overactive bladder -Resume oxybutynin  10 mg nightly  Hyperlipidemia -Resume Crestor  20 mg daily    Anxiety/depression -Resume fluoxetine    Estimated body mass index is 27.99 kg/m as calculated from the following:   Height as of 10/30/24: 5' (1.524 m).   Weight as of this encounter: 65 kg.  Code Status: Full code DVT Prophylaxis:  enoxaparin  (LOVENOX ) injection 40 mg Start: 12/10/24 1000   Level of Care: Level of care: Telemetry Family Communication: Updated patient Disposition Plan:      Remains inpatient appropriate: Awaiting SNF   Procedures:    Consultants:     Antimicrobials:   Anti-infectives (From admission, onward)    Start     Dose/Rate Route Frequency Ordered Stop  12/06/24 2200  linezolid  (ZYVOX ) tablet 600 mg        600 mg Oral Every 12 hours 12/06/24 1213 12/10/24 2112   12/06/24 0145  linezolid  (ZYVOX ) IVPB 600 mg  Status:  Discontinued        600 mg 300 mL/hr over 60 Minutes Intravenous Every 12 hours 12/06/24 0056 12/06/24 1213   12/05/24 2300  Ampicillin -Sulbactam (UNASYN ) 3 g in sodium chloride  0.9 % 100 mL IVPB  Status:  Discontinued        3 g 200 mL/hr over 30 Minutes Intravenous Every 8 hours 12/05/24 2208 12/06/24 0055   12/04/24 2200  cefTRIAXone  (ROCEPHIN ) 1 g in sodium chloride  0.9 % 100 mL IVPB  Status:  Discontinued        1 g 200 mL/hr over 30 Minutes Intravenous Every 24 hours 12/04/24 0148 12/05/24 2208   12/03/24 2230  cefTRIAXone   (ROCEPHIN ) 2 g in sodium chloride  0.9 % 100 mL IVPB        2 g 200 mL/hr over 30 Minutes Intravenous  Once 12/03/24 2217 12/03/24 2311          Medications  baclofen   30 mg Oral TID   Chlorhexidine  Gluconate Cloth  6 each Topical Daily   enoxaparin  (LOVENOX ) injection  40 mg Subcutaneous Q24H      Subjective:   Mary Villanueva was seen and examined today.  No acute complaints, patient denies dizziness, chest pain, shortness of breath, abdominal pain, N/V.  Afebrile, overnight no new events.  Objective:   Vitals:   12/10/24 1533 12/10/24 1948 12/11/24 0438 12/11/24 0854  BP: (!) 111/45 (!) 100/50 (!) 116/47 133/60  Pulse:  80 79 82  Resp:  16 16 19   Temp: 99 F (37.2 C) 98.6 F (37 C) 97.9 F (36.6 C) 98.4 F (36.9 C)  TempSrc: Oral Oral    SpO2: 98% 98% 96% 99%  Weight:        Intake/Output Summary (Last 24 hours) at 12/11/2024 1040 Last data filed at 12/11/2024 0900 Gross per 24 hour  Intake 200 ml  Output 1200 ml  Net -1000 ml     Wt Readings from Last 3 Encounters:  12/05/24 65 kg  10/29/24 65.8 kg  10/28/24 65.8 kg     Exam General: Alert and oriented x 3, NAD Cardiovascular: S1 S2 auscultated,  RRR Respiratory: Clear to auscultation bilaterally, no wheezing Gastrointestinal: Soft, nontender, nondistended, + bowel sounds Ext: no pedal edema bilaterally Neuro: No new deficits Psych: Normal affect     Data Reviewed:  I have personally reviewed following labs    CBC Lab Results  Component Value Date   WBC 10.4 12/10/2024   RBC 3.52 (L) 12/10/2024   HGB 11.1 (L) 12/10/2024   HCT 32.2 (L) 12/10/2024   MCV 91.5 12/10/2024   MCH 31.5 12/10/2024   PLT 323 12/10/2024   MCHC 34.5 12/10/2024   RDW 12.6 12/10/2024   LYMPHSABS 1.1 12/10/2024   MONOABS 0.8 12/10/2024   EOSABS 0.2 12/10/2024   BASOSABS 0.0 12/10/2024     Last metabolic panel Lab Results  Component Value Date   NA 136 12/10/2024   K 3.9 12/10/2024   CL 103 12/10/2024    CO2 25 12/10/2024   BUN 8 12/10/2024   CREATININE 0.86 12/10/2024   GLUCOSE 108 (H) 12/10/2024   GFRNONAA >60 12/10/2024   GFRAA 58 (L) 08/05/2020   CALCIUM  7.8 (L) 12/10/2024   PHOS 3.9 12/04/2024   PROT 6.8 12/03/2024  ALBUMIN 3.7 12/03/2024   LABGLOB 2.2 06/02/2018   AGRATIO 2.0 06/02/2018   BILITOT 0.4 12/03/2024   ALKPHOS 73 12/03/2024   AST 32 12/03/2024   ALT 24 12/03/2024   ANIONGAP 8 12/10/2024    CBG (last 3)  No results for input(s): GLUCAP in the last 72 hours.    Coagulation Profile: No results for input(s): INR, PROTIME in the last 168 hours.   Radiology Studies: I have personally reviewed the imaging studies  No results found.     Nydia Distance M.D. [facility] 12/11/2024, 10:40 AM  Available via Epic secure chat 7am-7pm After 7 pm, please refer to night coverage provider listed on amion.    "

## 2024-12-11 NOTE — Plan of Care (Signed)

## 2024-12-11 NOTE — TOC Progression Note (Signed)
 Transition of Care [facility]) - Progression Note    Patient Details  Name: Mary Villanueva MRN: [id number] Date of Birth: [birth year]  [facility] CM/SW Contact  Collier Bohnet [place], [place] Phone Number: 12/11/2024, 2:52 PM  Clinical Narrative:  Spoke to pt's sister [name] who is requesting SNF placement at [facility] if possible. Pt's sister reports they are agreeable with return to [facility] if needed. Will begin SNF search and f/u with offers as available.   Julien Das, MSW, LCSW [phone] (coverage)       Expected Discharge Plan: Skilled Nursing Facility Barriers to Discharge: Continued Medical Work up               Expected Discharge Plan and Services In-house Referral: Clinical Social Work, Chaplain     Living arrangements for the past 2 months: Skilled Nursing Facility                                       Social Drivers of Health (SDOH) Interventions SDOH Screenings   Food Insecurity: No Food Insecurity (10/30/2024)  Housing: Low Risk (10/30/2024)  Transportation Needs: Patient Unable To Answer (12/04/2024)  Utilities: Not At Risk (10/30/2024)  Social Connections: Unknown (12/04/2024)  Tobacco Use: Medium Risk (12/03/2024)    Readmission Risk Interventions     No data to display

## 2024-12-12 DIAGNOSIS — G9340 Encephalopathy, unspecified: Secondary | ICD-10-CM | POA: Diagnosis not present

## 2024-12-12 DIAGNOSIS — N390 Urinary tract infection, site not specified: Secondary | ICD-10-CM | POA: Diagnosis not present

## 2024-12-12 DIAGNOSIS — R410 Disorientation, unspecified: Secondary | ICD-10-CM | POA: Diagnosis not present

## 2024-12-12 DIAGNOSIS — N170 Acute kidney failure with tubular necrosis: Secondary | ICD-10-CM | POA: Diagnosis not present

## 2024-12-12 DIAGNOSIS — R531 Weakness: Secondary | ICD-10-CM | POA: Diagnosis not present

## 2024-12-12 DIAGNOSIS — N1831 Chronic kidney disease, stage 3a: Secondary | ICD-10-CM | POA: Diagnosis not present

## 2024-12-12 LAB — TSH: TSH: 4.5 u[IU]/mL (ref 0.350–4.500)

## 2024-12-12 MED ORDER — ENSURE PLUS HIGH PROTEIN PO LIQD
237.0000 mL | Freq: Two times a day (BID) | ORAL | Status: DC
  Administered 2024-12-12 – 2024-12-14 (×5): 237 mL via ORAL

## 2024-12-12 NOTE — Progress Notes (Signed)
 "          Mary Villanueva, is a 69 y.o. female, DOB - [birth year], FMW:[id number] Admit date - 12/03/2024    Outpatient Primary MD for the patient is Larnell Hamilton, MD  LOS - 8  days  Chief Complaint  Patient presents with   Altered Mental Status       Brief summary   Patient is a 69 year old female wheelchair-bound with a history of MS for 40 years, initially stable however progressively getting worse over the last 10 years, chronic bilateral weakness,  impaired mobility, spasticity, anxiety/depression, anemia, overactive bladder, CKD stage IIIa, hypothyroidism, hypertension presented from SNF for altered mental status.  Per EMS, patient had been screaming in pain and had not been responding to the staff as usual since the evening prior to admission.  Patient received pain medication and Ativan  without any improvement so EMS was called. On EMS arrival, patient was noted to have decreased respirations to around 6-7 and.  Of apnea.  2 mg of Narcan was administered with improvement.  Patient's sister reported that she had foul-smelling urine and seems to be in pain.  Otherwise at baseline patient is alert and oriented x 3.  :CT A/P with no acute intra-abdominal pathology. CT head with no acute intracranial abnormality. CTA chest PE study negative for PE. She was found to have UTI and started on empiric antibiotics. Patient admitted for further evaluation   Assessment & Plan    Acute metabolic encephalopathy -In the setting of chronic MS, worsened due to pain and UTI -CT head without any acute intracranial abnormalities.  Ammonia level, LFTs within normal limits, troponins flat. - Alert and oriented, appears close to her baseline.    UTI with acute cystitis: -History of prior UTIs, urine culture showed more than 100,000 colonies of Enterococcus, lactobacillus. -Started on empiric treatment  with IV Rocephin , susceptibilities to ampicillin  and vancomycin. - Completed 5 days of linezolid .     AKI on CKD 3A: Creatinine slightly elevated to 1.5 on admission, now back closer to baseline of 1.0-1.2.  -Creatinine stable   Essential HTN: - BP stable  History of multiple sclerosis with impaired mobility, spasticity - Fall precautions - PT recommended SNF.   - Continue baclofen , tramadol  for pain  Hypothyroidism -Resume Synthroid  50 mcg daily, follow TSH  Overactive bladder -Resume oxybutynin  10 mg nightly  Hyperlipidemia -Resume Crestor  20 mg daily    Anxiety/depression -Resume fluoxetine    Estimated body mass index is 27.99 kg/m as calculated from the following:   Height as of 10/30/24: 5' (1.524 m).   Weight as of this encounter: 65 kg.  Code Status: Full code DVT Prophylaxis:  enoxaparin  (LOVENOX ) injection 40 mg Start: 12/10/24 1000   Level of Care: Level of care: Telemetry Family Communication: Updated patient Disposition Plan:      Remains inpatient appropriate: Awaiting SNF   Procedures:    Consultants:     Antimicrobials:   Anti-infectives (From admission, onward)    Start     Dose/Rate Route Frequency Ordered Stop  12/06/24 2200  linezolid  (ZYVOX ) tablet 600 mg        600 mg Oral Every 12 hours 12/06/24 1213 12/10/24 2112   12/06/24 0145  linezolid  (ZYVOX ) IVPB 600 mg  Status:  Discontinued        600 mg 300 mL/hr over 60 Minutes Intravenous Every 12 hours 12/06/24 0056 12/06/24 1213   12/05/24 2300  Ampicillin -Sulbactam (UNASYN ) 3 g in sodium chloride  0.9 % 100 mL IVPB  Status:  Discontinued        3 g 200 mL/hr over 30 Minutes Intravenous Every 8 hours 12/05/24 2208 12/06/24 0055   12/04/24 2200  cefTRIAXone  (ROCEPHIN ) 1 g in sodium chloride  0.9 % 100 mL IVPB  Status:  Discontinued        1 g 200 mL/hr over 30 Minutes Intravenous Every 24 hours 12/04/24 0148 12/05/24 2208   12/03/24 2230  cefTRIAXone  (ROCEPHIN ) 2 g in sodium chloride   0.9 % 100 mL IVPB        2 g 200 mL/hr over 30 Minutes Intravenous  Once 12/03/24 2217 12/03/24 2311          Medications  baclofen   30 mg Oral TID   Chlorhexidine  Gluconate Cloth  6 each Topical Daily   cholecalciferol   1,000 Units Oral BID   enoxaparin  (LOVENOX ) injection  40 mg Subcutaneous Q24H   FLUoxetine   60 mg Oral q AM   levothyroxine   50 mcg Oral Q0600   oxybutynin   10 mg Oral QHS   rosuvastatin   20 mg Oral QPM   cyanocobalamin   500 mcg Oral q AM      Subjective:   Mary Villanueva was seen and examined today.  No acute complaints per the patient.  No chest pain, shortness of breath, fevers or chills.  Awaiting SNF.    Objective:   Vitals:   12/11/24 1748 12/11/24 2008 12/12/24 0501 12/12/24 0814  BP: 130/67 (!) 110/50 (!) 125/53 (!) 132/58  Pulse: 88 88 94 93  Resp: 19 18 18    Temp: 98.2 F (36.8 C) 99.6 F (37.6 C) 99.3 F (37.4 C) 98.9 F (37.2 C)  TempSrc:  Oral    SpO2: 94% 95% 95% 95%  Weight:        Intake/Output Summary (Last 24 hours) at 12/12/2024 1132 Last data filed at 12/12/2024 0800 Gross per 24 hour  Intake 800 ml  Output 1000 ml  Net -200 ml     Wt Readings from Last 3 Encounters:  12/05/24 65 kg  10/29/24 65.8 kg  10/28/24 65.8 kg    Physical Exam General: Alert and oriented x 3, NAD Cardiovascular: S1 S2 clear, RRR.  Respiratory: CTAB, no wheezing Gastrointestinal: Soft, nontender, nondistended, NBS Ext: no pedal edema bilaterally Neuro: no new deficits Psych: Normal affect    Data Reviewed:  I have personally reviewed following labs    CBC Lab Results  Component Value Date   WBC 10.4 12/10/2024   RBC 3.52 (L) 12/10/2024   HGB 11.1 (L) 12/10/2024   HCT 32.2 (L) 12/10/2024   MCV 91.5 12/10/2024   MCH 31.5 12/10/2024   PLT 323 12/10/2024   MCHC 34.5 12/10/2024   RDW 12.6 12/10/2024   LYMPHSABS 1.1 12/10/2024   MONOABS 0.8 12/10/2024   EOSABS 0.2 12/10/2024   BASOSABS 0.0 12/10/2024     Last metabolic  panel Lab Results  Component Value Date   NA 136 12/10/2024   K 3.9 12/10/2024   CL 103 12/10/2024   CO2 25 12/10/2024  BUN 8 12/10/2024   CREATININE 0.86 12/10/2024   GLUCOSE 108 (H) 12/10/2024   GFRNONAA >60 12/10/2024   GFRAA 58 (L) 08/05/2020   CALCIUM  7.8 (L) 12/10/2024   PHOS 3.9 12/04/2024   PROT 6.8 12/03/2024   ALBUMIN 3.7 12/03/2024   LABGLOB 2.2 06/02/2018   AGRATIO 2.0 06/02/2018   BILITOT 0.4 12/03/2024   ALKPHOS 73 12/03/2024   AST 32 12/03/2024   ALT 24 12/03/2024   ANIONGAP 8 12/10/2024    CBG (last 3)  No results for input(s): GLUCAP in the last 72 hours.    Coagulation Profile: No results for input(s): INR, PROTIME in the last 168 hours.   Radiology Studies: I have personally reviewed the imaging studies  No results found.     Nydia Distance M.D. [facility] 12/12/2024, 11:32 AM  Available via Epic secure chat 7am-7pm After 7 pm, please refer to night coverage provider listed on amion.    "

## 2024-12-12 NOTE — Plan of Care (Signed)

## 2024-12-13 DIAGNOSIS — N390 Urinary tract infection, site not specified: Secondary | ICD-10-CM | POA: Diagnosis not present

## 2024-12-13 DIAGNOSIS — R410 Disorientation, unspecified: Secondary | ICD-10-CM | POA: Diagnosis not present

## 2024-12-13 DIAGNOSIS — G35D Multiple sclerosis, unspecified: Secondary | ICD-10-CM | POA: Diagnosis not present

## 2024-12-13 DIAGNOSIS — R531 Weakness: Secondary | ICD-10-CM | POA: Diagnosis not present

## 2024-12-13 DIAGNOSIS — G9340 Encephalopathy, unspecified: Secondary | ICD-10-CM | POA: Diagnosis not present

## 2024-12-13 MED ORDER — TRAMADOL HCL 50 MG PO TABS: 50.0000 mg | ORAL_TABLET | Freq: Four times a day (QID) | ORAL | 0 refills | Status: AC | PRN

## 2024-12-13 NOTE — Plan of Care (Signed)
   Problem: Education: Goal: Knowledge of General Education information will improve Description: Including pain rating scale, medication(s)/side effects and non-pharmacologic comfort measures Outcome: Progressing   Problem: Clinical Measurements: Goal: Respiratory complications will improve Outcome: Progressing

## 2024-12-13 NOTE — Plan of Care (Signed)

## 2024-12-13 NOTE — Progress Notes (Signed)
 "          Mary Villanueva, is a 69 y.o. female, DOB - 1956/08/22, FMW:[id number] Admit date - 12/03/2024    Outpatient Primary MD for the patient is Larnell Hamilton, MD  LOS - 9  days  Chief Complaint  Patient presents with   Altered Mental Status       Brief summary   Patient is a 69 year old female wheelchair-bound with a history of MS for 40 years, initially stable however progressively getting worse over the last 10 years, chronic bilateral weakness,  impaired mobility, spasticity, anxiety/depression, anemia, overactive bladder, CKD stage IIIa, hypothyroidism, hypertension presented from SNF for altered mental status.  Per EMS, patient had been screaming in pain and had not been responding to the staff as usual since the evening prior to admission.  Patient received pain medication and Ativan  without any improvement so EMS was called. On EMS arrival, patient was noted to have decreased respirations to around 6-7 and.  Of apnea.  2 mg of Narcan was administered with improvement.  Patient's sister reported that she had foul-smelling urine and seems to be in pain.  Otherwise at baseline patient is alert and oriented x 3.  :CT A/P with no acute intra-abdominal pathology. CT head with no acute intracranial abnormality. CTA chest PE study negative for PE. She was found to have UTI and started on empiric antibiotics. Patient admitted for further evaluation   Awaiting SNF  Assessment & Plan    Acute metabolic encephalopathy -In the setting of chronic MS, worsened due to pain and UTI -CT head without any acute intracranial abnormalities.   - Ammonia level, LFTs within normal limits, troponins flat. - Appears close to her baseline, alert and oriented    UTI with acute cystitis: -History of prior UTIs, urine culture showed more than 100,000 colonies of Enterococcus, lactobacillus. -Started on  empiric treatment with IV Rocephin , susceptibilities to ampicillin  and vancomycin. - Completed 5 days of linezolid .     AKI on CKD 3A: Creatinine slightly elevated to 1.5 on admission, now back closer to baseline of 1.0-1.2.  -Creatinine stable   Essential HTN: - BP stable  History of multiple sclerosis with impaired mobility, spasticity - Fall precautions - PT recommended SNF.   - Continue baclofen , tramadol  for pain  Hypothyroidism - Continue synthroid  50 mcg daily, follow TSH  Overactive bladder - Continue oxybutynin  10 mg nightly  Hyperlipidemia - Continue Crestor  20 mg daily    Anxiety/depression - Continue fluoxetine    Estimated body mass index is 27.99 kg/m as calculated from the following:   Height as of this encounter: 5' (1.524 m).   Weight as of this encounter: 65 kg.  Code Status: Full code DVT Prophylaxis:  enoxaparin  (LOVENOX ) injection 40 mg Start: 12/10/24 1000   Level of Care: Level of care: Med-Surg Family Communication: Updated patient Disposition Plan:      Remains inpatient appropriate: Awaiting SNF   Procedures:    Consultants:     Antimicrobials:   Anti-infectives (From admission, onward)  Start     Dose/Rate Route Frequency Ordered Stop   12/06/24 2200  linezolid  (ZYVOX ) tablet 600 mg        600 mg Oral Every 12 hours 12/06/24 1213 12/10/24 2112   12/06/24 0145  linezolid  (ZYVOX ) IVPB 600 mg  Status:  Discontinued        600 mg 300 mL/hr over 60 Minutes Intravenous Every 12 hours 12/06/24 0056 12/06/24 1213   12/05/24 2300  Ampicillin -Sulbactam (UNASYN ) 3 g in sodium chloride  0.9 % 100 mL IVPB  Status:  Discontinued        3 g 200 mL/hr over 30 Minutes Intravenous Every 8 hours 12/05/24 2208 12/06/24 0055   12/04/24 2200  cefTRIAXone  (ROCEPHIN ) 1 g in sodium chloride  0.9 % 100 mL IVPB  Status:  Discontinued        1 g 200 mL/hr over 30 Minutes Intravenous Every 24 hours 12/04/24 0148 12/05/24 2208   12/03/24 2230  cefTRIAXone   (ROCEPHIN ) 2 g in sodium chloride  0.9 % 100 mL IVPB        2 g 200 mL/hr over 30 Minutes Intravenous  Once 12/03/24 2217 12/03/24 2311          Medications  baclofen   30 mg Oral TID   Chlorhexidine  Gluconate Cloth  6 each Topical Daily   cholecalciferol   1,000 Units Oral BID   enoxaparin  (LOVENOX ) injection  40 mg Subcutaneous Q24H   feeding supplement  237 mL Oral BID BM   FLUoxetine   60 mg Oral q AM   levothyroxine   50 mcg Oral Q0600   oxybutynin   10 mg Oral QHS   rosuvastatin   20 mg Oral QPM   cyanocobalamin   500 mcg Oral q AM      Subjective:   Mary Villanueva was seen and examined today.  No acute complaints.  Has some pain in the right knee otherwise stable.  Waiting for SNF  Objective:   Vitals:   12/12/24 1301 12/12/24 1706 12/12/24 1956 12/13/24 0824  BP: (!) 132/58 (!) 107/52 113/62 (!) 110/57  Pulse: 93 94 94 82  Resp: 18 19 18 18   Temp: 98.9 F (37.2 C) 98 F (36.7 C) 99.7 F (37.6 C) 98.4 F (36.9 C)  TempSrc: Oral     SpO2:  98% 96% 97%  Weight: 65 kg     Height: 5' (1.524 m)       Intake/Output Summary (Last 24 hours) at 12/13/2024 1245 Last data filed at 12/12/2024 2034 Gross per 24 hour  Intake 100 ml  Output 700 ml  Net -600 ml     Wt Readings from Last 3 Encounters:  12/12/24 65 kg  10/29/24 65.8 kg  10/28/24 65.8 kg   Physical Exam General: Alert and oriented x 3, NAD Cardiovascular: S1 S2 clear, RRR.  Respiratory: CTAB, no wheezing Gastrointestinal: Soft, nontender, nondistended, NBS Ext: no pedal edema bilaterally Psych: Normal affect    Data Reviewed:  I have personally reviewed following labs    CBC Lab Results  Component Value Date   WBC 10.4 12/10/2024   RBC 3.52 (L) 12/10/2024   HGB 11.1 (L) 12/10/2024   HCT 32.2 (L) 12/10/2024   MCV 91.5 12/10/2024   MCH 31.5 12/10/2024   PLT 323 12/10/2024   MCHC 34.5 12/10/2024   RDW 12.6 12/10/2024   LYMPHSABS 1.1 12/10/2024   MONOABS 0.8 12/10/2024   EOSABS 0.2  12/10/2024   BASOSABS 0.0 12/10/2024     Last metabolic panel Lab Results  Component  Value Date   NA 136 12/10/2024   K 3.9 12/10/2024   CL 103 12/10/2024   CO2 25 12/10/2024   BUN 8 12/10/2024   CREATININE 0.86 12/10/2024   GLUCOSE 108 (H) 12/10/2024   GFRNONAA >60 12/10/2024   GFRAA 58 (L) 08/05/2020   CALCIUM  7.8 (L) 12/10/2024   PHOS 3.9 12/04/2024   PROT 6.8 12/03/2024   ALBUMIN 3.7 12/03/2024   LABGLOB 2.2 06/02/2018   AGRATIO 2.0 06/02/2018   BILITOT 0.4 12/03/2024   ALKPHOS 73 12/03/2024   AST 32 12/03/2024   ALT 24 12/03/2024   ANIONGAP 8 12/10/2024    CBG (last 3)  No results for input(s): GLUCAP in the last 72 hours.    Coagulation Profile: No results for input(s): INR, PROTIME in the last 168 hours.   Radiology Studies: I have personally reviewed the imaging studies  No results found.     Nydia Distance M.D. [facility] 12/13/2024, 12:45 PM  Available via Epic secure chat 7am-7pm After 7 pm, please refer to night coverage provider listed on amion.    "

## 2024-12-13 NOTE — Plan of Care (Signed)
" °  Problem: Education: Goal: Knowledge of General Education information will improve Description: Including pain rating scale, medication(s)/side effects and non-pharmacologic comfort measures 12/13/2024 0431 by Jori Roderic CROME, RN Outcome: Progressing 12/13/2024 0430 by Jori Roderic CROME, RN Outcome: Progressing   Problem: Education: Goal: Knowledge of General Education information will improve Description: Including pain rating scale, medication(s)/side effects and non-pharmacologic comfort measures 12/13/2024 0431 by Jori Roderic CROME, RN Outcome: Progressing 12/13/2024 0430 by Jori Roderic CROME, RN Outcome: Progressing   Problem: Health Behavior/Discharge Planning: Goal: Ability to manage health-related needs will improve 12/13/2024 0431 by Jori Roderic CROME, RN Outcome: Progressing 12/13/2024 0430 by Jori Roderic CROME, RN Outcome: Progressing   Problem: Clinical Measurements: Goal: Ability to maintain clinical measurements within normal limits will improve 12/13/2024 0431 by Jori Roderic CROME, RN Outcome: Progressing 12/13/2024 0430 by Jori Roderic CROME, RN Outcome: Progressing Goal: Will remain free from infection 12/13/2024 0431 by Jori Roderic CROME, RN Outcome: Progressing 12/13/2024 0430 by Jori Roderic CROME, RN Outcome: Progressing Goal: Diagnostic test results will improve 12/13/2024 0431 by Jori Roderic CROME, RN Outcome: Progressing 12/13/2024 0430 by Jori Roderic CROME, RN Outcome: Progressing Goal: Respiratory complications will improve 12/13/2024 0431 by Jori Roderic CROME, RN Outcome: Progressing 12/13/2024 0430 by Jori Roderic CROME, RN Outcome: Progressing Goal: Cardiovascular complication will be avoided 12/13/2024 0431 by Jori Roderic CROME, RN Outcome: Progressing 12/13/2024 0430 by Jori Roderic CROME, RN Outcome: Progressing   Problem: Activity: Goal: Risk for activity intolerance will decrease 12/13/2024 0431 by  Jori Roderic CROME, RN Outcome: Progressing 12/13/2024 0430 by Jori Roderic CROME, RN Outcome: Progressing   Problem: Nutrition: Goal: Adequate nutrition will be maintained 12/13/2024 0431 by Jori Roderic CROME, RN Outcome: Progressing 12/13/2024 0430 by Jori Roderic CROME, RN Outcome: Progressing   Problem: Coping: Goal: Level of anxiety will decrease 12/13/2024 0431 by Jori Roderic CROME, RN Outcome: Progressing 12/13/2024 0430 by Jori Roderic CROME, RN Outcome: Progressing   Problem: Elimination: Goal: Will not experience complications related to bowel motility 12/13/2024 0431 by Jori Roderic CROME, RN Outcome: Progressing 12/13/2024 0430 by Jori Roderic CROME, RN Outcome: Progressing Goal: Will not experience complications related to urinary retention 12/13/2024 0431 by Jori Roderic CROME, RN Outcome: Progressing 12/13/2024 0430 by Jori Roderic CROME, RN Outcome: Progressing   Problem: Pain Managment: Goal: General experience of comfort will improve and/or be controlled 12/13/2024 0431 by Jori Roderic CROME, RN Outcome: Progressing 12/13/2024 0430 by Jori Roderic CROME, RN Outcome: Progressing   Problem: Safety: Goal: Ability to remain free from injury will improve 12/13/2024 0431 by Jori Roderic CROME, RN Outcome: Progressing 12/13/2024 0430 by Jori Roderic CROME, RN Outcome: Progressing   Problem: Skin Integrity: Goal: Risk for impaired skin integrity will decrease 12/13/2024 0431 by Jori Roderic CROME, RN Outcome: Progressing 12/13/2024 0430 by Jori Roderic CROME, RN Outcome: Progressing   "

## 2024-12-13 NOTE — TOC Progression Note (Signed)
 Transition of Care [facility]) - Progression Note    Patient Details  Name: Mary Villanueva MRN: [id number] Date of Birth: August 02, 1956  Transition of [facility]) CM/SW Contact  Lendia Dais, [place] Phone Number: 12/13/2024, 3:40 PM  Clinical Narrative:  Pt is medically stable for discharge. CSW spoke to Mer[facility] of [facility] and the SNF does not have any beds available today. Kia stated they are pending appeal discharges.  CSW will continue to follow and check in for bed availability.     Expected Discharge Plan: Skilled Nursing Facility Barriers to Discharge: Continued Medical Work up               Expected Discharge Plan and Services In-house Referral: Clinical Social Work, Chaplain     Living arrangements for the past 2 months: Skilled Nursing Facility                                       Social Drivers of Health (SDOH) Interventions SDOH Screenings   Food Insecurity: No Food Insecurity (10/30/2024)  Housing: Low Risk (10/30/2024)  Transportation Needs: Patient Unable To Answer (12/04/2024)  Utilities: Not At Risk (10/30/2024)  Social Connections: Unknown (12/04/2024)  Tobacco Use: Medium Risk (12/03/2024)    Readmission Risk Interventions     No data to display

## 2024-12-14 DIAGNOSIS — G35D Multiple sclerosis, unspecified: Secondary | ICD-10-CM | POA: Diagnosis not present

## 2024-12-14 DIAGNOSIS — N390 Urinary tract infection, site not specified: Secondary | ICD-10-CM | POA: Diagnosis not present

## 2024-12-14 DIAGNOSIS — R531 Weakness: Secondary | ICD-10-CM | POA: Diagnosis not present

## 2024-12-14 DIAGNOSIS — R410 Disorientation, unspecified: Secondary | ICD-10-CM | POA: Diagnosis not present

## 2024-12-14 DIAGNOSIS — G9340 Encephalopathy, unspecified: Secondary | ICD-10-CM | POA: Diagnosis not present

## 2024-12-14 NOTE — TOC Transition Note (Signed)
 Transition of Care [facility]) - Discharge Note   Patient Details  Name: Mary Villanueva MRN: [id number] Date of Birth: 1956-09-06  Transition of Care [facility]) CM/SW Contact:  Lendia Dais, LCSWA Phone Number: 12/14/2024, 2:39 PM   Clinical Narrative:  Pt discharging to [facility] today. RN report to [phone]. PTAR called at [phone].   CSW called and informed pt contact [place].  No further TOC needs.    Final next level of care: Long Term Nursing Home Barriers to Discharge: Barriers Resolved   Patient Goals and CMS Choice Patient states their goals for this hospitalization and ongoing recovery are:: Remain independent and to stand again   Choice offered to / list presented to : NA      Discharge Placement              Patient chooses bed at: [facility] Patient to be transferred to facility by: PTAR Name of family member notified: Holley Hopping Patient and family notified of of transfer: 12/14/24  Discharge Plan and Services Additional resources added to the After Visit Summary for   In-house Referral: Clinical Social Work, Orthoptist                                   Social Drivers of Health (SDOH) Interventions SDOH Screenings   Food Insecurity: No Food Insecurity (10/30/2024)  Housing: Low Risk (10/30/2024)  Transportation Needs: Patient Unable To Answer (12/04/2024)  Utilities: Not At Risk (10/30/2024)  Social Connections: Unknown (12/04/2024)  Tobacco Use: Medium Risk (12/03/2024)     Readmission Risk Interventions     No data to display

## 2024-12-14 NOTE — Progress Notes (Signed)
 Physical Therapy Treatment Patient Details Name: Mary Villanueva MRN: [id number] DOB: 03/10/1956 Today's Date: 12/14/2024   History of Present Illness Pt is a 69 y.o. female admitted 1/9 with acute encephalopathy. PMH:  MS for 40 years initially stable however progressively getting worse over last 10 years, chronic BLE weakness, spasticity, depression, anemia, anxiety, overactive bladder, CKD stage IIIa, hypothyroidism, HTN    PT Comments  Pt admitted with above diagnosis. Pt was able to sit EOB 15 min and perform ADLs with OT and PT co treating. Pt needed CGA to mod assist as she fatigued for sitting. Pt needs constant cues and assist to sit as she loses postural stability especially when dual tasking.  Worked on good positioning in bed prior to departure from room.  Pt currently with functional limitations due to the deficits listed below (see PT Problem List). Pt will benefit from acute skilled PT to increase their independence and safety with mobility to allow discharge.       If plan is discharge home, recommend the following: Two people to help with walking and/or transfers;Two people to help with bathing/dressing/bathroom   Can travel by private vehicle        Equipment Recommendations  Hospital bed;Hoyer lift    Recommendations for Other Services       Precautions / Restrictions Precautions Precautions: Fall Recall of Precautions/Restrictions: Intact Restrictions Weight Bearing Restrictions Per Provider Order: Yes Other Position/Activity Restrictions: R leg drawn up into flexion, contracted.  Very painful.     Mobility  Bed Mobility Overal bed mobility: Needs Assistance Bed Mobility: Supine to Sit, Sit to Supine     Supine to sit: Total assist, +2 for physical assistance Sit to supine: Total assist, HOB elevated, Used rails   General bed mobility comments: pain to LE's with movement, especially R knee    Transfers Overall transfer level: Needs assistance                  General transfer comment: NT, will need mechanical lift    Ambulation/Gait                   Stairs             Wheelchair Mobility     Tilt Bed    Modified Rankin (Stroke Patients Only)       Balance Overall balance assessment: Needs assistance Sitting-balance support: Bilateral upper extremity supported, Single extremity supported, No upper extremity supported, Feet supported Sitting balance-Leahy Scale: Poor Sitting balance - Comments: CGA-minA for pt to sit for 15 min with occasional mod assist when fatigued.  When pt initially sat EOB, left LE extended and right LE flexion does relax a bit.  Noted pts left ankle is inverted and with incr dorsiflexion as well. Pt able to sit and brush teeth, washed face and upperbody while sitting. Occasional right lateral lean when she fatigues needing cues vs support. Postural control: Posterior lean, Right lateral lean, Left lateral lean                                  Communication Communication Communication: Impaired Factors Affecting Communication: Difficulty expressing self (intermittently)  Cognition Arousal: Alert Behavior During Therapy: WFL for tasks assessed/performed   PT - Cognitive impairments: Memory                       PT - Cognition Comments: pt  providing conflicting reports regarding PLOF and for how long her RLE has been contracted for Following commands: Intact      Cueing Cueing Techniques: Verbal cues  Exercises Other Exercises Other Exercises: PT provides PROM to bilateral knees in an effort to increase knee extension ROM. PT is able to assist pt to ~ 10 degrees L knee extension, R knee is unable to progress into knee extension >100 degrees at this time. Pt was left in 45 degree chair position with knees in above position . Pt reports being comfortable with LEs in this position as pt was on left side and in close to fetal position on entry to room.  Placed  Prevalon boots on pt as well.    General Comments General comments (skin integrity, edema, etc.): VSS on RA      Pertinent Vitals/Pain Pain Assessment Pain Assessment: Faces Faces Pain Scale: Hurts whole lot Pain Location: R knee Pain Descriptors / Indicators: Grimacing, Moaning Pain Intervention(s): Limited activity within patient's tolerance, Monitored during session, Repositioned    Home Living                          Prior Function            PT Goals (current goals can now be found in the care plan section) Acute Rehab PT Goals Patient Stated Goal: unable to state Progress towards PT goals: Progressing toward goals    Frequency    Min 2X/week      PT Plan      Co-evaluation PT/OT/SLP Co-Evaluation/Treatment: Yes Reason for Co-Treatment: Complexity of the patient's impairments (multi-system involvement);For patient/therapist safety PT goals addressed during session: Mobility/safety with mobility;Strengthening/ROM        AM-PAC PT 6 Clicks Mobility   Outcome Measure  Help needed turning from your back to your side while in a flat bed without using bedrails?: A Lot Help needed moving from lying on your back to sitting on the side of a flat bed without using bedrails?: A Lot Help needed moving to and from a bed to a chair (including a wheelchair)?: Total Help needed standing up from a chair using your arms (e.g., wheelchair or bedside chair)?: Total Help needed to walk in hospital room?: Total Help needed climbing 3-5 steps with a railing? : Total 6 Click Score: 8    End of Session   Activity Tolerance: Patient tolerated treatment well;Patient limited by fatigue Patient left: in bed;with call bell/phone within reach;with bed alarm set Nurse Communication: Mobility status;Need for lift equipment PT Visit Diagnosis: Other abnormalities of gait and mobility (R26.89);Muscle weakness (generalized) (M62.81);Pain Pain - Right/Left: Right Pain -  part of body: Knee     Time: 8890-8855 PT Time Calculation (min) (ACUTE ONLY): 35 min  Charges:    $Therapeutic Activity: 8-22 mins PT General Charges $$ ACUTE PT VISIT: 1 Visit                     Mary Villanueva,PT Acute Rehab Services [phone]    Mary Villanueva Bevel 12/14/2024, 12:40 PM

## 2024-12-14 NOTE — Plan of Care (Signed)
" °  Problem: Education: Goal: Knowledge of General Education information will improve Description: Including pain rating scale, medication(s)/side effects and non-pharmacologic comfort measures 12/14/2024 0137 by Flynn Paralee PARAS, RN Outcome: Progressing 12/13/2024 2253 by Flynn Paralee PARAS, RN Outcome: Progressing   Problem: Clinical Measurements: Goal: Respiratory complications will improve 12/14/2024 0137 by Flynn Paralee PARAS, RN Outcome: Progressing 12/13/2024 2253 by Flynn Paralee PARAS, RN Outcome: Progressing   "

## 2024-12-14 NOTE — Progress Notes (Signed)
 Occupational Therapy Treatment Patient Details Name: Mary Villanueva MRN: [id number] DOB: 01-31-1956 Today's Date: 12/14/2024   History of present illness Pt is a 69 y.o. female admitted 1/9 with acute encephalopathy. PMH:  MS for 40 years initially stable however progressively getting worse over last 10 years, chronic BLE weakness, spasticity, depression, anemia, anxiety, overactive bladder, CKD stage IIIa, hypothyroidism, HTN   OT comments  Pt progressing towards goals. Focus of session on progressing functional mobility in conjunction with PT, as well as increase independent engagement in ADL tasks while sitting EOB for 15 minutes. Pt required up to Total A +2 for functional mobility and Min A for UB ADL tasks. Pt continues to benefit from acute OT services, continue per POC.       If plan is discharge home, recommend the following:  A lot of help with bathing/dressing/bathroom;Two people to help with walking and/or transfers;Assist for transportation   Equipment Recommendations  None recommended by OT    Recommendations for Other Services      Precautions / Restrictions Precautions Precautions: Fall Recall of Precautions/Restrictions: Intact Restrictions Weight Bearing Restrictions Per Provider Order: Yes Other Position/Activity Restrictions: R leg drawn up into flexion, contracted.  Very painful.       Mobility Bed Mobility Overal bed mobility: Needs Assistance Bed Mobility: Supine to Sit, Sit to Supine     Supine to sit: Total assist, +2 for physical assistance Sit to supine: Total assist, HOB elevated, Used rails   General bed mobility comments: pain to LE's with movement, especially R knee    Transfers Overall transfer level: Needs assistance                 General transfer comment: NT, will need mechanical lift     Balance Overall balance assessment: Needs assistance Sitting-balance support: Bilateral upper extremity supported, Single extremity  supported, No upper extremity supported, Feet supported Sitting balance-Leahy Scale: Poor Sitting balance - Comments: CGA-minA for pt to sit for 15 min with occasional mod assist when fatigued.  When pt initially sat EOB, left LE extended and right LE flexion does relax a bit.  Noted pts left ankle is inverted and with incr dorsiflexion as well. Pt able to sit and brush teeth, washed face and upperbody while sitting. Occasional right lateral lean when she fatigues needing cues vs support. Pt engaged in cross midline reaching task with RUE to place ADL object on table on L side. Postural control: Posterior lean, Right lateral lean                                 ADL either performed or assessed with clinical judgement   ADL Overall ADL's : Needs assistance/impaired Eating/Feeding: Set up;Sitting   Grooming: Wash/dry face;Oral care;Contact guard assist;Sitting Grooming Details (indicate cue type and reason): CGA sitting EOB for body in midline cues Upper Body Bathing: Minimal assistance;Sitting       Upper Body Dressing : Minimal assistance;Sitting                          Extremity/Trunk Assessment Upper Extremity Assessment Upper Extremity Assessment: Generalized weakness            Vision       Perception     Praxis     Communication Communication Communication: Impaired Factors Affecting Communication: Difficulty expressing self (intermittently)   Cognition Arousal: Alert Behavior During Therapy: [facility]  for tasks assessed/performed Cognition: No apparent impairments                               Following commands: Intact        Cueing   Cueing Techniques: Verbal cues, Tactile cues  Exercises      Shoulder Instructions       General Comments VSS on RA. Pt repositioned in bed to promote anti-contracture positioning of BLE    Pertinent Vitals/ Pain       Pain Assessment Pain Assessment: Faces Faces Pain Scale: Hurts  whole lot Pain Location: R knee Pain Descriptors / Indicators: Grimacing, Moaning Pain Intervention(s): Limited activity within patient's tolerance, Monitored during session, Repositioned  Home Living                                          Prior Functioning/Environment              Frequency  Min 2X/week        Progress Toward Goals  OT Goals(current goals can now be found in the care plan section)  Progress towards OT goals: Progressing toward goals  Acute Rehab OT Goals Patient Stated Goal: to return home OT Goal Formulation: With patient Time For Goal Achievement: 12/20/24 Potential to Achieve Goals: Fair ADL Goals Pt Will Perform Grooming: with mod assist;with min assist;sitting Pt Will Perform Upper Body Bathing: with mod assist;with min assist Pt Will Perform Upper Body Dressing: with mod assist;with min assist;sitting Additional ADL Goal #1: Pt will complete bed mobility max-mod A to sit EOB in prep for grooming and UB ADL tasks  Plan      Co-evaluation    PT/OT/SLP Co-Evaluation/Treatment: Yes Reason for Co-Treatment: Complexity of the patient's impairments (multi-system involvement);For patient/therapist safety PT goals addressed during session: Mobility/safety with mobility;Strengthening/ROM OT goals addressed during session: ADL's and self-care;Strengthening/ROM      AM-PAC OT 6 Clicks Daily Activity     Outcome Measure   Help from another person eating meals?: A Little Help from another person taking care of personal grooming?: A Little Help from another person toileting, which includes using toliet, bedpan, or urinal?: Total Help from another person bathing (including washing, rinsing, drying)?: A Lot Help from another person to put on and taking off regular upper body clothing?: A Little Help from another person to put on and taking off regular lower body clothing?: Total 6 Click Score: 13    End of Session    OT Visit  Diagnosis: Muscle weakness (generalized) (M62.81);Pain Pain - Right/Left: Left Pain - part of body: Knee;Leg   Activity Tolerance Patient tolerated treatment well   Patient Left in bed;with call bell/phone within reach;with bed alarm set   Nurse Communication          Time: 8890-8855 OT Time Calculation (min): 35 min  Charges: OT General Charges $OT Visit: 1 Visit OT Treatments $Self Care/Home Management : 8-22 mins  Maurilio CROME, OTR/L.  [facility] Acute Rehabilitation  Office: [phone]   Maurilio PARAS Taariq Leitz 12/14/2024, 12:57 PM

## 2024-12-14 NOTE — Discharge Summary (Signed)
 " Physician Discharge Summary   Patient: Mary Villanueva MRN: [id number] DOB: 01-18-1956  Admit date:     12/03/2024  Discharge date: 12/14/24  Discharge Physician: Nesiah Jump. MD    PCP: Larnell Hamilton, MD   Recommendations at discharge:   Fall precautions, delirium precautions  Discharge Diagnoses:    Acute metabolic encephalopathy-improved   UTI with acute cystitis  Acute kidney injury on CKD stage 3a  Essential hypertension   Generalized weakness   History of multiple sclerosis  Overactive bladder  Hyperlipidemia  Anxiety/depression  Hospital Course:  Patient is a 69 year old female wheelchair-bound with a history of MS for 40 years, initially stable however progressively getting worse over the last 10 years, chronic bilateral weakness,  impaired mobility, spasticity, anxiety/depression, anemia, overactive bladder, CKD stage IIIa, hypothyroidism, hypertension presented from SNF for altered mental status.  Per EMS, patient had been screaming in pain and had not been responding to the staff as usual since the evening prior to admission.  Patient received pain medication and Ativan  without any improvement so EMS was called. On EMS arrival, patient was noted to have decreased respirations to around 6-7 and.  Of apnea.  2 mg of Narcan was administered with improvement.  Patient's sister reported that she had foul-smelling urine and seems to be in pain.  Otherwise at baseline patient is alert and oriented x 3.   :CT A/P with no acute intra-abdominal pathology. CT head with no acute intracranial abnormality. CTA chest PE study negative for PE. She was found to have UTI and started on empiric antibiotics. Patient admitted for further evaluation   Assessment and Plan:  Acute metabolic encephalopathy -In the setting of chronic MS, worsened due to pain and UTI -CT head without any acute intracranial abnormalities.   - Ammonia level, LFTs within normal limits, troponins flat. - Alert  and oriented, appears close to her baseline.     UTI with acute cystitis: -History of prior UTIs, urine culture showed more than 100,000 colonies of Enterococcus, lactobacillus. -Started on empiric treatment with IV Rocephin , susceptibilities to ampicillin  and vancomycin. - Completed 5 days of linezolid , no need for further antibiotics.       AKI on CKD 3A: Creatinine slightly elevated to 1.5 on admission, now back closer to baseline of 1.0-1.2.  -Creatinine stable   Essential HTN: - BP stable   History of multiple sclerosis with impaired mobility, spasticity - Fall precautions - PT recommended SNF.   - Continue baclofen , tramadol  PRN for pain   Hypothyroidism - Continue synthroid  50 mcg daily, TSH 4.5   Overactive bladder - Continue oxybutynin  10 mg nightly   Hyperlipidemia - Continue Crestor  20 mg daily    Anxiety/depression - Continue fluoxetine      Estimated body mass index is 27.99 kg/m as calculated from the following:   Height as of this encounter: 5' (1.524 m).   Weight as of this encounter: 65 kg.       Pain control - [place]  Controlled Substance Reporting System database was reviewed. and patient was instructed, not to drive, operate heavy machinery, perform activities at heights, swimming or participation in water activities or provide baby-sitting services while on Pain, Sleep and Anxiety Medications; until their outpatient Physician has advised to do so again. Also recommended to not to take more than prescribed Pain, Sleep and Anxiety Medications.  Consultants: None Procedures performed: None Disposition: Skilled nursing facility Diet recommendation:   DISCHARGE MEDICATION: Allergies as of 12/14/2024  Reactions   Unasyn  [ampicillin -sulbactam Sodium ] Rash        Medication List     TAKE these medications    acetaminophen  325 MG tablet Commonly known as: TYLENOL  Take 650 mg by mouth every 6 (six) hours as needed for mild pain  (pain score 1-3) or moderate pain (pain score 4-6).   alendronate 70 MG tablet Commonly known as: FOSAMAX Take 70 mg by mouth every Monday.   baclofen  20 MG tablet Commonly known as: LIORESAL  Take 30 mg by mouth 3 (three) times daily. Give one and one-half tablet (30mg ) by mouth three times a day.   Biotin  10 MG Caps Take 10 mg by mouth every morning.   CENTRUM SILVER 50+WOMEN PO Take 1 capsule by mouth daily.   Cholecalciferol  25 MCG (1000 UT) tablet Take 1,000 Units by mouth 2 (two) times daily.   cyanocobalamin  500 MCG tablet Commonly known as: VITAMIN B12 Take 500 mcg by mouth in the morning.   Fish Oil 1200 MG Caps Take 1,200 mg by mouth daily.   FLUoxetine  20 MG capsule Commonly known as: PROZAC  Take 60 mg by mouth in the morning.   hydrALAZINE  25 MG tablet Commonly known as: APRESOLINE  Take 25 mg by mouth daily as needed (High Blood pressure).   levothyroxine  50 MCG tablet Commonly known as: SYNTHROID  Take 50 mcg by mouth every morning.   oxybutynin  10 MG 24 hr tablet Commonly known as: DITROPAN -XL Take 10 mg by mouth at bedtime.   PROBIOTIC DAILY PO Take 1 capsule by mouth daily.   rosuvastatin  20 MG tablet Commonly known as: CRESTOR  Take 20 mg by mouth every evening.   senna-docusate 8.6-50 MG tablet Commonly known as: Senokot-S Take 1 tablet by mouth at bedtime.   traMADol  50 MG tablet Commonly known as: ULTRAM  Take 1 tablet (50 mg total) by mouth every 6 (six) hours as needed for up to 14 days for moderate pain (pain score 4-6) or severe pain (pain score 7-10).   trimethoprim 100 MG tablet Commonly known as: TRIMPEX Take 100 mg by mouth daily.        Discharge Exam: Filed Weights   12/05/24 2100 12/12/24 1301  Weight: 65 kg 65 kg   S: No acute complaints except chronic right knee pain.  Alert and oriented, close to her baseline mental status.  Cleared for discharge to SNF  BP (!) 110/55 (BP Location: Left Arm)   Pulse 80   Temp 98.6  F (37 C) (Oral)   Resp 18   Ht 5' (1.524 m)   Wt 65 kg   SpO2 100%   BMI 27.99 kg/m   Physical Exam General: Alert and oriented, NAD Cardiovascular: S1 S2 clear, RRR.  Respiratory: CTAB, no wheezing Gastrointestinal: Soft, nontender, nondistended, NBS Ext: no pedal edema bilaterally Psych: Normal affect    Condition at discharge: fair  The results of significant diagnostics from this hospitalization (including imaging, microbiology, ancillary and laboratory) are listed below for reference.   Imaging Studies: CT ABDOMEN PELVIS W CONTRAST Result Date: 12/03/2024 EXAM: CT ABDOMEN AND PELVIS WITH CONTRAST 12/03/2024 09:17:00 PM TECHNIQUE: CT of the abdomen and pelvis was performed with the administration of 65 mL of iohexol  (OMNIPAQUE ) 350 MG/ML injection. Multiplanar reformatted images are provided for review. Automated exposure control, iterative reconstruction, and/or weight-based adjustment of the mA/kV was utilized to reduce the radiation dose to as low as reasonably achievable. COMPARISON: None available. CLINICAL HISTORY: Abdominal pain, acute, nonlocalized. FINDINGS: LOWER CHEST: No acute abnormality.  LIVER: The liver is unremarkable. GALLBLADDER AND BILE DUCTS: Gallbladder is unremarkable. No biliary ductal dilatation. SPLEEN: No acute abnormality. PANCREAS: No acute abnormality. ADRENAL GLANDS: No acute abnormality. KIDNEYS, URETERS AND BLADDER: Subcentimeter hypodensity within the right kidney, too small to characterize - no further follow-up indicated. No stones in the kidneys or ureters. No hydronephrosis. No perinephric or periureteral stranding. Lobulated urinary contour with intramural calcified fibroids. Urinary bladder lumen decompressed with foley catheter tip and balloon terminating within its lumen. GI AND BOWEL: Tiny hiatal hernia. Stomach demonstrates no acute abnormality. No small or large bowel thickening or dilatation. Colonic diverticulosis. The appendix is  unremarkable. There is no bowel obstruction. PERITONEUM AND RETROPERITONEUM: No ascites. No free air. VASCULATURE: Moderate atherosclerotic plaque of the aorta. LYMPH NODES: No lymphadenopathy. REPRODUCTIVE ORGANS: No adnexal mass. BONES AND SOFT TISSUES: No acute osseous abnormality. No focal soft tissue abnormality. IMPRESSION: 1. No acute abnormality in the abdomen or pelvis. 2. Other, non-acute and/or normal findings as above. Electronically signed by: Morgane Naveau MD MD 12/03/2024 09:39 PM EST RP Workstation: [id number]   CT Angio Chest PE W and/or Wo Contrast Result Date: 12/03/2024 EXAM: CTA of the Chest without and with contrast for PE 12/03/2024 09:17:00 PM TECHNIQUE: CTA of the chest was performed after the administration of 65 mL of intravenous iohexol  (OMNIPAQUE ) 350 MG/ML injection. Multiplanar reformatted images are provided for review. MIP images are provided for review. Automated exposure control, iterative reconstruction, and/or weight based adjustment of the mA/kV was utilized to reduce the radiation dose to as low as reasonably achievable. COMPARISON: None available. CLINICAL HISTORY: Pulmonary embolism (PE) suspected, high prob. FINDINGS: PULMONARY ARTERIES: Pulmonary arteries are adequately opacified for evaluation. No pulmonary embolus. Limited evaluation of the subsegmental level due to motion artifact. Main pulmonary artery is normal in caliber. MEDIASTINUM: The heart and pericardium demonstrate no acute abnormality. Left Anterior Descending Coronary Artery calcification. Mild atherosclerotic plaque. The thoracic aorta is normal in caliber. LYMPH NODES: No mediastinal, hilar or axillary lymphadenopathy. LUNGS AND PLEURA: Elevated right hemidiaphragm with right middle lobe atelectasis. No focal consolidation or pulmonary edema. No pleural effusion or pneumothorax. UPPER ABDOMEN: Limited images of the upper abdomen demonstrate a tiny hiatal hernia. Otherwise, limited images of the upper  abdomen are unremarkable. SOFT TISSUES AND BONES: Limited evaluation for acute rib fracture due to motion artifact. No acute soft tissue abnormality. IMPRESSION: 1. No pulmonary embolism, with limited evaluation at the subsegmental level due to motion artifact. 2. No acute findings. 3. Other, non-acute and/or normal findings as above. Electronically signed by: Morgane Naveau MD MD 12/03/2024 09:32 PM EST RP Workstation: [id number]   CT Head Wo Contrast Result Date: 12/03/2024 EXAM: CT HEAD WITHOUT CONTRAST 12/03/2024 09:17:00 PM TECHNIQUE: CT of the head was performed without the administration of intravenous contrast. Automated exposure control, iterative reconstruction, and/or weight based adjustment of the mA/kV was utilized to reduce the radiation dose to as low as reasonably achievable. COMPARISON: CT head 08/01/2020. CLINICAL HISTORY: Mental status change, unknown cause. FINDINGS: BRAIN AND VENTRICLES: No acute hemorrhage. No evidence of acute infarct. No hydrocephalus. No extra-axial collection. No mass effect or midline shift. Cerebral ventricle sizes are concordant with the degree of cerebral volume loss. Periventricular white matter changes, likely sequela of chronic small vessel ischemic disease. ORBITS: No acute abnormality. SINUSES: No acute abnormality. SOFT TISSUES AND SKULL: No acute soft tissue abnormality. No skull fracture. IMPRESSION: 1. No acute intracranial abnormality. Electronically signed by: Morgane Naveau MD MD 12/03/2024 09:27 PM EST RP Workstation:  [id number]   DG Chest Portable 1 View Result Date: 12/03/2024 EXAM: 1 VIEW(S) XRAY OF THE CHEST 12/03/2024 09:02:00 PM COMPARISON: Chest x-ray 10/28/2024, CT angio chest 12/03/2024. CLINICAL HISTORY: Questionable sepsis - evaluate for abnormality. FINDINGS: LUNGS AND PLEURA: Elevated right hemidiaphragm. Right lung base atelectasis. No pleural effusion. No pneumothorax. HEART AND MEDIASTINUM: Aortic atherosclerosis. BONES AND SOFT TISSUES:  Bowel beneath right hemidiaphragm. No acute osseous abnormality. IMPRESSION: 1. No acute findings. Electronically signed by: Morgane Naveau MD MD 12/03/2024 09:20 PM EST RP Workstation: [id number]    Microbiology: Results for orders placed or performed during the hospital encounter of 12/03/24  Blood Culture (routine x 2)     Status: Abnormal   Collection Time: 12/03/24  8:16 PM   Specimen: BLOOD  Result Value Ref Range Status   Specimen Description BLOOD RIGHT ANTECUBITAL  Final   Special Requests   Final    BOTTLES DRAWN AEROBIC AND ANAEROBIC Blood Culture adequate volume   Culture  Setup Time   Final    GRAM POSITIVE COCCI AEROBIC BOTTLE ONLY CRITICAL RESULT CALLED TO, READ BACK BY AND VERIFIED WITH: PHARMD G ABBOTT 12/05/2024 @ 0400 BY AB    Culture (A)  Final    STAPHYLOCOCCUS HOMINIS THE SIGNIFICANCE OF ISOLATING THIS ORGANISM FROM A SINGLE SET OF BLOOD CULTURES WHEN MULTIPLE SETS ARE DRAWN IS UNCERTAIN. PLEASE NOTIFY THE MICROBIOLOGY DEPARTMENT WITHIN ONE WEEK IF SPECIATION AND SENSITIVITIES ARE REQUIRED. Performed at [facility], [address], [place], [place] [address]    Report Status 12/06/2024 FINAL  Final  Blood Culture ID Panel (Reflexed)     Status: Abnormal   Collection Time: 12/03/24  8:16 PM  Result Value Ref Range Status   Enterococcus faecalis NOT DETECTED NOT DETECTED Final   Enterococcus Faecium NOT DETECTED NOT DETECTED Final   Listeria monocytogenes NOT DETECTED NOT DETECTED Final   Staphylococcus species DETECTED (A) NOT DETECTED Final    Comment: CRITICAL RESULT CALLED TO, READ BACK BY AND VERIFIED WITH: PHARMD G ABBOTT 12/05/2024 @ 0400 BY AB    Staphylococcus aureus (BCID) NOT DETECTED NOT DETECTED Final   Staphylococcus epidermidis NOT DETECTED NOT DETECTED Final   Staphylococcus lugdunensis NOT DETECTED NOT DETECTED Final   Streptococcus species NOT DETECTED NOT DETECTED Final   Streptococcus agalactiae NOT DETECTED NOT DETECTED Final    Streptococcus pneumoniae NOT DETECTED NOT DETECTED Final   Streptococcus pyogenes NOT DETECTED NOT DETECTED Final   A.calcoaceticus-baumannii NOT DETECTED NOT DETECTED Final   Bacteroides fragilis NOT DETECTED NOT DETECTED Final   Enterobacterales NOT DETECTED NOT DETECTED Final   Enterobacter cloacae complex NOT DETECTED NOT DETECTED Final   Escherichia coli NOT DETECTED NOT DETECTED Final   Klebsiella aerogenes NOT DETECTED NOT DETECTED Final   Klebsiella oxytoca NOT DETECTED NOT DETECTED Final   Klebsiella pneumoniae NOT DETECTED NOT DETECTED Final   Proteus species NOT DETECTED NOT DETECTED Final   Salmonella species NOT DETECTED NOT DETECTED Final   Serratia marcescens NOT DETECTED NOT DETECTED Final   Haemophilus influenzae NOT DETECTED NOT DETECTED Final   Neisseria meningitidis NOT DETECTED NOT DETECTED Final   Pseudomonas aeruginosa NOT DETECTED NOT DETECTED Final   Stenotrophomonas maltophilia NOT DETECTED NOT DETECTED Final   Candida albicans NOT DETECTED NOT DETECTED Final   Candida auris NOT DETECTED NOT DETECTED Final   Candida glabrata NOT DETECTED NOT DETECTED Final   Candida krusei NOT DETECTED NOT DETECTED Final   Candida parapsilosis NOT DETECTED NOT DETECTED Final   Candida  tropicalis NOT DETECTED NOT DETECTED Final   Cryptococcus neoformans/gattii NOT DETECTED NOT DETECTED Final    Comment: Performed at [facility], [address] [place], [place] [address]  Resp panel by RT-PCR (RSV, Flu A&B, Covid)     Status: None   Collection Time: 12/03/24  8:17 PM   Specimen: Nasal Swab  Result Value Ref Range Status   SARS Coronavirus 2 by RT PCR NEGATIVE NEGATIVE Final   Influenza A by PCR NEGATIVE NEGATIVE Final   Influenza B by PCR NEGATIVE NEGATIVE Final    Comment: (NOTE) The Xpert Xpress SARS-CoV-2/FLU/RSV plus assay is intended as an aid in the diagnosis of influenza from Nasopharyngeal swab specimens and should not be used as a sole basis for treatment.  Nasal washings and aspirates are unacceptable for Xpert Xpress SARS-CoV-2/FLU/RSV testing.  Fact Sheet for Patients: bloggercourse.com  Fact Sheet for Healthcare Providers: seriousbroker.it  This test is not yet approved or cleared by the [place]  FDA and has been authorized for detection and/or diagnosis of SARS-CoV-2 by FDA under an Emergency Use Authorization (EUA). This EUA will remain in effect (meaning this test can be used) for the duration of the COVID-19 declaration under Section 564(b)(1) of the Act, 21 U.S.C. section 360bbb-3(b)(1), unless the authorization is terminated or revoked.     Resp Syncytial Virus by PCR NEGATIVE NEGATIVE Final    Comment: (NOTE) Fact Sheet for Patients: bloggercourse.com  Fact Sheet for Healthcare Providers: seriousbroker.it  This test is not yet approved or cleared by the [place]  FDA and has been authorized for detection and/or diagnosis of SARS-CoV-2 by FDA under an Emergency Use Authorization (EUA). This EUA will remain in effect (meaning this test can be used) for the duration of the COVID-19 declaration under Section 564(b)(1) of the Act, 21 U.S.C. section 360bbb-3(b)(1), unless the authorization is terminated or revoked.  Performed at [facility], [address] [place], [place] [address]   Blood Culture (routine x 2)     Status: None   Collection Time: 12/03/24  9:29 PM   Specimen: BLOOD RIGHT HAND  Result Value Ref Range Status   Specimen Description BLOOD RIGHT HAND  Final   Special Requests   Final    BOTTLES DRAWN AEROBIC AND ANAEROBIC Blood Culture adequate volume   Culture   Final    NO GROWTH 5 DAYS Performed at [facility], [address], [place], [place] [address]    Report Status 12/08/2024 FINAL  Final  Urine Culture     Status: Abnormal   Collection Time: 12/03/24 11:04 PM   Specimen:  Urine, Catheterized  Result Value Ref Range Status   Specimen Description URINE, CATHETERIZED  Final   Special Requests NONE  Final   Culture (A)  Final    >=100,000 COLONIES/mL ENTEROCOCCUS FAECALIS >=100,000 COLONIES/mL LACTOBACILLUS SPECIES Standardized susceptibility testing for this organism is not available. Performed at [facility], [address] [place], [place] [address]    Report Status 12/06/2024 FINAL  Final   Organism ID, Bacteria ENTEROCOCCUS FAECALIS (A)  Final      Susceptibility   Enterococcus faecalis - MIC*    AMPICILLIN  <=2 SENSITIVE Sensitive     NITROFURANTOIN <=16 SENSITIVE Sensitive     VANCOMYCIN 1 SENSITIVE Sensitive     * >=100,000 COLONIES/mL ENTEROCOCCUS FAECALIS  Culture, blood (Routine X 2) w Reflex to ID Panel     Status: None   Collection Time: 12/05/24 11:17 AM  Specimen: BLOOD RIGHT HAND  Result Value Ref Range Status   Specimen Description BLOOD RIGHT HAND  Final   Special Requests   Final    BOTTLES DRAWN AEROBIC AND ANAEROBIC Blood Culture results may not be optimal due to an inadequate volume of blood received in culture bottles   Culture   Final    NO GROWTH 5 DAYS Performed at [facility], [address] [place], [place] [address]    Report Status 12/10/2024 FINAL  Final  Culture, blood (Routine X 2) w Reflex to ID Panel     Status: None   Collection Time: 12/05/24 11:17 AM   Specimen: BLOOD RIGHT ARM  Result Value Ref Range Status   Specimen Description BLOOD RIGHT ARM  Final   Special Requests   Final    BOTTLES DRAWN AEROBIC AND ANAEROBIC Blood Culture results may not be optimal due to an inadequate volume of blood received in culture bottles   Culture   Final    NO GROWTH 5 DAYS Performed at [facility], [address] [place], [place] [address]    Report Status 12/10/2024 FINAL  Final    Labs: CBC: Recent Labs  Lab 12/08/24 0333 12/09/24 0318 12/10/24 0346  WBC 10.7* 9.5 10.4  NEUTROABS 8.5*  7.4 8.2*  HGB 10.5* 10.5* 11.1*  HCT 30.1* 30.8* 32.2*  MCV 90.4 92.5 91.5  PLT 308 311 323   Basic Metabolic Panel: Recent Labs  Lab 12/08/24 0333 12/09/24 0318 12/10/24 0346  NA 138 138 136  K 3.8 3.9 3.9  CL 105 105 103  CO2 25 24 25   GLUCOSE 97 98 108*  BUN 9 9 8   CREATININE 0.80 0.86 0.86  CALCIUM  8.0* 7.7* 7.8*  MG 2.1 2.1 2.1   Liver Function Tests: No results for input(s): AST, ALT, ALKPHOS, BILITOT, PROT, ALBUMIN in the last 168 hours. CBG: No results for input(s): GLUCAP in the last 168 hours.  Discharge time spent: greater than 30 minutes.  Signed: Nydia Distance, MD [facility] 12/14/2024 "

## 2024-12-14 NOTE — Progress Notes (Signed)
 "          [facility], is a 69 y.o. female, DOB - 1956/08/30, FMW:[id number] Admit date - 12/03/2024    Outpatient Primary MD for the patient is Larnell Hamilton, MD  LOS - 10  days  Chief Complaint  Patient presents with   Altered Mental Status       Brief summary   Patient is a 69 year old female wheelchair-bound with a history of MS for 40 years, initially stable however progressively getting worse over the last 10 years, chronic bilateral weakness,  impaired mobility, spasticity, anxiety/depression, anemia, overactive bladder, CKD stage IIIa, hypothyroidism, hypertension presented from SNF for altered mental status.  Per EMS, patient had been screaming in pain and had not been responding to the staff as usual since the evening prior to admission.  Patient received pain medication and Ativan  without any improvement so EMS was called. On EMS arrival, patient was noted to have decreased respirations to around 6-7 and.  Of apnea.  2 mg of Narcan was administered with improvement.  Patient's sister reported that she had foul-smelling urine and seems to be in pain.  Otherwise at baseline patient is alert and oriented x 3.  :CT A/P with no acute intra-abdominal pathology. CT head with no acute intracranial abnormality. CTA chest PE study negative for PE. She was found to have UTI and started on empiric antibiotics. Patient admitted for further evaluation   Awaiting SNF  Assessment & Plan    Acute metabolic encephalopathy -In the setting of chronic MS, worsened due to pain and UTI -CT head without any acute intracranial abnormalities.   - Ammonia level, LFTs within normal limits, troponins flat. - Alert and oriented, appears close to her baseline.    UTI with acute cystitis: -History of prior UTIs, urine culture showed more than 100,000 colonies of Enterococcus, lactobacillus. -Started  on empiric treatment with IV Rocephin , susceptibilities to ampicillin  and vancomycin. - Completed 5 days of linezolid .     AKI on CKD 3A: Creatinine slightly elevated to 1.5 on admission, now back closer to baseline of 1.0-1.2.  -Creatinine stable   Essential HTN: - BP stable  History of multiple sclerosis with impaired mobility, spasticity - Fall precautions - PT recommended SNF.   - Continue baclofen , tramadol  PRN for pain  Hypothyroidism - Continue synthroid  50 mcg daily, TSH 4.5  Overactive bladder - Continue oxybutynin  10 mg nightly  Hyperlipidemia - Continue Crestor  20 mg daily    Anxiety/depression - Continue fluoxetine    Estimated body mass index is 27.99 kg/m as calculated from the following:   Height as of this encounter: 5' (1.524 m).   Weight as of this encounter: 65 kg.  Code Status: Full code DVT Prophylaxis:  enoxaparin  (LOVENOX ) injection 40 mg Start: 12/10/24 1000   Level of Care: Level of care: Med-Surg Family Communication: Updated patient Disposition Plan:      Remains inpatient appropriate: Awaiting SNF   Procedures:    Consultants:     Antimicrobials:   Anti-infectives (From admission, onward)  Start     Dose/Rate Route Frequency Ordered Stop   12/06/24 2200  linezolid  (ZYVOX ) tablet 600 mg        600 mg Oral Every 12 hours 12/06/24 1213 12/10/24 2112   12/06/24 0145  linezolid  (ZYVOX ) IVPB 600 mg  Status:  Discontinued        600 mg 300 mL/hr over 60 Minutes Intravenous Every 12 hours 12/06/24 0056 12/06/24 1213   12/05/24 2300  Ampicillin -Sulbactam (UNASYN ) 3 g in sodium chloride  0.9 % 100 mL IVPB  Status:  Discontinued        3 g 200 mL/hr over 30 Minutes Intravenous Every 8 hours 12/05/24 2208 12/06/24 0055   12/04/24 2200  cefTRIAXone  (ROCEPHIN ) 1 g in sodium chloride  0.9 % 100 mL IVPB  Status:  Discontinued        1 g 200 mL/hr over 30 Minutes Intravenous Every 24 hours 12/04/24 0148 12/05/24 2208   12/03/24 2230   cefTRIAXone  (ROCEPHIN ) 2 g in sodium chloride  0.9 % 100 mL IVPB        2 g 200 mL/hr over 30 Minutes Intravenous  Once 12/03/24 2217 12/03/24 2311          Medications  baclofen   30 mg Oral TID   Chlorhexidine  Gluconate Cloth  6 each Topical Daily   cholecalciferol   1,000 Units Oral BID   enoxaparin  (LOVENOX ) injection  40 mg Subcutaneous Q24H   feeding supplement  237 mL Oral BID BM   FLUoxetine   60 mg Oral q AM   levothyroxine   50 mcg Oral Q0600   oxybutynin   10 mg Oral QHS   rosuvastatin   20 mg Oral QPM   cyanocobalamin   500 mcg Oral q AM      Subjective:   Mary Villanueva was seen and examined today.  Awaiting SNF.  Has some pain in the right knee otherwise no acute issues.  Fairly alert and oriented  Objective:   Vitals:   12/13/24 1700 12/13/24 2113 12/14/24 0319 12/14/24 0804  BP: (!) 96/56 (!) 99/51 (!) 102/46 (!) 110/55  Pulse: 83 85 82 80  Resp: 18 18 18 18   Temp:  98.9 F (37.2 C) 98.2 F (36.8 C) 98.6 F (37 C)  TempSrc:    Oral  SpO2: 99% 96% 100% 100%  Weight:      Height:        Intake/Output Summary (Last 24 hours) at 12/14/2024 1245 Last data filed at 12/14/2024 0600 Gross per 24 hour  Intake 100 ml  Output 375 ml  Net -275 ml     Wt Readings from Last 3 Encounters:  12/12/24 65 kg  10/29/24 65.8 kg  10/28/24 65.8 kg    Physical Exam General: Alert and oriented x 3, NAD Cardiovascular: S1 S2 clear, RRR.  Respiratory: CTAB, no wheezing Gastrointestinal: Soft, nontender, nondistended, NBS Ext: no pedal edema bilaterally Psych: Normal affect    Data Reviewed:  I have personally reviewed following labs    CBC Lab Results  Component Value Date   WBC 10.4 12/10/2024   RBC 3.52 (L) 12/10/2024   HGB 11.1 (L) 12/10/2024   HCT 32.2 (L) 12/10/2024   MCV 91.5 12/10/2024   MCH 31.5 12/10/2024   PLT 323 12/10/2024   MCHC 34.5 12/10/2024   RDW 12.6 12/10/2024   LYMPHSABS 1.1 12/10/2024   MONOABS 0.8 12/10/2024   EOSABS 0.2  12/10/2024   BASOSABS 0.0 12/10/2024     Last metabolic panel Lab Results  Component Value Date  NA 136 12/10/2024   K 3.9 12/10/2024   CL 103 12/10/2024   CO2 25 12/10/2024   BUN 8 12/10/2024   CREATININE 0.86 12/10/2024   GLUCOSE 108 (H) 12/10/2024   GFRNONAA >60 12/10/2024   GFRAA 58 (L) 08/05/2020   CALCIUM  7.8 (L) 12/10/2024   PHOS 3.9 12/04/2024   PROT 6.8 12/03/2024   ALBUMIN 3.7 12/03/2024   LABGLOB 2.2 06/02/2018   AGRATIO 2.0 06/02/2018   BILITOT 0.4 12/03/2024   ALKPHOS 73 12/03/2024   AST 32 12/03/2024   ALT 24 12/03/2024   ANIONGAP 8 12/10/2024    CBG (last 3)  No results for input(s): GLUCAP in the last 72 hours.    Coagulation Profile: No results for input(s): INR, PROTIME in the last 168 hours.   Radiology Studies: I have personally reviewed the imaging studies  No results found.     Nydia Distance M.D. [facility] 12/14/2024, 12:45 PM  Available via Epic secure chat 7am-7pm After 7 pm, please refer to night coverage provider listed on amion.    "
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
